# Patient Record
Sex: Female | Born: 1960 | Race: Black or African American | Hispanic: No | Marital: Single | Smoking: Never smoker
Health system: Southern US, Community
[De-identification: ages and names within clinical notes are randomized; demographics above are authoritative.]

## PROBLEM LIST (undated history)

## (undated) DIAGNOSIS — R17 Unspecified jaundice: Secondary | ICD-10-CM

## (undated) DIAGNOSIS — I341 Nonrheumatic mitral (valve) prolapse: Secondary | ICD-10-CM

## (undated) DIAGNOSIS — M797 Fibromyalgia: Secondary | ICD-10-CM

## (undated) DIAGNOSIS — Z973 Presence of spectacles and contact lenses: Secondary | ICD-10-CM

## (undated) DIAGNOSIS — K219 Gastro-esophageal reflux disease without esophagitis: Secondary | ICD-10-CM

## (undated) DIAGNOSIS — N950 Postmenopausal bleeding: Secondary | ICD-10-CM

## (undated) DIAGNOSIS — Z87442 Personal history of urinary calculi: Secondary | ICD-10-CM

## (undated) DIAGNOSIS — I2089 Other forms of angina pectoris: Secondary | ICD-10-CM

## (undated) DIAGNOSIS — M1990 Unspecified osteoarthritis, unspecified site: Secondary | ICD-10-CM

## (undated) DIAGNOSIS — I059 Rheumatic mitral valve disease, unspecified: Secondary | ICD-10-CM

## (undated) DIAGNOSIS — R011 Cardiac murmur, unspecified: Secondary | ICD-10-CM

## (undated) DIAGNOSIS — F419 Anxiety disorder, unspecified: Secondary | ICD-10-CM

## (undated) DIAGNOSIS — E785 Hyperlipidemia, unspecified: Secondary | ICD-10-CM

## (undated) DIAGNOSIS — Z972 Presence of dental prosthetic device (complete) (partial): Secondary | ICD-10-CM

## (undated) DIAGNOSIS — I10 Essential (primary) hypertension: Secondary | ICD-10-CM

## (undated) DIAGNOSIS — D649 Anemia, unspecified: Secondary | ICD-10-CM

## (undated) DIAGNOSIS — I208 Other forms of angina pectoris: Secondary | ICD-10-CM

## (undated) DIAGNOSIS — N858 Other specified noninflammatory disorders of uterus: Secondary | ICD-10-CM

## (undated) DIAGNOSIS — M21969 Unspecified acquired deformity of unspecified lower leg: Secondary | ICD-10-CM

## (undated) DIAGNOSIS — Z9889 Other specified postprocedural states: Secondary | ICD-10-CM

## (undated) HISTORY — PX: FOOT BONE EXCISION: SUR493

## (undated) HISTORY — PX: FOOT SURGERY: SHX648

## (undated) HISTORY — DX: Hyperlipidemia, unspecified: E78.5

## (undated) HISTORY — PX: DIAGNOSTIC LAPAROSCOPY: SUR761

## (undated) HISTORY — PX: COLONOSCOPY: SHX174

## (undated) HISTORY — DX: Cardiac murmur, unspecified: R01.1

---
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
## 1998-12-02 ENCOUNTER — Ambulatory Visit (HOSPITAL_COMMUNITY): Admission: RE | Admit: 1998-12-02 | Discharge: 1998-12-02 | Payer: Self-pay | Admitting: Neurosurgery

## 1998-12-21 ENCOUNTER — Encounter: Payer: Self-pay | Admitting: Neurosurgery

## 1998-12-21 ENCOUNTER — Ambulatory Visit (HOSPITAL_COMMUNITY): Admission: RE | Admit: 1998-12-21 | Discharge: 1998-12-21 | Payer: Self-pay | Admitting: Neurosurgery

## 1998-12-22 ENCOUNTER — Encounter: Payer: Self-pay | Admitting: Neurosurgery

## 1998-12-31 ENCOUNTER — Encounter: Admission: RE | Admit: 1998-12-31 | Discharge: 1999-03-31 | Payer: Self-pay | Admitting: Neurosurgery

## 1999-02-01 ENCOUNTER — Encounter: Admission: RE | Admit: 1999-02-01 | Discharge: 1999-02-01 | Payer: Self-pay | Admitting: Neurosurgery

## 1999-02-01 ENCOUNTER — Encounter: Payer: Self-pay | Admitting: Neurosurgery

## 1999-03-03 ENCOUNTER — Emergency Department (HOSPITAL_COMMUNITY): Admission: EM | Admit: 1999-03-03 | Discharge: 1999-03-03 | Payer: Self-pay | Admitting: Emergency Medicine

## 1999-04-15 ENCOUNTER — Encounter: Admission: RE | Admit: 1999-04-15 | Discharge: 1999-05-28 | Payer: Self-pay | Admitting: Neurosurgery

## 1999-05-01 ENCOUNTER — Emergency Department (HOSPITAL_COMMUNITY): Admission: EM | Admit: 1999-05-01 | Discharge: 1999-05-01 | Payer: Self-pay | Admitting: Emergency Medicine

## 1999-05-01 ENCOUNTER — Encounter: Payer: Self-pay | Admitting: Emergency Medicine

## 1999-05-23 ENCOUNTER — Encounter: Payer: Self-pay | Admitting: Nephrology

## 1999-05-23 ENCOUNTER — Encounter: Admission: RE | Admit: 1999-05-23 | Discharge: 1999-05-23 | Payer: Self-pay | Admitting: Nephrology

## 1999-06-06 ENCOUNTER — Encounter: Admission: RE | Admit: 1999-06-06 | Discharge: 1999-06-14 | Payer: Self-pay | Admitting: Specialist

## 2000-03-31 ENCOUNTER — Ambulatory Visit (HOSPITAL_BASED_OUTPATIENT_CLINIC_OR_DEPARTMENT_OTHER): Admission: RE | Admit: 2000-03-31 | Discharge: 2000-03-31 | Payer: Self-pay | Admitting: Orthopedic Surgery

## 2000-06-06 ENCOUNTER — Ambulatory Visit (HOSPITAL_COMMUNITY): Admission: RE | Admit: 2000-06-06 | Discharge: 2000-06-06 | Payer: Self-pay | Admitting: Neurosurgery

## 2000-06-06 ENCOUNTER — Encounter: Payer: Self-pay | Admitting: Neurosurgery

## 2000-07-06 ENCOUNTER — Encounter: Admission: RE | Admit: 2000-07-06 | Discharge: 2000-07-06 | Payer: Self-pay | Admitting: Neurosurgery

## 2000-07-06 ENCOUNTER — Encounter: Payer: Self-pay | Admitting: Neurosurgery

## 2000-07-20 ENCOUNTER — Encounter: Admission: RE | Admit: 2000-07-20 | Discharge: 2000-07-20 | Payer: Self-pay | Admitting: Neurosurgery

## 2000-07-20 ENCOUNTER — Encounter: Payer: Self-pay | Admitting: Neurosurgery

## 2000-09-17 ENCOUNTER — Ambulatory Visit (HOSPITAL_COMMUNITY): Admission: RE | Admit: 2000-09-17 | Discharge: 2000-09-17 | Payer: Self-pay | Admitting: Obstetrics

## 2000-09-17 ENCOUNTER — Encounter: Payer: Self-pay | Admitting: Obstetrics

## 2000-10-09 ENCOUNTER — Encounter: Payer: Self-pay | Admitting: Nephrology

## 2000-10-09 ENCOUNTER — Encounter: Admission: RE | Admit: 2000-10-09 | Discharge: 2000-10-09 | Payer: Self-pay | Admitting: Nephrology

## 2002-02-24 ENCOUNTER — Encounter: Admission: RE | Admit: 2002-02-24 | Discharge: 2002-02-24 | Payer: Self-pay | Admitting: *Deleted

## 2003-02-03 ENCOUNTER — Ambulatory Visit (HOSPITAL_COMMUNITY): Admission: RE | Admit: 2003-02-03 | Discharge: 2003-02-03 | Payer: Self-pay | Admitting: Cardiology

## 2003-02-21 ENCOUNTER — Ambulatory Visit: Admission: RE | Admit: 2003-02-21 | Discharge: 2003-02-21 | Payer: Self-pay | Admitting: Cardiology

## 2003-02-21 ENCOUNTER — Encounter (INDEPENDENT_AMBULATORY_CARE_PROVIDER_SITE_OTHER): Payer: Self-pay | Admitting: Cardiology

## 2005-07-29 ENCOUNTER — Encounter: Admission: RE | Admit: 2005-07-29 | Discharge: 2005-09-22 | Payer: Self-pay | Admitting: Orthopedic Surgery

## 2005-09-23 ENCOUNTER — Encounter: Admission: RE | Admit: 2005-09-23 | Discharge: 2005-09-23 | Payer: Self-pay | Admitting: Orthopedic Surgery

## 2005-10-14 ENCOUNTER — Encounter: Admission: RE | Admit: 2005-10-14 | Discharge: 2006-01-12 | Payer: Self-pay | Admitting: Orthopedic Surgery

## 2006-01-13 ENCOUNTER — Encounter: Admission: RE | Admit: 2006-01-13 | Discharge: 2006-03-03 | Payer: Self-pay | Admitting: Orthopedic Surgery

## 2007-11-24 ENCOUNTER — Encounter: Admission: RE | Admit: 2007-11-24 | Discharge: 2007-11-24 | Payer: Self-pay | Admitting: Gastroenterology

## 2008-01-14 HISTORY — PX: SHOULDER SURGERY: SHX246

## 2009-01-13 HISTORY — PX: JOINT REPLACEMENT: SHX530

## 2009-01-13 HISTORY — PX: CLOSED MANIPULATION KNEE WITH STERIOD INJECTION: SHX5610

## 2009-10-23 ENCOUNTER — Inpatient Hospital Stay (HOSPITAL_COMMUNITY): Admission: RE | Admit: 2009-10-23 | Discharge: 2009-10-27 | Payer: Self-pay | Admitting: Orthopedic Surgery

## 2009-11-20 ENCOUNTER — Encounter
Admission: RE | Admit: 2009-11-20 | Discharge: 2009-12-22 | Payer: Self-pay | Source: Home / Self Care | Attending: Orthopedic Surgery | Admitting: Orthopedic Surgery

## 2010-01-01 ENCOUNTER — Ambulatory Visit (HOSPITAL_COMMUNITY)
Admission: RE | Admit: 2010-01-01 | Discharge: 2010-01-01 | Payer: Self-pay | Source: Home / Self Care | Attending: Orthopedic Surgery | Admitting: Orthopedic Surgery

## 2010-01-02 ENCOUNTER — Encounter
Admission: RE | Admit: 2010-01-02 | Discharge: 2010-01-11 | Payer: Self-pay | Source: Home / Self Care | Attending: Orthopedic Surgery | Admitting: Orthopedic Surgery

## 2010-01-03 ENCOUNTER — Encounter
Admission: RE | Admit: 2010-01-03 | Discharge: 2010-02-12 | Disposition: A | Discharge status: Rehabilitation Facility | Payer: Self-pay | Source: Home / Self Care | Attending: Orthopedic Surgery | Admitting: Orthopedic Surgery

## 2010-01-13 HISTORY — PX: KNEE ARTHROPLASTY: SHX992

## 2010-01-22 ENCOUNTER — Inpatient Hospital Stay (HOSPITAL_COMMUNITY)
Admission: RE | Admit: 2010-01-22 | Discharge: 2010-01-24 | Payer: Self-pay | Source: Home / Self Care | Attending: Orthopedic Surgery | Admitting: Orthopedic Surgery

## 2010-01-24 ENCOUNTER — Encounter (INDEPENDENT_AMBULATORY_CARE_PROVIDER_SITE_OTHER): Payer: Self-pay | Admitting: Orthopedic Surgery

## 2010-01-24 ENCOUNTER — Encounter: Admit: 2010-01-24 | Payer: Self-pay | Admitting: Orthopedic Surgery

## 2010-01-28 ENCOUNTER — Encounter: Admit: 2010-01-28 | Payer: Self-pay | Admitting: Orthopedic Surgery

## 2010-01-28 LAB — BASIC METABOLIC PANEL
BUN: 14 mg/dL (ref 6–23)
CO2: 27 mEq/L (ref 19–32)
Calcium: 9.5 mg/dL (ref 8.4–10.5)
Chloride: 107 mEq/L (ref 96–112)
Creatinine, Ser: 0.65 mg/dL (ref 0.4–1.2)
GFR calc Af Amer: >60 mL/min (ref >60)
GFR calc non Af Amer: >60 mL/min (ref >60)
Glucose, Bld: 92 mg/dL (ref 70–99)
Potassium: 3.4 mEq/L — ABNORMAL LOW (ref 3.5–5.1)
Sodium: 139 mEq/L (ref 135–145)

## 2010-01-28 LAB — PROTIME-INR
INR: 1.04 (ref 0.00–1.49)
INR: 1.12 (ref 0.00–1.49)
INR: 1.87 — ABNORMAL HIGH (ref 0.00–1.49)
Prothrombin Time: 13.8 seconds (ref 11.6–15.2)
Prothrombin Time: 14.6 seconds (ref 11.6–15.2)
Prothrombin Time: 21.7 seconds — ABNORMAL HIGH (ref 11.6–15.2)

## 2010-01-28 LAB — TYPE AND SCREEN
ABO/RH(D): O POS
Antibody Screen: NEGATIVE

## 2010-02-01 ENCOUNTER — Encounter: Admit: 2010-02-01 | Payer: Self-pay | Admitting: Orthopedic Surgery

## 2010-02-13 ENCOUNTER — Ambulatory Visit: Payer: Medicaid Other | Attending: Orthopedic Surgery | Admitting: Physical Therapy

## 2010-02-13 DIAGNOSIS — M25569 Pain in unspecified knee: Secondary | ICD-10-CM | POA: Insufficient documentation

## 2010-02-13 DIAGNOSIS — R269 Unspecified abnormalities of gait and mobility: Secondary | ICD-10-CM | POA: Insufficient documentation

## 2010-02-13 DIAGNOSIS — IMO0001 Reserved for inherently not codable concepts without codable children: Secondary | ICD-10-CM | POA: Insufficient documentation

## 2010-02-13 DIAGNOSIS — M25669 Stiffness of unspecified knee, not elsewhere classified: Secondary | ICD-10-CM | POA: Insufficient documentation

## 2010-02-14 ENCOUNTER — Ambulatory Visit: Payer: Medicaid Other | Admitting: Physical Therapy

## 2010-02-15 ENCOUNTER — Ambulatory Visit: Payer: Medicaid Other | Admitting: Physical Therapy

## 2010-02-18 ENCOUNTER — Ambulatory Visit: Payer: Medicaid Other | Admitting: Physical Therapy

## 2010-02-19 ENCOUNTER — Ambulatory Visit: Payer: Medicaid Other | Admitting: Physical Therapy

## 2010-02-21 ENCOUNTER — Ambulatory Visit: Payer: Medicaid Other | Admitting: Physical Therapy

## 2010-02-21 NOTE — Discharge Summary (Signed)
  NAMEJOLA, [name] NO.:  [id number]  MEDICAL RECORD NO.:  [id number]          PATIENT TYPE:  INP  LOCATION:  5004                         FACILITY:  [facility]  PHYSICIAN:  Burnard Bunting, M.D.    DATE OF BIRTH:  03/30/1960  DATE OF ADMISSION:  01/22/2010 DATE OF DISCHARGE:  01/24/2010                              DISCHARGE SUMMARY   DISCHARGE DIAGNOSIS:  Right knee arthrofibrosis.  PROCEDURES:  Right knee synovectomy.  Open synovectomy performed on December 2012.  HOSPITAL COURSE:  Sandra Church is a 50 year old patient with right knee replacement, arthrofibrosis, underwent open synovectomy.  She tolerated the procedure well without immediate complications.  She was started on Coumadin for DVT prophylaxis and postsurgery, she made good progress with physical therapy and CPM machine.  Discharged home in good condition on January 24, 2010.  Doppler was negative for DVT at that time.  DISCHARGE MEDICATIONS: 1. Lyrica 100 mg p.o. daily. 2. Lisinopril 20 mg p.o. daily. 3. Ferrous sulfate 1 tablet daily. 4. Metoprolol 100 mg p.o. q. evening. 5. Omeprazole 40 mg p.o. daily. 6. Aspirin 325 by mouth daily. 7. Hydrocodone 1 tablet as needed for pain q.4-6 h. 8. Robaxin 500 mg p.o. q.8 h. as needed for muscle spasm.  She will not go home on Coumadin, but she is ambulating, weightbearing as tolerated, and her ultrasound was negative.  She will follow up with me in 7 days for suture removal.     Burnard Bunting, M.D.     GSD/MEDQ  D:  02/13/2010  T:  02/14/2010  Job:  [id number]  Electronically Signed by Reece Agar.  Alfonse Garringer M.D. on 02/21/2010 09:00:38 AM

## 2010-02-25 ENCOUNTER — Ambulatory Visit: Payer: Medicaid Other | Admitting: Physical Therapy

## 2010-02-27 ENCOUNTER — Ambulatory Visit: Payer: Medicaid Other | Admitting: Physical Therapy

## 2010-02-27 ENCOUNTER — Ambulatory Visit: Payer: Self-pay | Admitting: Physical Therapy

## 2010-03-01 ENCOUNTER — Ambulatory Visit: Payer: Medicaid Other | Admitting: Physical Therapy

## 2010-03-04 ENCOUNTER — Ambulatory Visit: Payer: Medicaid Other | Admitting: Physical Therapy

## 2010-03-06 ENCOUNTER — Ambulatory Visit: Payer: Medicaid Other | Admitting: Physical Therapy

## 2010-03-06 ENCOUNTER — Ambulatory Visit: Payer: Self-pay | Admitting: Physical Therapy

## 2010-03-08 ENCOUNTER — Ambulatory Visit: Payer: Medicaid Other | Admitting: Physical Therapy

## 2010-03-11 ENCOUNTER — Ambulatory Visit: Payer: Medicaid Other | Admitting: Physical Therapy

## 2010-03-13 ENCOUNTER — Ambulatory Visit: Payer: Medicaid Other | Admitting: Physical Therapy

## 2010-03-15 ENCOUNTER — Encounter: Payer: Self-pay | Admitting: Physical Therapy

## 2010-03-15 ENCOUNTER — Ambulatory Visit: Payer: Self-pay | Admitting: Physical Therapy

## 2010-03-18 ENCOUNTER — Encounter: Payer: Self-pay | Admitting: Physical Therapy

## 2010-03-20 ENCOUNTER — Encounter: Payer: Self-pay | Admitting: Physical Therapy

## 2010-03-25 LAB — CBC
HCT: 37.2 % (ref 36.0–46.0)
Hemoglobin: 12 g/dL (ref 12.0–15.0)
MCH: 28.2 pg (ref 26.0–34.0)
MCHC: 32.3 g/dL (ref 30.0–36.0)
MCV: 87.3 fL (ref 78.0–100.0)
Platelets: 492 10*3/uL — ABNORMAL HIGH (ref 150–400)
RBC: 4.26 MIL/uL (ref 3.87–5.11)
RDW: 15.4 % (ref 11.5–15.5)
WBC: 7.9 10*3/uL (ref 4.0–10.5)

## 2010-03-25 LAB — BASIC METABOLIC PANEL WITH GFR
BUN: 14 mg/dL (ref 6–23)
CO2: 27 meq/L (ref 19–32)
Calcium: 9.5 mg/dL (ref 8.4–10.5)
Chloride: 104 meq/L (ref 96–112)
Creatinine, Ser: 0.72 mg/dL (ref 0.4–1.2)
GFR calc non Af Amer: >60 mL/min
Glucose, Bld: 96 mg/dL (ref 70–99)
Potassium: 4.1 meq/L (ref 3.5–5.1)
Sodium: 138 meq/L (ref 135–145)

## 2010-03-25 LAB — HEPATIC FUNCTION PANEL
ALT: 11 U/L (ref 0–35)
AST: 20 U/L (ref 0–37)
Albumin: 3.6 g/dL (ref 3.5–5.2)
Alkaline Phosphatase: 78 U/L (ref 39–117)
Bilirubin, Direct: 0.1 mg/dL (ref 0.0–0.3)
Indirect Bilirubin: 0.4 mg/dL (ref 0.3–0.9)
Total Bilirubin: 0.5 mg/dL (ref 0.3–1.2)
Total Protein: 6.4 g/dL (ref 6.0–8.3)

## 2010-03-25 LAB — APTT: aPTT: 32 seconds (ref 24–37)

## 2010-03-27 LAB — PROTIME-INR
INR: 2.51 — ABNORMAL HIGH (ref 0.00–1.49)
Prothrombin Time: 27.2 seconds — ABNORMAL HIGH (ref 11.6–15.2)

## 2010-03-28 LAB — BASIC METABOLIC PANEL
BUN: 4 mg/dL — ABNORMAL LOW (ref 6–23)
CO2: 27 mEq/L (ref 19–32)
CO2: 30 mEq/L (ref 19–32)
Calcium: 8.4 mg/dL (ref 8.4–10.5)
Calcium: 9.1 mg/dL (ref 8.4–10.5)
Calcium: 9.7 mg/dL (ref 8.4–10.5)
Creatinine, Ser: 0.62 mg/dL (ref 0.4–1.2)
Creatinine, Ser: 0.65 mg/dL (ref 0.4–1.2)
Creatinine, Ser: 0.69 mg/dL (ref 0.4–1.2)
GFR calc Af Amer: >60 mL/min (ref >60)
GFR calc Af Amer: >60 mL/min (ref >60)
GFR calc non Af Amer: >60 mL/min (ref >60)
GFR calc non Af Amer: >60 mL/min (ref >60)
Glucose, Bld: 92 mg/dL (ref 70–99)

## 2010-03-28 LAB — URINE CULTURE
Colony Count: NO GROWTH
Culture  Setup Time: 201110061708

## 2010-03-28 LAB — PROTIME-INR
INR: 0.98 (ref 0.00–1.49)
INR: 1.27 (ref 0.00–1.49)
Prothrombin Time: 13.2 seconds (ref 11.6–15.2)
Prothrombin Time: 21.6 seconds — ABNORMAL HIGH (ref 11.6–15.2)

## 2010-03-28 LAB — CBC
HCT: 28.7 % — ABNORMAL LOW (ref 36.0–46.0)
HCT: 29.4 % — ABNORMAL LOW (ref 36.0–46.0)
Hemoglobin: 9.9 g/dL — ABNORMAL LOW (ref 12.0–15.0)
MCH: 31.4 pg (ref 26.0–34.0)
MCH: 31.6 pg (ref 26.0–34.0)
MCHC: 33.6 g/dL (ref 30.0–36.0)
MCV: 93 fL (ref 78.0–100.0)
MCV: 94.4 fL (ref 78.0–100.0)
MCV: 94.4 fL (ref 78.0–100.0)
Platelets: 310 10*3/uL (ref 150–400)
Platelets: 332 10*3/uL (ref 150–400)
Platelets: 402 10*3/uL — ABNORMAL HIGH (ref 150–400)
RBC: 3.04 MIL/uL — ABNORMAL LOW (ref 3.87–5.11)
RBC: 3.16 MIL/uL — ABNORMAL LOW (ref 3.87–5.11)
RBC: 3.38 MIL/uL — ABNORMAL LOW (ref 3.87–5.11)
RDW: 14.8 % (ref 11.5–15.5)
WBC: 14.7 10*3/uL — ABNORMAL HIGH (ref 4.0–10.5)
WBC: 14.8 10*3/uL — ABNORMAL HIGH (ref 4.0–10.5)
WBC: 15.4 10*3/uL — ABNORMAL HIGH (ref 4.0–10.5)

## 2010-03-28 LAB — URINALYSIS, ROUTINE W REFLEX MICROSCOPIC
Bilirubin Urine: NEGATIVE
Bilirubin Urine: NEGATIVE
Ketones, ur: NEGATIVE mg/dL
Ketones, ur: NEGATIVE mg/dL
Nitrite: NEGATIVE
Protein, ur: NEGATIVE mg/dL
Protein, ur: NEGATIVE mg/dL
Urobilinogen, UA: 1 mg/dL (ref 0.0–1.0)
pH: 7 (ref 5.0–8.0)

## 2010-03-28 LAB — SURGICAL PCR SCREEN: Staphylococcus aureus: POSITIVE — AB

## 2010-03-28 LAB — DIFFERENTIAL
Basophils Absolute: 0.1 10*3/uL (ref 0.0–0.1)
Basophils Relative: 1 % (ref 0–1)
Eosinophils Absolute: 0.4 10*3/uL (ref 0.0–0.7)
Eosinophils Relative: 3 % (ref 0–5)
Lymphocytes Relative: 16 % (ref 12–46)
Lymphs Abs: 2.5 10*3/uL (ref 0.7–4.0)
Monocytes Absolute: 1.4 10*3/uL — ABNORMAL HIGH (ref 0.1–1.0)
Neutro Abs: 11.1 10*3/uL — ABNORMAL HIGH (ref 1.7–7.7)
Neutrophils Relative %: 55 % (ref 43–77)

## 2010-03-28 LAB — TYPE AND SCREEN
ABO/RH(D): O POS
Antibody Screen: NEGATIVE

## 2010-03-28 LAB — ABO/RH: ABO/RH(D): O POS

## 2010-05-31 NOTE — Op Note (Signed)
. [facility]  Patient:    Sandra Church, Sandra Church                  MRN: [id number] Proc. Date: 03/31/00 Adm. Date:  [id number] Attending:  Twana First                           Operative Report  PREOPERATIVE DIAGNOSIS: Right knee medial and lateral meniscal tears.  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  OPERATION/PROCEDURE:  1. Right knee examination under anesthesia followed by arthroscopic partial     medial and lateral meniscectomy.  2. Right knee chondroplasty.  SURGEON: Elana Alm. Thurston Hole, M.D.  ASSISTANT: Kirstin A. Shepperson, P.A.  ANESTHESIA: Local and MAC.  OPERATIVE TIME: Thirty minutes.  COMPLICATIONS: None.  INDICATIONS FOR PROCEDURE: Sandra Church is a 50 year old woman who has had three months of increasing right knee pain, with signs and symptoms consistent with medial and lateral meniscal tears, documented by MRI, who has failed conservative care and is now to undergo arthroscopy.  DESCRIPTION OF PROCEDURE: Sandra Church was brought to the operating room on March 31, 2000 and placed on the operating room table in the supine position. A block had been placed in the holding room.  Her right knee was examined under anesthesia and range of motion was 0-125 degrees, with 1-2+ crepitation noted.  The knee was stable to ligamentous examination with normal patella tracking.  The right leg was prepped using sterile Betadine and draped using sterile technique.  Originally through an inferolateral portal the arthroscope with a pump attached was placed and through an inferomedial portal an arthroscopic probe was placed.  On initial inspection of the medial compartment the articular cartilage and medial femoral condyle showed a central grade 3 chondral defect of over 50% of the medial femoral condyle, which was thoroughly debrided down to a stable base and a stable rim.  The medial  tibial plateau showed moderate grade 2 and 30% grade 3 chondromalacia, which was debrided.  The medial meniscus was probed and there was small partial tearing noted, which was debrided 15%; otherwise, it was intact.  The intercondylar notch was inspected and the anterior and posterior cruciate ligaments were normal.  The lateral compartment was inspected and grade 3 chondromalacia over 50-60% was noted in the lateral femoral condyle and 80% of the lateral tibial plateau, which was thoroughly debrided.  She had complex tearing of the posterior and lateral horn and lateral meniscus, of which 50-60% was resected back to a stable rim.  The patellofemoral joint was inspected and grade 3 chondromalacia was noted of over 25% of the patella, which was debrided; otherwise, grade 1-2 changes noted on the rest of the patella and the femoral groove.  The patella tracked normally.  Moderate synovitis in the medial and lateral gutters was debrided, otherwise they were free of pathology.  After this was done it was felt all pathology had been satisfactorily addressed.  Instruments were removed and the portals closed with 3-0 Nylon suture and injected with 0.25% Marcaine with epinephrine and 4 mg morphine.  A sterile dressing was applied and the patient taken to the recovery room in stable condition.  FOLLOW-UP CARE: Sandra Church will be followed as an outpatient on Vicodin and Naprosyn and Keflex and I will see her back in the office in a week for suture removal and follow-up. DD:  03/31/00 TD:  04/01/00 Job: [id number]  [id number]

## 2010-11-04 ENCOUNTER — Other Ambulatory Visit (HOSPITAL_COMMUNITY): Payer: Self-pay | Admitting: Obstetrics and Gynecology

## 2010-11-04 DIAGNOSIS — Z1231 Encounter for screening mammogram for malignant neoplasm of breast: Secondary | ICD-10-CM

## 2010-11-25 ENCOUNTER — Ambulatory Visit (HOSPITAL_COMMUNITY)
Admission: RE | Admit: 2010-11-25 | Discharge: 2010-11-25 | Disposition: A | Discharge status: Home / Self Care | Payer: Medicaid Other | Source: Ambulatory Visit | Attending: Obstetrics and Gynecology | Admitting: Obstetrics and Gynecology

## 2010-11-25 DIAGNOSIS — Z1231 Encounter for screening mammogram for malignant neoplasm of breast: Secondary | ICD-10-CM

## 2010-12-18 ENCOUNTER — Other Ambulatory Visit: Payer: Self-pay | Admitting: Cardiology

## 2011-10-14 ENCOUNTER — Emergency Department (HOSPITAL_COMMUNITY): Payer: Medicaid Other

## 2011-10-14 ENCOUNTER — Emergency Department (HOSPITAL_COMMUNITY)
Admission: EM | Admit: 2011-10-14 | Discharge: 2011-10-14 | Disposition: A | Discharge status: Home / Self Care | Payer: Medicaid Other | Attending: Emergency Medicine | Admitting: Emergency Medicine

## 2011-10-14 ENCOUNTER — Encounter (HOSPITAL_COMMUNITY): Payer: Self-pay | Admitting: Family Medicine

## 2011-10-14 DIAGNOSIS — I10 Essential (primary) hypertension: Secondary | ICD-10-CM | POA: Insufficient documentation

## 2011-10-14 DIAGNOSIS — Y92009 Unspecified place in unspecified non-institutional (private) residence as the place of occurrence of the external cause: Secondary | ICD-10-CM | POA: Insufficient documentation

## 2011-10-14 DIAGNOSIS — W2203XA Walked into furniture, initial encounter: Secondary | ICD-10-CM | POA: Insufficient documentation

## 2011-10-14 DIAGNOSIS — S92919A Unspecified fracture of unspecified toe(s), initial encounter for closed fracture: Secondary | ICD-10-CM | POA: Insufficient documentation

## 2011-10-14 DIAGNOSIS — S8990XA Unspecified injury of unspecified lower leg, initial encounter: Secondary | ICD-10-CM | POA: Insufficient documentation

## 2011-10-14 HISTORY — DX: Fibromyalgia: M79.7

## 2011-10-14 HISTORY — DX: Rheumatic mitral valve disease, unspecified: I05.9

## 2011-10-14 HISTORY — DX: Unspecified osteoarthritis, unspecified site: M19.90

## 2011-10-14 HISTORY — DX: Essential (primary) hypertension: I10

## 2011-10-14 MED ORDER — HYDROCODONE-ACETAMINOPHEN 5-500 MG PO TABS: 1.0000 | ORAL_TABLET | Freq: Four times a day (QID) | ORAL | Status: DC | PRN

## 2011-10-14 MED ORDER — HYDROCODONE-ACETAMINOPHEN 5-325 MG PO TABS
2.0000 | ORAL_TABLET | Freq: Once | ORAL | Status: AC
  Administered 2011-10-14: 2 via ORAL
  Filled 2011-10-14: qty 2

## 2011-10-14 NOTE — ED Provider Notes (Signed)
History     CSN: [id number]  Arrival date & time 10/14/11  2049   First MD Initiated Contact with Patient 10/14/11 2237      Chief Complaint  Patient presents with  . Foot Injury    (Consider location/radiation/quality/duration/timing/severity/associated sxs/prior treatment) Patient is a 51 y.o. female presenting with foot injury. The history is provided by the patient.  Foot Injury  Pertinent negatives include no numbness.  pt c/o left 4th toe injury tonight at home. Was walking, says left foot slid out hitting leg of table. C/o constant, dull, non radiating pain since, worse w palpation. No associated numbness. Skin intact. Denies other injury. Is ambulatory.      Past Medical History  Diagnosis Date  . Fibromyalgia   . Arthritis   . Hypertension   . Mitral valve problem     Past Surgical History  Procedure Date  . Foot surgery     plantar fascitis  . Joint replacement   . Shoulder surgery     History reviewed. No pertinent family history.  History  Substance Use Topics  . Smoking status: Never Smoker   . Smokeless tobacco: Not on file  . Alcohol Use: No    OB History    Grav Para Term Preterm Abortions TAB SAB Ect Mult Living                  Review of Systems  Constitutional: Negative for fever.  Skin: Negative for wound.  Neurological: Negative for numbness.    Allergies  Shrimp and Codeine  Home Medications   Current Outpatient Rx  Name Route Sig Dispense Refill  . ASPIRIN-SALICYLAMIDE-CAFFEINE 295-284-13 MG PO PACK Oral Take 1 packet by mouth every 8 (eight) hours as needed. For pain    . LISINOPRIL 5 MG PO TABS Oral Take 5 mg by mouth daily.    Marland Kitchen METOPROLOL SUCCINATE ER 100 MG PO TB24 Oral Take 100 mg by mouth daily. Take with or immediately following a meal.    . PREGABALIN 150 MG PO CAPS Oral Take 150 mg by mouth 3 (three) times daily.      BP 124/73  Pulse 74  Temp 97.7 F (36.5 C) (Oral)  Resp 20  SpO2 100%  Physical Exam    Nursing note and vitals reviewed. Constitutional: She appears well-developed and well-nourished. No distress.  Eyes: Conjunctivae normal are normal. No scleral icterus.  Neck: Neck supple. No tracheal deviation present.  Cardiovascular: Normal rate.   Pulmonary/Chest: Effort normal. No respiratory distress.  Abdominal: Normal appearance.  Musculoskeletal:       sts abd tenderness left 4th toe. Skin intact. Normal cap refill distally. No other focal bony tenderness on foot and ankle exam. Dp/pt 2+.  Neurological: She is alert.  Skin: Skin is warm and dry. No rash noted.  Psychiatric: She has a normal mood and affect.    ED Course  Procedures (including critical care time)  Labs Reviewed - No data to display Dg Foot Complete Left  10/14/2011  *RADIOLOGY REPORT*  Clinical Data: Foot injury with fourth toe pain.  LEFT FOOT - COMPLETE 3+ VIEW  Comparison: None.  Findings: Oblique fracture is identified through the proximal phalanx of the fourth toe.  This is not appear to extend to an articular surface in the mid and.  Deformity of the little toe proximal phalangeal head suggests old trauma.  There is mild hallux valgus deformity.  IMPRESSION: Oblique fracture involving the proximal phalanx of the fourth toe.  Original Report Authenticated By: ERIC A. MANSELL, M.D.        MDM  No meds pta, pt has ride, does not have to drive.  vicodin po.  Ice.   Post op shoe.  Discussed xrays w pt.         Suzi Roots, MD 10/14/11 2251

## 2011-10-14 NOTE — ED Notes (Signed)
Pt states she was walking in her hallway and lost her balance and left foot went out from under her and ran into metal table. Small bruised area noted above left 4th toe. Reports pain to that area. CMS intact.

## 2012-07-01 ENCOUNTER — Encounter (HOSPITAL_COMMUNITY): Payer: Self-pay | Admitting: Emergency Medicine

## 2012-07-01 ENCOUNTER — Emergency Department (HOSPITAL_COMMUNITY): Payer: Medicaid Other

## 2012-07-01 ENCOUNTER — Emergency Department (HOSPITAL_COMMUNITY)
Admission: EM | Admit: 2012-07-01 | Discharge: 2012-07-01 | Disposition: A | Discharge status: Home / Self Care | Payer: Medicaid Other | Attending: Emergency Medicine | Admitting: Emergency Medicine

## 2012-07-01 DIAGNOSIS — S61209A Unspecified open wound of unspecified finger without damage to nail, initial encounter: Secondary | ICD-10-CM | POA: Insufficient documentation

## 2012-07-01 DIAGNOSIS — Y9289 Other specified places as the place of occurrence of the external cause: Secondary | ICD-10-CM | POA: Insufficient documentation

## 2012-07-01 DIAGNOSIS — S66909A Unspecified injury of unspecified muscle, fascia and tendon at wrist and hand level, unspecified hand, initial encounter: Secondary | ICD-10-CM | POA: Insufficient documentation

## 2012-07-01 DIAGNOSIS — S62339A Displaced fracture of neck of unspecified metacarpal bone, initial encounter for closed fracture: Secondary | ICD-10-CM | POA: Insufficient documentation

## 2012-07-01 DIAGNOSIS — I059 Rheumatic mitral valve disease, unspecified: Secondary | ICD-10-CM | POA: Insufficient documentation

## 2012-07-01 DIAGNOSIS — Y9389 Activity, other specified: Secondary | ICD-10-CM | POA: Insufficient documentation

## 2012-07-01 DIAGNOSIS — S61412A Laceration without foreign body of left hand, initial encounter: Secondary | ICD-10-CM

## 2012-07-01 DIAGNOSIS — IMO0001 Reserved for inherently not codable concepts without codable children: Secondary | ICD-10-CM | POA: Insufficient documentation

## 2012-07-01 DIAGNOSIS — Z79899 Other long term (current) drug therapy: Secondary | ICD-10-CM | POA: Insufficient documentation

## 2012-07-01 DIAGNOSIS — I10 Essential (primary) hypertension: Secondary | ICD-10-CM | POA: Insufficient documentation

## 2012-07-01 DIAGNOSIS — S61409A Unspecified open wound of unspecified hand, initial encounter: Secondary | ICD-10-CM | POA: Insufficient documentation

## 2012-07-01 DIAGNOSIS — Z8739 Personal history of other diseases of the musculoskeletal system and connective tissue: Secondary | ICD-10-CM | POA: Insufficient documentation

## 2012-07-01 DIAGNOSIS — S62309A Unspecified fracture of unspecified metacarpal bone, initial encounter for closed fracture: Secondary | ICD-10-CM

## 2012-07-01 DIAGNOSIS — W208XXA Other cause of strike by thrown, projected or falling object, initial encounter: Secondary | ICD-10-CM | POA: Insufficient documentation

## 2012-07-01 MED ORDER — OXYCODONE-ACETAMINOPHEN 5-325 MG PO TABS: 1.0000 | ORAL_TABLET | ORAL | Status: DC | PRN

## 2012-07-01 MED ORDER — CEPHALEXIN 500 MG PO CAPS: 500.0000 mg | ORAL_CAPSULE | Freq: Four times a day (QID) | ORAL | Status: DC

## 2012-07-01 MED ORDER — TETANUS-DIPHTH-ACELL PERTUSSIS 5-2.5-18.5 LF-MCG/0.5 IM SUSP
0.5000 mL | Freq: Once | INTRAMUSCULAR | Status: AC
  Administered 2012-07-01: 0.5 mL via INTRAMUSCULAR
  Filled 2012-07-01: qty 0.5

## 2012-07-01 MED ORDER — OXYCODONE-ACETAMINOPHEN 5-325 MG PO TABS
2.0000 | ORAL_TABLET | Freq: Once | ORAL | Status: AC
  Administered 2012-07-01: 2 via ORAL
  Filled 2012-07-01: qty 2

## 2012-07-01 NOTE — ED Notes (Signed)
Blunt trauma to l/hand -5th finger. Open laceration, muscle exposed. Incident one hour ago. Slow bleeding. Pressure and ice applied.TV dropped from wall bracket -approx. 8 foot drop onto back of hand

## 2012-07-01 NOTE — ED Notes (Signed)
Patient transported to X-ray 

## 2012-07-01 NOTE — ED Provider Notes (Signed)
History  This chart was scribed for Dierdre Forth, PA-C working with Lyanne Co, MD by Ardelia Mems, ED Scribe. This patient was seen in room WTR5/WTR5 and the patient's care was started at 3:25 PM.   CSN: [id number]  Arrival date & time 07/01/12  1432      Chief Complaint  Patient presents with  . Extremity Laceration    blunt trauma to l/hand-5th finger  . Finger Injury    deep laceration to 5th finger l/hand  . Bleeding/Bruising    slow bleed from laceration     HPI  HPI Comments: Sandra Church is a 52 y.o. female with a hx of arthritis and HTN who presents to the Emergency Department complaining of injuries onset after a TV fell on her left hand about 2 hours ago. Pt is right-handed. Pt states that she was lying on the floor in a camper, when a TV mounted on the wall fell about 8 feet onto the dorsal aspect of her left hand. Pt complains of constant, moderate pain with associated swelling in the 3rd, 4th and 5th digits of her left hand. Pt states that the impact of the TV on her hand damaged a ring she was wearing on her ring finger, and she had to cut off the ring. Pt has lacerations in the webspace of her 4th and 5th digits and over the PIP joint of her 4th finger, with initial, slow bleeding which is under control currently. Pt also states that the impact dislocated her 5th metacarpal, which she relocated afterwards. Pt states that she has iced the hand with some relief of swelling. Pt has not tried any home medications for pain, but received Oxycodone soon after arrival and reports relief of pain. Pt denies wrist pain, fever, chills, sweating, numbness or any other symptoms.   PCP- Dr. Lerry Liner   Past Medical History  Diagnosis Date  . Fibromyalgia   . Arthritis   . Hypertension   . Mitral valve problem     Past Surgical History  Procedure Laterality Date  . Foot surgery      plantar fascitis  . Joint replacement    . Shoulder surgery       Family History  Problem Relation Age of Onset  . Diabetes Mother   . Stroke Mother   . Cancer Father   . Hypertension Father     History  Substance Use Topics  . Smoking status: Never Smoker   . Smokeless tobacco: Not on file  . Alcohol Use: No    OB History   Grav Para Term Preterm Abortions TAB SAB Ect Mult Living                  Review of Systems  Constitutional: Negative for fever and chills.  Musculoskeletal:       Denies wrist pain.  Neurological: Negative for numbness.   A complete 10 system review of systems was obtained and all systems are negative except as noted in the HPI and PMH.    Allergies  Shrimp and Codeine  Home Medications   Current Outpatient Rx  Name  Route  Sig  Dispense  Refill  . Aspirin-Salicylamide-Caffeine (BC FAST PAIN RELIEF ARTHRITIS) [id number] MG PACK   Oral   Take 1 packet by mouth every 8 (eight) hours as needed. For pain         . lisinopril (PRINIVIL,ZESTRIL) 5 MG tablet   Oral   Take 10 mg by mouth 2 (  two) times daily.          . metoprolol succinate (TOPROL-XL) 100 MG 24 hr tablet   Oral   Take 100 mg by mouth every morning. Take with or immediately following a meal.         . pregabalin (LYRICA) 100 MG capsule   Oral   Take 100 mg by mouth 3 (three) times daily.         . cephALEXin (KEFLEX) 500 MG capsule   Oral   Take 1 capsule (500 mg total) by mouth 4 (four) times daily.   40 capsule   0   . oxyCODONE-acetaminophen (PERCOCET/ROXICET) 5-325 MG per tablet   Oral   Take 1-2 tablets by mouth every 4 (four) hours as needed for pain.   21 tablet   0     Triage Vitals: BP 146/86  Pulse 87  Temp(Src) 97.9 F (36.6 C) (Oral)  Resp 20  SpO2 100%  Physical Exam  Nursing note and vitals reviewed. Constitutional: She is oriented to person, place, and time. She appears well-developed and well-nourished. No distress.  HENT:  Head: Normocephalic and atraumatic.  Mouth/Throat: Oropharynx is clear  and moist.  Eyes: Conjunctivae are normal. Pupils are equal, round, and reactive to light. No scleral icterus.  Neck: Normal range of motion.  Cardiovascular: Normal rate, regular rhythm, normal heart sounds and intact distal pulses.   No murmur heard. Pulses:      Radial pulses are 2+ on the right side, and 2+ on the left side.  Capillary refill > 3 in all 5 digits of the L hand  Pulmonary/Chest: Effort normal and breath sounds normal. No respiratory distress. She has no wheezes.  Musculoskeletal: She exhibits no edema.       Left hand: She exhibits decreased range of motion, tenderness, bony tenderness, deformity, laceration and swelling. She exhibits normal two-point discrimination and normal capillary refill. Normal sensation noted.  LEFT HAND: Limited ROM in 5th, 4th, 3rd digits. Full ROM of left wrist. Radial and ulnar nerve are intact. Inability to extend against resistance with the left little finger   Neurological: She is alert and oriented to person, place, and time. She exhibits normal muscle tone. Coordination normal. GCS eye subscore is 4. GCS verbal subscore is 5. GCS motor subscore is 6.  Skin: Skin is warm and dry. She is not diaphoretic. No erythema.  LEFT HAND: 3 cm laceration over PIP joint of 4th finger. 2.5 cm laceration in webspace bt 4th and 5th digits. Hematoma and erythema along dorsal aspect of hand, with a large amount of swelling.  Psychiatric: She has a normal mood and affect.    ED Course  LACERATION REPAIR Date/Time: 07/01/2012 3:30 PM Performed by: Dierdre Forth Authorized by: Dierdre Forth Consent: Verbal consent obtained. Risks and benefits: risks, benefits and alternatives were discussed Consent given by: patient Patient understanding: patient states understanding of the procedure being performed Patient consent: the patient's understanding of the procedure matches consent given Procedure consent: procedure consent matches procedure  scheduled Relevant documents: relevant documents present and verified Site marked: the operative site was marked Imaging studies: imaging studies available Required items: required blood products, implants, devices, and special equipment available Patient identity confirmed: verbally with patient and arm band Time out: Immediately prior to procedure a "time out" was called to verify the correct patient, procedure, equipment, support staff and site/side marked as required. Body area: upper extremity Location details: left small finger Laceration length: 3 cm Foreign bodies:  no foreign bodies Tendon involvement: complex (complete extensor tendon rupture) Nerve involvement: none Vascular damage: no Anesthesia: digital block Local anesthetic: lidocaine 1% without epinephrine Anesthetic total: 5 ml Patient sedated: no Preparation: Patient was prepped and draped in the usual sterile fashion. Irrigation solution: saline and tap water Irrigation method: syringe and tap Amount of cleaning: extensive Debridement: minimal Wound skin closure material used: 5-0 vicryl rapide. Number of sutures: 7 Technique: running Approximation: close Approximation difficulty: complex Dressing: 4x4 sterile gauze Patient tolerance: Patient tolerated the procedure well with no immediate complications.  LACERATION REPAIR Date/Time: 07/01/2012 4:00 PM Performed by: Dierdre Forth Authorized by: Dierdre Forth Consent: Verbal consent obtained. Risks and benefits: risks, benefits and alternatives were discussed Consent given by: patient Patient understanding: patient states understanding of the procedure being performed Patient consent: the patient's understanding of the procedure matches consent given Procedure consent: procedure consent matches procedure scheduled Relevant documents: relevant documents present and verified Site marked: the operative site was marked Imaging studies: imaging  studies available Required items: required blood products, implants, devices, and special equipment available Patient identity confirmed: verbally with patient and arm band Time out: Immediately prior to procedure a "time out" was called to verify the correct patient, procedure, equipment, support staff and site/side marked as required. Body area: upper extremity Location details: left hand Laceration length: 2.5 cm Foreign bodies: no foreign bodies Tendon involvement: none Nerve involvement: none Vascular damage: no Anesthesia: local infiltration Local anesthetic: lidocaine 1% without epinephrine Anesthetic total: 3 ml Patient sedated: no Irrigation solution: saline and tap water Irrigation method: syringe and tap Amount of cleaning: extensive Debridement: minimal Degree of undermining: none Wound skin closure material used: 5-0 vicryl rapide. Number of sutures: 4 Technique: simple Approximation: close Approximation difficulty: complex Dressing: 4x4 sterile gauze Patient tolerance: Patient tolerated the procedure well with no immediate complications.   (including critical care time)  DIAGNOSTIC STUDIES: Oxygen Saturation is 100% on RA, normal by my interpretation.    COORDINATION OF CARE: 3:43 PM- Pt advised of plan for treatment and pt agrees.  Medications  oxyCODONE-acetaminophen (PERCOCET/ROXICET) 5-325 MG per tablet 2 tablet (2 tablets Oral Given 07/01/12 1527)  TDaP (BOOSTRIX) injection 0.5 mL (0.5 mLs Intramuscular Given 07/01/12 1719)     Labs Reviewed - No data to display Dg Hand Complete Left  07/01/2012   *RADIOLOGY REPORT*  Clinical Data: Recent traumatic injury with pain  LEFT HAND - COMPLETE 3+ VIEW  Comparison: None.  Findings: A mildly displaced distal fifth metacarpal fracture is seen.  Soft tissue injury is noted in the midportion of the fifth digit.  Generalized swelling is noted.  No other fracture is seen.  IMPRESSION: Fifth metacarpal fracture with  associated soft tissue swelling as well as soft tissue defect in the fifth finger consistent with known history.   Original Report Authenticated By: Alcide Clever, M.D.     1. Laceration of left hand involving tendon   2. Fracture of metacarpal of left hand, closed, initial encounter       MDM  Sandra Church presents with traumatic injury to the L hand.  X-ray with Fifth metacarpal fracture.  I personally reviewed the imaging tests through PACS system.  I reviewed available ER/hospitalization records through the EMR.  Exam with evidence of complete disruption of the extensor tendon in the left little finger, but no obvious involvement of the joint capsule.  Discussed with Dr Merlyn Lot who recommends repair of the laceration and follow-up in the office tomorrow.  Tdap booster  given. Pressure irrigation performed. Laceration occurred < 8 hours prior to repair which was well tolerated.  Pt has no co morbidities to effect normal wound healing. Discussed suture home care w pt and answered questions. Pt to f-u tomorrow with Merlyn Lot for extensor tendon repair.  Pt is hemodynamically stable w no complaints.  Fracture splinted in the department.  I have also discussed reasons to return immediately to the ER.  Patient expresses understanding and agrees with plan.   I personally performed the services described in this documentation, which was scribed in my presence. The recorded information has been reviewed and is accurate.   Dahlia Client Sayde Lish, PA-C 07/01/12 2046  Dierdre Forth, PA-C 07/01/12 2223

## 2012-07-01 NOTE — ED Provider Notes (Signed)
Medical screening examination/treatment/procedure(s) were conducted as a shared visit with non-physician practitioner(s) and myself.  I personally evaluated the patient during the encounter  NERVE BLOCK Performed by: Lyanne Co Consent: Verbal consent obtained. Required items: required blood products, implants, devices, and special equipment available Time out: Immediately prior to procedure a "time out" was called to verify the correct patient, procedure, equipment, support staff and site/side marked as required. Indication: laceration repair, finger exploration Nerve block body site: Digital nerves of left little finger  Preparation: Patient was prepped and draped in the usual sterile fashion. Needle gauge: 24 G Location technique: anatomical landmarks Local anesthetic: Lidocaine 1%  Anesthetic total: 4 ml Outcome: pain improved Patient tolerance: Patient tolerated the procedure well with no immediate complications.  Patient with evidence of extensor tendon laceration.  Patient will followup with hand surgery.  Wounds closed.  Home with antibiotics.  It does not have appear to involve the PIP joint    Lyanne Co, MD 07/01/12 [phone]

## 2012-07-01 NOTE — ED Notes (Signed)
PA at bedside 1600-1700 for evaluation, suturing, reassessment by EDP and PA

## 2012-07-02 ENCOUNTER — Encounter (HOSPITAL_BASED_OUTPATIENT_CLINIC_OR_DEPARTMENT_OTHER): Payer: Self-pay | Admitting: *Deleted

## 2012-07-02 ENCOUNTER — Other Ambulatory Visit: Payer: Self-pay | Admitting: Orthopedic Surgery

## 2012-07-02 NOTE — Progress Notes (Signed)
Pt to come in for [id number]-[id number]-sees dr harwani-called for notes-manages her htn

## 2012-07-02 NOTE — Progress Notes (Signed)
07/02/12 1415  OBSTRUCTIVE SLEEP APNEA  Have you ever been diagnosed with sleep apnea through a sleep study? No  Do you snore loudly (loud enough to be heard through closed doors)?  1  Do you often feel tired, fatigued, or sleepy during the daytime? 0  Has anyone observed you stop breathing during your sleep? 0  Do you have, or are you being treated for high blood pressure? 1  BMI more than 35 kg/m2? 1  Age over 54 years old? 1  Gender: 0  Obstructive Sleep Apnea Score 4

## 2012-07-05 ENCOUNTER — Encounter (HOSPITAL_BASED_OUTPATIENT_CLINIC_OR_DEPARTMENT_OTHER)
Admission: RE | Admit: 2012-07-05 | Discharge: 2012-07-05 | Disposition: A | Discharge status: Home / Self Care | Payer: Medicaid Other | Source: Ambulatory Visit | Attending: Orthopedic Surgery | Admitting: Orthopedic Surgery

## 2012-07-05 LAB — BASIC METABOLIC PANEL
CO2: 28 mEq/L (ref 19–32)
Calcium: 9.5 mg/dL (ref 8.4–10.5)
Chloride: 101 mEq/L (ref 96–112)
Creatinine, Ser: 0.71 mg/dL (ref 0.50–1.10)
Glucose, Bld: 64 mg/dL — ABNORMAL LOW (ref 70–99)
Sodium: 137 mEq/L (ref 135–145)

## 2012-07-06 ENCOUNTER — Encounter (HOSPITAL_BASED_OUTPATIENT_CLINIC_OR_DEPARTMENT_OTHER)
Admission: RE | Disposition: A | Discharge status: Home / Self Care | Payer: Self-pay | Source: Ambulatory Visit | Attending: Orthopedic Surgery

## 2012-07-06 ENCOUNTER — Encounter (HOSPITAL_BASED_OUTPATIENT_CLINIC_OR_DEPARTMENT_OTHER): Payer: Self-pay | Admitting: Orthopedic Surgery

## 2012-07-06 ENCOUNTER — Ambulatory Visit (HOSPITAL_BASED_OUTPATIENT_CLINIC_OR_DEPARTMENT_OTHER)
Admission: RE | Admit: 2012-07-06 | Discharge: 2012-07-06 | Disposition: A | Discharge status: Home / Self Care | Payer: Medicaid Other | Source: Ambulatory Visit | Attending: Orthopedic Surgery | Admitting: Orthopedic Surgery

## 2012-07-06 ENCOUNTER — Encounter (HOSPITAL_BASED_OUTPATIENT_CLINIC_OR_DEPARTMENT_OTHER): Payer: Self-pay | Admitting: Certified Registered Nurse Anesthetist

## 2012-07-06 ENCOUNTER — Ambulatory Visit (HOSPITAL_BASED_OUTPATIENT_CLINIC_OR_DEPARTMENT_OTHER): Payer: Medicaid Other | Admitting: Certified Registered Nurse Anesthetist

## 2012-07-06 DIAGNOSIS — W208XXA Other cause of strike by thrown, projected or falling object, initial encounter: Secondary | ICD-10-CM | POA: Insufficient documentation

## 2012-07-06 DIAGNOSIS — Z885 Allergy status to narcotic agent status: Secondary | ICD-10-CM | POA: Insufficient documentation

## 2012-07-06 DIAGNOSIS — I10 Essential (primary) hypertension: Secondary | ICD-10-CM | POA: Insufficient documentation

## 2012-07-06 DIAGNOSIS — IMO0001 Reserved for inherently not codable concepts without codable children: Secondary | ICD-10-CM | POA: Insufficient documentation

## 2012-07-06 DIAGNOSIS — S62339A Displaced fracture of neck of unspecified metacarpal bone, initial encounter for closed fracture: Secondary | ICD-10-CM | POA: Insufficient documentation

## 2012-07-06 DIAGNOSIS — M129 Arthropathy, unspecified: Secondary | ICD-10-CM | POA: Insufficient documentation

## 2012-07-06 DIAGNOSIS — S61209A Unspecified open wound of unspecified finger without damage to nail, initial encounter: Secondary | ICD-10-CM | POA: Insufficient documentation

## 2012-07-06 DIAGNOSIS — Z91013 Allergy to seafood: Secondary | ICD-10-CM | POA: Insufficient documentation

## 2012-07-06 HISTORY — PX: OPEN REDUCTION INTERNAL FIXATION (ORIF) METACARPAL: SHX6234

## 2012-07-06 HISTORY — PX: REPAIR EXTENSOR TENDON: SHX5382

## 2012-07-06 HISTORY — DX: Presence of spectacles and contact lenses: Z97.3

## 2012-07-06 LAB — POCT HEMOGLOBIN-HEMACUE: Hemoglobin: 13.4 g/dL (ref 12.0–15.0)

## 2012-07-06 SURGERY — OPEN REDUCTION INTERNAL FIXATION (ORIF) METACARPAL
Anesthesia: Regional | Site: Hand | Laterality: Left | Wound class: Contaminated

## 2012-07-06 MED ORDER — CHLORHEXIDINE GLUCONATE 4 % EX LIQD: 60.0000 mL | Freq: Once | CUTANEOUS | Status: DC

## 2012-07-06 MED ORDER — FENTANYL CITRATE 0.05 MG/ML IJ SOLN
50.0000 ug | INTRAMUSCULAR | Status: DC | PRN
  Administered 2012-07-06: 50 ug via INTRAVENOUS

## 2012-07-06 MED ORDER — MIDAZOLAM HCL 2 MG/2ML IJ SOLN
1.0000 mg | INTRAMUSCULAR | Status: DC | PRN
  Administered 2012-07-06: 1 mg via INTRAVENOUS

## 2012-07-06 MED ORDER — ONDANSETRON HCL 4 MG/2ML IJ SOLN
INTRAMUSCULAR | Status: DC | PRN
  Administered 2012-07-06: 4 mg via INTRAVENOUS

## 2012-07-06 MED ORDER — ACETAMINOPHEN 10 MG/ML IV SOLN: 1000.0000 mg | Freq: Once | INTRAVENOUS | Status: DC | PRN

## 2012-07-06 MED ORDER — CEFAZOLIN SODIUM-DEXTROSE 2-3 GM-% IV SOLR
2.0000 g | INTRAVENOUS | Status: AC
  Administered 2012-07-06: 2 g via INTRAVENOUS

## 2012-07-06 MED ORDER — BUPIVACAINE HCL (PF) 0.25 % IJ SOLN
INTRAMUSCULAR | Status: DC | PRN
  Administered 2012-07-06: 10 mL

## 2012-07-06 MED ORDER — ONDANSETRON HCL 4 MG/2ML IJ SOLN: 4.0000 mg | Freq: Once | INTRAMUSCULAR | Status: DC | PRN

## 2012-07-06 MED ORDER — FENTANYL CITRATE 0.05 MG/ML IJ SOLN
INTRAMUSCULAR | Status: DC | PRN
  Administered 2012-07-06 (×3): 25 ug via INTRAVENOUS

## 2012-07-06 MED ORDER — OXYCODONE-ACETAMINOPHEN 5-325 MG PO TABS: ORAL_TABLET | ORAL | Status: DC

## 2012-07-06 MED ORDER — 0.9 % SODIUM CHLORIDE (POUR BTL) OPTIME
TOPICAL | Status: DC | PRN
  Administered 2012-07-06: 1000 mL

## 2012-07-06 MED ORDER — LIDOCAINE HCL (CARDIAC) 20 MG/ML IV SOLN
INTRAVENOUS | Status: DC | PRN
  Administered 2012-07-06: 30 mg via INTRAVENOUS

## 2012-07-06 MED ORDER — MIDAZOLAM HCL 5 MG/5ML IJ SOLN
INTRAMUSCULAR | Status: DC | PRN
  Administered 2012-07-06: 1 mg via INTRAVENOUS

## 2012-07-06 MED ORDER — DEXAMETHASONE SODIUM PHOSPHATE 10 MG/ML IJ SOLN
INTRAMUSCULAR | Status: DC | PRN
  Administered 2012-07-06: 10 mg via INTRAVENOUS

## 2012-07-06 MED ORDER — HYDROMORPHONE HCL PF 1 MG/ML IJ SOLN: 0.2500 mg | INTRAMUSCULAR | Status: DC | PRN

## 2012-07-06 MED ORDER — PROPOFOL 10 MG/ML IV BOLUS
INTRAVENOUS | Status: DC | PRN
  Administered 2012-07-06: 200 mg via INTRAVENOUS

## 2012-07-06 MED ORDER — LACTATED RINGERS IV SOLN
INTRAVENOUS | Status: DC
  Administered 2012-07-06 (×2): via INTRAVENOUS

## 2012-07-06 SURGICAL SUPPLY — 98 items
BAG DECANTER FOR FLEXI CONT (MISCELLANEOUS) IMPLANT
BALL CTTN LRG ABS STRL LF (GAUZE/BANDAGES/DRESSINGS)
BANDAGE CONFORM 2  STR LF (GAUZE/BANDAGES/DRESSINGS) IMPLANT
BANDAGE ELASTIC 3 VELCRO ST LF (GAUZE/BANDAGES/DRESSINGS) ×2 IMPLANT
BANDAGE GAUZE ELAST BULKY 4 IN (GAUZE/BANDAGES/DRESSINGS) ×2 IMPLANT
BIT DRILL 1.1 MINI QC NONSTRL (BIT) ×2 IMPLANT
BLADE MINI RND TIP GREEN BEAV (BLADE) IMPLANT
BLADE SURG 15 STRL LF DISP TIS (BLADE) ×2 IMPLANT
BLADE SURG 15 STRL SS (BLADE) ×4
BNDG CMPR 9X4 STRL LF SNTH (GAUZE/BANDAGES/DRESSINGS) ×1
BNDG CMPR MD 5X2 ELC HKLP STRL (GAUZE/BANDAGES/DRESSINGS)
BNDG ELASTIC 2 VLCR STRL LF (GAUZE/BANDAGES/DRESSINGS) IMPLANT
BNDG ESMARK 4X9 LF (GAUZE/BANDAGES/DRESSINGS) ×2 IMPLANT
CHLORAPREP W/TINT 26ML (MISCELLANEOUS) ×4 IMPLANT
CLOTH BEACON ORANGE TIMEOUT ST (SAFETY) ×2 IMPLANT
CORDS BIPOLAR (ELECTRODE) ×2 IMPLANT
COTTONBALL LRG STERILE PKG (GAUZE/BANDAGES/DRESSINGS) IMPLANT
COVER MAYO STAND STRL (DRAPES) ×2 IMPLANT
COVER TABLE BACK 60X90 (DRAPES) ×2 IMPLANT
CUFF TOURNIQUET SINGLE 18IN (TOURNIQUET CUFF) ×2 IMPLANT
DECANTER SPIKE VIAL GLASS SM (MISCELLANEOUS) IMPLANT
DRAIN TLS ROUND 10FR (DRAIN) IMPLANT
DRAPE EXTREMITY T 121X128X90 (DRAPE) ×2 IMPLANT
DRAPE OEC MINIVIEW 54X84 (DRAPES) ×4 IMPLANT
DRAPE SURG 17X23 STRL (DRAPES) ×2 IMPLANT
DRSG PAD ABDOMINAL 8X10 ST (GAUZE/BANDAGES/DRESSINGS) IMPLANT
GAUZE SPONGE 4X4 16PLY XRAY LF (GAUZE/BANDAGES/DRESSINGS) IMPLANT
GAUZE XEROFORM 1X8 LF (GAUZE/BANDAGES/DRESSINGS) ×2 IMPLANT
GLOVE BIO SURGEON STRL SZ7.5 (GLOVE) ×2 IMPLANT
GLOVE BIOGEL PI IND STRL 7.0 (GLOVE) ×3 IMPLANT
GLOVE BIOGEL PI IND STRL 8 (GLOVE) ×1 IMPLANT
GLOVE BIOGEL PI IND STRL 8.5 (GLOVE) ×1 IMPLANT
GLOVE BIOGEL PI INDICATOR 7.0 (GLOVE) ×3
GLOVE BIOGEL PI INDICATOR 8 (GLOVE) ×1
GLOVE BIOGEL PI INDICATOR 8.5 (GLOVE) ×1
GLOVE ECLIPSE 6.5 STRL STRAW (GLOVE) ×2 IMPLANT
GLOVE SURG ORTHO 8.0 STRL STRW (GLOVE) ×4 IMPLANT
GOWN BRE IMP PREV XXLGXLNG (GOWN DISPOSABLE) ×4 IMPLANT
GOWN PREVENTION PLUS XLARGE (GOWN DISPOSABLE) ×2 IMPLANT
GOWN PREVENTION PLUS XXLARGE (GOWN DISPOSABLE) ×4 IMPLANT
K-WIRE DBL TRONS .035X6 (WIRE) ×4
KWIRE 4.0 X .035IN (WIRE) IMPLANT
KWIRE DBL TRONS .035X6 (WIRE) ×2 IMPLANT
LOOP VESSEL MAXI BLUE (MISCELLANEOUS) IMPLANT
NEEDLE HYPO 22GX1.5 SAFETY (NEEDLE) IMPLANT
NEEDLE HYPO 25X1 1.5 SAFETY (NEEDLE) ×2 IMPLANT
NEEDLE KEITH (NEEDLE) IMPLANT
NS IRRIG 1000ML POUR BTL (IV SOLUTION) ×2 IMPLANT
PACK BASIN DAY SURGERY FS (CUSTOM PROCEDURE TRAY) ×2 IMPLANT
PAD CAST 3X4 CTTN HI CHSV (CAST SUPPLIES) ×1 IMPLANT
PAD CAST 4YDX4 CTTN HI CHSV (CAST SUPPLIES) ×1 IMPLANT
PADDING CAST ABS 3INX4YD NS (CAST SUPPLIES)
PADDING CAST ABS 4INX4YD NS (CAST SUPPLIES) ×1
PADDING CAST ABS COTTON 3X4 (CAST SUPPLIES) IMPLANT
PADDING CAST ABS COTTON 4X4 ST (CAST SUPPLIES) ×1 IMPLANT
PADDING CAST COTTON 3X4 STRL (CAST SUPPLIES) ×2
PADDING CAST COTTON 4X4 STRL (CAST SUPPLIES) ×2
PLATE LOCKING 1.5 Y SHAPE (Plate) ×2 IMPLANT
SCREW LOCKING 1.5X10 (Screw) ×2 IMPLANT
SCREW LOCKING 1.5X13MM (Screw) ×2 IMPLANT
SCREW NL 1.5X11 WRIST (Screw) ×2 IMPLANT
SCREW NONIOC 1.5 10M (Screw) ×6 IMPLANT
SCREW NONIOC 1.5 14M (Screw) ×2 IMPLANT
SLEEVE SCD COMPRESS KNEE MED (MISCELLANEOUS) ×2 IMPLANT
SPLINT PLASTER CAST XFAST 3X15 (CAST SUPPLIES) IMPLANT
SPLINT PLASTER CAST XFAST 4X15 (CAST SUPPLIES) IMPLANT
SPLINT PLASTER XTRA FAST SET 4 (CAST SUPPLIES)
SPLINT PLASTER XTRA FASTSET 3X (CAST SUPPLIES)
SPONGE GAUZE 4X4 12PLY (GAUZE/BANDAGES/DRESSINGS) ×2 IMPLANT
STOCKINETTE 4X48 STRL (DRAPES) ×2 IMPLANT
SUT CHROMIC 5 0 P 3 (SUTURE) IMPLANT
SUT ETHIBOND 3-0 V-5 (SUTURE) IMPLANT
SUT ETHILON 3 0 PS 1 (SUTURE) IMPLANT
SUT ETHILON 4 0 PS 2 18 (SUTURE) ×2 IMPLANT
SUT FIBERWIRE 3-0 18 TAPR NDL (SUTURE)
SUT FIBERWIRE 4-0 18 DIAM BLUE (SUTURE)
SUT MERSILENE 2.0 SH NDLE (SUTURE) IMPLANT
SUT MERSILENE 3 0 FS 1 (SUTURE) IMPLANT
SUT MERSILENE 4 0 P 3 (SUTURE) ×4 IMPLANT
SUT MON AB 5-0 P3 18 (SUTURE) ×4 IMPLANT
SUT POLY BUTTON 15MM (SUTURE) IMPLANT
SUT PROLENE 2 0 SH DA (SUTURE) IMPLANT
SUT PROLENE 6 0 P 1 18 (SUTURE) IMPLANT
SUT SILK 2 0 FS (SUTURE) IMPLANT
SUT SILK 4 0 PS 2 (SUTURE) IMPLANT
SUT STEEL 4 0 V 26 (SUTURE) IMPLANT
SUT VIC AB 3-0 PS1 18 (SUTURE)
SUT VIC AB 3-0 PS1 18XBRD (SUTURE) IMPLANT
SUT VIC AB 4-0 P-3 18XBRD (SUTURE) ×1 IMPLANT
SUT VIC AB 4-0 P3 18 (SUTURE) ×2
SUT VICRYL 4-0 PS2 18IN ABS (SUTURE) IMPLANT
SUTURE FIBERWR 3-0 18 TAPR NDL (SUTURE) IMPLANT
SUTURE FIBERWR 4-0 18 DIA BLUE (SUTURE) IMPLANT
SYR BULB 3OZ (MISCELLANEOUS) ×2 IMPLANT
SYR CONTROL 10ML LL (SYRINGE) ×2 IMPLANT
TOWEL OR 17X24 6PK STRL BLUE (TOWEL DISPOSABLE) ×4 IMPLANT
TUBE FEEDING 5FR 15 INCH (TUBING) IMPLANT
UNDERPAD 30X30 INCONTINENT (UNDERPADS AND DIAPERS) ×2 IMPLANT

## 2012-07-06 NOTE — Op Note (Signed)
[id number] 

## 2012-07-06 NOTE — Brief Op Note (Signed)
07/06/2012  3:33 PM  PATIENT:  Sandra Church  52 y.o. female  PRE-OPERATIVE DIAGNOSIS:  LEFT SMALL METACARPAL FRACTURE AND EXTENSOR TENDON LACERATION  POST-OPERATIVE DIAGNOSIS:  LEFT SMALL METACARPAL FRACTURE AND EXTENSOR TENDON LACERATION  PROCEDURE:  Procedure(s): LEFT HAND EXPLORATION WOUNDS, OPEN REDUCTION INTERNAL FIXATION SMALL METACARPAL FRACTURE REPAIR EXTENSOR TENDON  SURGEON:  Surgeon(s): Tami Ribas, MD Nicki Reaper, MD  PHYSICIAN ASSISTANT:   ASSISTANTS: Cindee Salt, MD   ANESTHESIA:   regional and general  EBL:  Total I/O In: 1200 [I.V.:1200] Out: -   DRAINS: none   LOCAL MEDICATIONS USED:  MARCAINE     SPECIMEN:  No Specimen  DISPOSITION OF SPECIMEN:  N/A  COUNTS:  YES  TOURNIQUET:   Total Tourniquet Time Documented: Upper Arm (Left) - 93 minutes Total: Upper Arm (Left) - 93 minutes   DICTATION: .Other Dictation: Dictation Number [id number]  PLAN OF CARE: Discharge to home after PACU

## 2012-07-06 NOTE — Progress Notes (Signed)
Assisted Dr. Joslin with left, ultrasound guided, supraclavicular block. Side rails up, monitors on throughout procedure. See vital signs in flow sheet. Tolerated Procedure well. 

## 2012-07-06 NOTE — Anesthesia Postprocedure Evaluation (Signed)
  Anesthesia Post-op Note  Patient: Sandra Church  Procedure(s) Performed: Procedure(s) with comments: LEFT HAND EXPLORATION WOUNDS, OPEN REDUCTION INTERNAL FIXATION SMALL METACARPAL FRACTURE (Left) REPAIR EXTENSOR TENDON (Left) - Left   Patient Location: PACU  Anesthesia Type:General and GA combined with regional for post-op pain  Level of Consciousness: awake, alert  and oriented  Airway and Oxygen Therapy: Patient Spontanous Breathing and Patient connected to nasal cannula oxygen  Post-op Pain: none  Post-op Assessment: Post-op Vital signs reviewed, Patient's Cardiovascular Status Stable, Respiratory Function Stable, Patent Airway and Pain level controlled  Post-op Vital Signs: stable  Complications: No apparent anesthesia complications

## 2012-07-06 NOTE — Op Note (Signed)
Sandra, Church NO.:  [id number]  MEDICAL RECORD NO.:  [id number]  LOCATION:  WTR5                         FACILITY:  [facility]  PHYSICIAN:  Betha Loa, MD        DATE OF BIRTH:  November 10, 1960  DATE OF PROCEDURE:  07/06/2012 DATE OF DISCHARGE:  07/06/2012                              OPERATIVE REPORT   PREOPERATIVE DIAGNOSES:  Left small finger, zone 3 extensor tendon laceration and small finger metacarpal neck fracture.  POSTOPERATIVE DIAGNOSES:  Left small finger, zone 3 extensor tendon laceration and small finger metacarpal neck fracture.  PROCEDURE:   1. Left small finger repair of zone 3 extensor tendon laceration 2. Exploration of palm wound  3. Open reduction internal fixation of small metacarpal neck fracture  SURGEON:  Betha Loa, MD  ASSISTANT:  Cindee Salt, MD  ANESTHESIA:  General with regional.  IV FLUIDS:  Per anesthesia flow sheet.  ESTIMATED BLOOD LOSS:  Minimal.  COMPLICATIONS:  None.  SPECIMENS:  None.  TOURNIQUET TIME:  1 hour 32 minutes.  DISPOSITION:  Stable to PACU.  INDICATIONS:  Sandra Church is a 52 year old female who last week was In her RV when a TV fell landing on her left hand.  She has been seen at [facility] Emergency Department where radiographs revealed a small finger metacarpal neck fracture.  She had lacerations on her hand, which were irrigated and debrided and sutured closed by the emergency department.  She was referred to me.  On examination, she had intact sensation and capillary refill in the fingertips.  She had weak extension of the small finger.  Radiographs showed a small finger metacarpal neck fracture.  I discussed with Sandra Church, the nature of the injuries.  Recommend exploration of the wounds with repair of extensor tendon laceration and closed reduction percutaneous pinning versus open reduction and internal fixation of the metacarpal fracture. Risks, benefits, and alternatives of the surgery  were discussed including risk of blood loss, infection, damage to nerves, vessels, tendons, ligaments, bone; failure of surgery; need for additional surgery, complications with wound healing, continued pain, nonunion, malunion, stiffness.  She voiced understanding of these risks and elected to proceed.  OPERATIVE COURSE:  After being identified preoperatively by myself, the patient and I agreed upon the procedure and site of the procedure. Surgical site was marked.  The risks, benefits, and alternatives of surgery were reviewed and she wished to proceed.  Surgical consent had been signed.  She was given IV Ancef as preoperative antibiotic prophylaxis.  Regional block was performed by Anesthesia in preoperative holding.  She was transported to the operating room and placed in the operating table in supine position in the left upper extremity arm board.  General anesthesia was induced by anesthesiologist.  The left upper extremity was prepped and draped in normal sterile orthopedic fashion.  Surgical pause was performed between surgeons, anesthesia, operating staff, and all were in agreement as to the patient, procedure, and site of procedure.  Tourniquet at the proximal aspect of the extremity was inflated to 250 mmHg after exsanguination of the limb with an Esmarch bandage.  The wound on the dorsum of the small finger was explored.  There was laceration  of the central slip of the tendon just proximal to the PIP joint.  There was a stump of the central slip attached to the middle phalanx remaining.  The wound was extended both proximally and distally to gain better visualization.  The wound was copiously irrigated.  The wound on the palmar aspect in the ring and small finger webspace was explored.  The radial digital nerve and vessel to the small finger were visualized and were intact.  The wound was copiously irrigated with sterile saline.  Attention was turned to the small finger  metacarpal fracture.  Incision was made on the dorsal ulnar side of the hand carried into subcutaneous tissues by spreading technique.  Bipolar electrocautery was used to obtain hemostasis.  The periosteum was incised.  The portion of the sagittal band had to be released for visualization.  The fracture site was identified.  It was impacted.  It was freed up and reduced.  It was held provisionally with a K-wire.  C-arm was used in AP, lateral, and oblique projections to ensure appropriate reduction which was the case.  A wide plate from the ALPS set was selected.  It was broken to provide appropriate fit.  It was provisionally stabilized with a K-wire.  Standard AO drilling and measuring technique was used.  Nonlocking screws were placed in the shaft of the plate and 2 locking screws were placed in the distal portions of the Y of the plate into the metacarpal head.  This provided good stabilization.  Good purchase was obtained overall, but 1 screw in which only adequate purchase was obtained.  The proximal screw hole had broken through when the plate was shortened.  The screw hole still filled as it provided adequate stabilization.  The C-arm was used in AP, lateral, and oblique projections to ensure appropriate reduction and position of the hardware, which was the case.  The wound was copiously irrigated with sterile saline.  The periosteum was repaired back over top of the plate using 4-0 Vicryl suture.  The sagittal band release was repaired with 4-0 Mersilene suture.  The 4-0 Vicryl was used in an interrupted fashion in the subcutaneous tissues of the skin and the skin was closed with a 5-0 Monocryl in a horizontal mattress fashion. Attention was turned to the extensor tendon laceration.  This was repaired using the 4-0 Mersilene suture.  Figure-of-eight technique was used.  The laceration was C-shaped.  Good apposition of the tendon ends was obtained.  The skin laceration on the  dorsum of the finger and in the palm was then repaired with the 5-0 Mersilene in a horizontal mattress fashion.  The wounds were injected with 10 mL of 0.25% plain Marcaine to aid in postoperative analgesia.  They were then dressed with sterile Xeroform, 4 x 4s, and wrapped with a Kerlix bandage.  A volar and dorsal slab splint including the long, ring, and small fingers was placed with the MPs flexed and the IPs extended.  This was wrapped with Kerlix and Ace bandage.  Tourniquet was deflated at 1 hour 32 minutes. Fingertips were pink with brisk capillary refill after deflation of the tourniquet.  Operative drapes were broken down.  The patient was awoken from anesthesia safely.  She was transferred back to stretcher and taken to PACU in stable condition.  I will see her back in the office in 1 week for postoperative followup.  I will give her Percocet 5/325, 1-2 p.o. q.6 h. p.r.n. pain, dispensed.    Caryn Bee  Merlyn Lot, MD    KK/MEDQ  D:  07/06/2012  T:  07/06/2012  Job:  [id number]

## 2012-07-06 NOTE — H&P (Signed)
Sandra Church is an 52 y.o. female.   Chief Complaint: left metacarpal fracture and extensor tendon laceration HPI: 52 yo rhd female states a tv fell onto left hand 07/01/12.  Seen at [facility] where XR revealed small finger metacarpal fracture.  Lacerations I&D'd by ER and closed.  Reported tendon laceration to small finger.  Reports no previous injury to the left hand and no other injury at this time.  Past Medical History  Diagnosis Date  . Fibromyalgia   . Arthritis   . Hypertension   . Mitral valve problem   . Wears glasses     Past Surgical History  Procedure Laterality Date  . Foot surgery      plantar fascitis  . Shoulder surgery  2010    rt  . Joint replacement  2011    rt total  . Closed manipulation knee with steriod injection  2011    rt  . Knee arthroplasty  2012    rt  . Foot bone excision      both feet-spurs  . Diagnostic laparoscopy      Family History  Problem Relation Age of Onset  . Diabetes Mother   . Stroke Mother   . Cancer Father   . Hypertension Father    Social History:  reports that she has never smoked. She does not have any smokeless tobacco history on file. She reports that she does not drink alcohol or use illicit drugs.  Allergies:  Allergies  Allergen Reactions  . Shrimp (Shellfish Allergy) Anaphylaxis, Shortness Of Breath and Swelling  . Codeine Itching    Only reacts at high doses    No prescriptions prior to admission    Results for orders placed during the hospital encounter of 07/06/12 (from the past 48 hour(s))  BASIC METABOLIC PANEL     Status: Abnormal   Collection Time    07/05/12  1:00 PM      Result Value Range   Sodium 137  135 - 145 mEq/L   Potassium 3.8  3.5 - 5.1 mEq/L   Chloride 101  96 - 112 mEq/L   CO2 28  19 - 32 mEq/L   Glucose, Bld 64 (*) 70 - 99 mg/dL   BUN 13  6 - 23 mg/dL   Creatinine, Ser 1.61  0.50 - 1.10 mg/dL   Calcium 9.5  8.4 - 09.6 mg/dL   GFR calc non Af Amer >90  >90 mL/min   GFR calc  Af Amer >90  >90 mL/min   Comment:            The eGFR has been calculated     using the CKD EPI equation.     This calculation has not been     validated in all clinical     situations.     eGFR's persistently     <90 mL/min signify     possible Chronic Kidney Disease.    No results found.   A comprehensive review of systems was negative except for: Eyes: positive for contacts/glasses  Height 5\' 7"  (1.702 m), weight 111.131 kg (245 lb).  General appearance: alert, cooperative and appears stated age Head: Normocephalic, without obvious abnormality, atraumatic Neck: supple, symmetrical, trachea midline Resp: clear to auscultation bilaterally Cardio: regular rate and rhythm GI: non tender Extremities: intact sensation and capillary refill all digits.  +epl/fpl/weak io.  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  lacerations to dorsum small finger at pip and at ring/small webspace.  no  erythema. Pulses: 2+ and symmetric Skin: as above Neurologic: Grossly normal Incision/Wound: As above  Assessment/Plan Left hand small finger metacarpal fracture and small finger extensor tendon laceration.  Non operative and operative treatment options were discussed with the patient and patient wishes to proceed with operative treatment. Risks, benefits, and alternatives of surgery were discussed and the patient agrees with the plan of care.   Boniface Goffe R 07/06/2012, 9:12 AM

## 2012-07-06 NOTE — Transfer of Care (Signed)
Immediate Anesthesia Transfer of Care Note  Patient: Sandra Church  Procedure(s) Performed: Procedure(s) with comments: LEFT HAND EXPLORATION WOUNDS, OPEN REDUCTION INTERNAL FIXATION SMALL METACARPAL FRACTURE (Left) REPAIR EXTENSOR TENDON (Left) - Left   Patient Location: PACU  Anesthesia Type:GA combined with regional for post-op pain  Level of Consciousness: awake, alert , oriented and patient cooperative  Airway & Oxygen Therapy: Patient Spontanous Breathing and Patient connected to face mask oxygen  Post-op Assessment: Report given to PACU RN and Post -op Vital signs reviewed and stable  Post vital signs: Reviewed and stable  Complications: No apparent anesthesia complications

## 2012-07-06 NOTE — Anesthesia Preprocedure Evaluation (Signed)
Anesthesia Evaluation Anesthesia Physical Anesthesia Plan  ASA: II  Anesthesia Plan:    Post-op Pain Management:    Induction:   Airway Management Planned:   Additional Equipment:   Intra-op Plan:   Post-operative Plan:   Informed Consent:   Plan Discussed with:   Anesthesia Plan Comments:         Anesthesia Quick Evaluation  

## 2012-07-06 NOTE — Anesthesia Procedure Notes (Addendum)
Anesthesia Regional Block:  Supraclavicular block  Pre-Anesthetic Checklist: ,, timeout performed, Correct Patient, Correct Site, Correct Laterality, Correct Procedure, Correct Position, site marked, Risks and benefits discussed,  Surgical consent,  Pre-op evaluation,  At surgeon's request and post-op pain management  Laterality: Left  Prep: chloraprep       Needles:  Injection technique: Single-shot  Needle Type: Echogenic Stimulator Needle      Needle Gauge: 22 and 22 G    Additional Needles:  Procedures: ultrasound guided (picture in chart) Supraclavicular block Narrative:  Start time: 07/06/2012 12:50 PM End time: 07/06/2012 1:00 PM Injection made incrementally with aspirations every 5 mL.  Performed by: Personally   Additional Notes: 20 cc 0.5% marcaine 1:200 Epi injected without difficulty  Kipp Brood, MD  Supraclavicular block Procedure Name: LMA Insertion Date/Time: 07/06/2012 1:38 PM Performed by: Kule Gascoigne D Pre-anesthesia Checklist: Patient identified, Emergency Drugs available, Suction available and Patient being monitored Patient Re-evaluated:Patient Re-evaluated prior to inductionOxygen Delivery Method: Circle System Utilized Preoxygenation: Pre-oxygenation with 100% oxygen Intubation Type: IV induction Ventilation: Mask ventilation without difficulty LMA: LMA inserted LMA Size: 4.0 Number of attempts: 1 Airway Equipment and Method: bite block Placement Confirmation: positive ETCO2 Tube secured with: Tape Dental Injury: Teeth and Oropharynx as per pre-operative assessment

## 2012-07-06 NOTE — Op Note (Deleted)
Sandra Church, GARMAN NO.:  [id number]  MEDICAL RECORD NO.:  [id number]  LOCATION:  WTR5                         FACILITY:  [facility]  PHYSICIAN:  Betha Loa, MD        DATE OF BIRTH:  [birth year]  DATE OF PROCEDURE:  07/06/2012 DATE OF DISCHARGE:  07/06/2012                              OPERATIVE REPORT   PREOPERATIVE DIAGNOSES:  Left small finger, zone 3 extensor tendon laceration and small finger metacarpal neck fracture.  POSTOPERATIVE DIAGNOSES:  Left small finger, zone 3 extensor tendon laceration and small finger metacarpal neck fracture.  PROCEDURE:  Left small finger repair of zone 3 extensor tendon laceration, exploration of the wound and palm and open reduction internal fixation of metacarpal neck fracture.  SURGEON:  Betha Loa, MD  ASSISTANT:  Cindee Salt, MD  ANESTHESIA:  General with regional.  IV FLUIDS:  Per anesthesia flow sheet.  ESTIMATED BLOOD LOSS:  Minimal.  COMPLICATIONS:  None.  SPECIMENS:  None.  TOURNIQUET TIME:  1 hour 32 minutes.  DISPOSITION:  Stable to PACU.  INDICATIONS:  Ms. Sandra Church is a 52 year old female who last week was __________ when the TV fell landing on her left hand.  She has been seen at [facility] Emergency Department where radiographs revealed a small finger metacarpal neck fracture.  She had lacerations on her hand, which were irrigated and debrided and sutured closed by the emergency department.  She was referred to me.  On examination, she had intact sensation and capillary refill in the fingertips.  She had weak extension of the small finger.  Radiographs showed a small finger metacarpal neck fracture.  I discussed with Sandra Church, the nature of the injuries.  Recommend exploration of the wounds with repair of extensor tendon laceration and closed reduction percutaneous pinning versus open reduction and internal fixation of the metacarpal fracture. Risks, benefits, and alternatives of the surgery  were discussed including risk of blood loss, infection, damage to nerves, vessels, tendons, ligaments, bone; failure of surgery; need for additional surgery, complications with wound healing, continued pain, nonunion, malunion, stiffness.  She voiced understanding of these risks and elected to proceed.  OPERATIVE COURSE:  After being identified preoperatively by myself, the patient and I agreed upon the procedure and site of the procedure. Surgical site was marked.  The risks, benefits, and alternatives of surgery were reviewed and she wished to proceed.  Surgical consent had been signed.  She was given IV Ancef as preoperative antibiotic prophylaxis.  Regional block was performed by Anesthesia in preoperative holding.  She was transported to the operating room and placed in the operating table in supine position in the left upper extremity arm board.  General anesthesia was induced by anesthesiologist.  The left upper extremity was prepped and draped in normal sterile orthopedic fashion.  Surgical pause was performed between surgeons, anesthesia, operating staff, and all were in agreement as to the patient, procedure, and site of procedure.  Tourniquet at the proximal aspect of the extremity was inflated to 250 mmHg after exsanguination of the limb with an Esmarch bandage.  The wound on the dorsum of the small finger was explored.  There was laceration of the central slip of  the tendon just proximal to the PIP joint.  There was a stump of the central slip attached to the middle phalanx remaining.  The wound was extended both proximally and distally to gain better visualization.  The wound was copiously irrigated.  The wound on the palmar aspect in the ring and small finger webspace was explored.  The radial digital nerve and vessel to the small finger were visualized and were intact.  The wound was copiously irrigated with sterile saline.  Attention was turned to the small finger  metacarpal fracture.  Incision was made on the dorsal ulnar side of the hand carried into subcutaneous tissues by spreading technique.  Bipolar electrocautery was used to obtain hemostasis.  The periosteum was incised.  The portion of the sagittal band had to be released for visualization.  The fracture site was identified.  It was impacted.  It was freed up and reduced.  It was held provisionally with a K-wire.  C-arm was used in AP, lateral, and oblique projections to ensure appropriate reduction which was the case.  A wide plate from the ALPS set was selected.  It was broken to provide appropriate fit.  It was provisionally stabilized with a K-wire.  Standard AO drilling and measuring technique was used.  Nonlocking screws were placed in the shaft of the plate and 2 locking screws were placed in the distal portions of the Y of the plate into the metacarpal head.  This provided good stabilization.  Good purchase was obtained in all, but 1 screw in which adequate purchase was obtained.  The proximal screw hole had broken through when the plate was shortened.  The screw hole still filled as it provided adequate stabilization.  The C-arm was used in AP, lateral, and oblique projections to ensure appropriate reduction and position of the hardware, which was the case.  The wound was copiously irrigated with sterile saline.  The periosteum was repaired back over top of the plate using 4-0 Vicryl suture.  The sagittal band release was repaired with 4-0 Mersilene suture.  The 4-0 Vicryl was used in an interrupted fashion in the subcutaneous tissues of the skin and the skin was closed with a 5-0 Monocryl in a horizontal mattress fashion. Attention was turned to the extensor tendon laceration.  This was repaired using the 4-0 Mersilene suture.  Figure-of-eight technique was used.  The laceration was C-shaped.  Good apposition of the tendon ends was obtained.  The skin laceration on the dorsum of  the finger and in the palm was then repaired with the 5-0 Mersilene in a horizontal mattress fashion.  The wounds were injected with 10 mL of 0.25% plain Marcaine to aid in postoperative analgesia.  They were then dressed with sterile Xeroform, 4 x 4s, and wrapped with a Kerlix bandage.  A volar and dorsal slab splint including the long, ring, and small fingers was placed with the MPs flexed and the IPs extended.  This was wrapped with Kerlix and Ace bandage.  Tourniquet was deflated at 1 hour 32 minutes. Fingertips were pink with brisk capillary refill after deflation of the tourniquet.  Operative drapes were broken down.  The patient was awoken from anesthesia safely.  She was transferred back to stretcher and taken to PACU in stable condition.  I will see her back in the office in 1 week for postoperative followup.  I will give her Percocet 5/325, 1-2 p.o. q.6 h. p.r.n. pain, dispensed.    Betha Loa, MD  KK/MEDQ  D:  07/06/2012  T:  07/06/2012  Job:  [id number]

## 2012-07-08 ENCOUNTER — Encounter (HOSPITAL_BASED_OUTPATIENT_CLINIC_OR_DEPARTMENT_OTHER): Payer: Self-pay | Admitting: Orthopedic Surgery

## 2012-07-14 ENCOUNTER — Ambulatory Visit: Payer: Medicaid Other | Attending: Orthopedic Surgery | Admitting: Occupational Therapy

## 2012-07-14 DIAGNOSIS — IMO0001 Reserved for inherently not codable concepts without codable children: Secondary | ICD-10-CM | POA: Insufficient documentation

## 2012-07-14 DIAGNOSIS — M2560 Stiffness of unspecified joint, not elsewhere classified: Secondary | ICD-10-CM | POA: Insufficient documentation

## 2012-07-14 DIAGNOSIS — Z4789 Encounter for other orthopedic aftercare: Secondary | ICD-10-CM | POA: Insufficient documentation

## 2012-07-14 DIAGNOSIS — M25549 Pain in joints of unspecified hand: Secondary | ICD-10-CM | POA: Insufficient documentation

## 2012-07-14 DIAGNOSIS — M6281 Muscle weakness (generalized): Secondary | ICD-10-CM | POA: Insufficient documentation

## 2012-08-06 ENCOUNTER — Ambulatory Visit: Payer: Medicaid Other | Admitting: Occupational Therapy

## 2012-08-17 ENCOUNTER — Ambulatory Visit: Payer: Medicaid Other | Attending: Orthopedic Surgery | Admitting: Occupational Therapy

## 2012-08-17 DIAGNOSIS — M6281 Muscle weakness (generalized): Secondary | ICD-10-CM | POA: Insufficient documentation

## 2012-08-17 DIAGNOSIS — IMO0001 Reserved for inherently not codable concepts without codable children: Secondary | ICD-10-CM | POA: Insufficient documentation

## 2012-08-17 DIAGNOSIS — M2560 Stiffness of unspecified joint, not elsewhere classified: Secondary | ICD-10-CM | POA: Insufficient documentation

## 2012-08-17 DIAGNOSIS — Z4789 Encounter for other orthopedic aftercare: Secondary | ICD-10-CM | POA: Insufficient documentation

## 2012-08-17 DIAGNOSIS — M25549 Pain in joints of unspecified hand: Secondary | ICD-10-CM | POA: Insufficient documentation

## 2013-04-22 ENCOUNTER — Other Ambulatory Visit (HOSPITAL_COMMUNITY): Payer: Self-pay | Admitting: Cardiology

## 2013-04-22 DIAGNOSIS — R079 Chest pain, unspecified: Secondary | ICD-10-CM

## 2013-05-04 ENCOUNTER — Encounter (HOSPITAL_COMMUNITY)
Admission: RE | Admit: 2013-05-04 | Discharge: 2013-05-04 | Disposition: A | Discharge status: Home / Self Care | Payer: Medicaid Other | Source: Ambulatory Visit | Attending: Cardiology | Admitting: Cardiology

## 2013-05-04 ENCOUNTER — Other Ambulatory Visit: Payer: Self-pay

## 2013-05-04 DIAGNOSIS — R079 Chest pain, unspecified: Secondary | ICD-10-CM | POA: Insufficient documentation

## 2013-05-04 MED ORDER — TECHNETIUM TC 99M SESTAMIBI GENERIC - CARDIOLITE
30.0000 | Freq: Once | INTRAVENOUS | Status: AC | PRN
  Administered 2013-05-04: 30 via INTRAVENOUS

## 2013-05-04 MED ORDER — REGADENOSON 0.4 MG/5ML IV SOLN
INTRAVENOUS | Status: AC
  Administered 2013-05-04: 0.4 mg via INTRAVENOUS
  Filled 2013-05-04: qty 5

## 2013-05-04 MED ORDER — TECHNETIUM TC 99M SESTAMIBI GENERIC - CARDIOLITE
10.0000 | Freq: Once | INTRAVENOUS | Status: AC | PRN
  Administered 2013-05-04: 10 via INTRAVENOUS

## 2013-05-04 MED ORDER — REGADENOSON 0.4 MG/5ML IV SOLN: 0.4000 mg | Freq: Once | INTRAVENOUS | Status: AC

## 2014-02-01 ENCOUNTER — Ambulatory Visit (INDEPENDENT_AMBULATORY_CARE_PROVIDER_SITE_OTHER): Payer: Medicaid Other | Admitting: Podiatry

## 2014-02-01 ENCOUNTER — Encounter: Payer: Self-pay | Admitting: Podiatry

## 2014-02-01 VITALS — BP 136/94 | HR 75

## 2014-02-01 DIAGNOSIS — M659 Synovitis and tenosynovitis, unspecified: Secondary | ICD-10-CM | POA: Insufficient documentation

## 2014-02-01 DIAGNOSIS — M216X9 Other acquired deformities of unspecified foot: Secondary | ICD-10-CM

## 2014-02-01 DIAGNOSIS — M21969 Unspecified acquired deformity of unspecified lower leg: Secondary | ICD-10-CM

## 2014-02-01 HISTORY — DX: Other acquired deformities of unspecified foot: M21.6X9

## 2014-02-01 HISTORY — DX: Unspecified acquired deformity of unspecified lower leg: M21.969

## 2014-02-01 NOTE — Progress Notes (Signed)
Subjective: 54 year old female presents complaining of pain and swelling in right ankle, pain in foot really bad, difficulty walking x 11 years.  History of injury to back at work 1996.  Had surgery on foot 2 years afterward and had tendon repair near the ankle Right foot bunion surgery done in 2004. For the last 6 years it has been really painful in right medial ankle, heel and arch area.  Now the left foot started to hurt.  Review of Systems - General ROS: negative Ophthalmic ROS: negative ENT ROS: negative Allergy and Immunology ROS: Allergic to Shell fish and shrimp. Endocrine ROS: negative Breast ROS: negative for breast lumps Respiratory ROS: no cough, shortness of breath, or wheezing Cardiovascular ROS: no chest pain or dyspnea on exertion Gastrointestinal ROS: no abdominal pain, change in bowel habits, or black or bloody stools Genito-Urinary ROS: no dysuria, trouble voiding, or hematuria Musculoskeletal ROS: Having pain in finger joints and suffers from Fibromyalgia. Neurological ROS: negative Dermatological ROS: negative.  Objective: Dermatologic: Normal findings. Vascular: All pedal pulses are palpable. Mild enlarged and swollen ankle right.  Neurologic: All epicritic and tactile sensations are grossly intact.  Orthopedic: Tight Achilles tendon right, unable to dorsiflex beyond 90 degree. Post total knee surgery right.  Hypermobile first ray bilateral. STJ hyperpronation right with ankle edema. Short first ray right. Post Bunion surgery right. Pain on medial ankle upon eversion of STJ.  Radiographic examination reveal post surgical first ray right, possible Austin bunionectomy. Decreased medial longitudinal arch bilateral.  Short first ray right. Normal articulation of right ankle joints.  No acute changes noted bilateral.   Assessment: Dysfunctional and Tenosynovitis Posterior tibial tendon right.  Deformed Metatarsal bilateral. STJ hyperpronation right. Ankle  Equinus right.   Plan: Reviewed findings and available treatment options. Patient request surgical option since she suffered too long with the problem. Surgery consent form reviewed for STJ arthroereisis and Cotton osteotomy with bone graft.

## 2014-02-01 NOTE — Patient Instructions (Signed)
Seen for pain in right foot and ankle. Reviewed findings. Surgery consent form reviewed for STJ arthroereisis and Cotton osteotomy right foot.

## 2014-02-23 DIAGNOSIS — M216X9 Other acquired deformities of unspecified foot: Secondary | ICD-10-CM

## 2014-02-23 HISTORY — PX: OTHER SURGICAL HISTORY: SHX169

## 2014-02-28 ENCOUNTER — Encounter: Payer: Medicaid Other | Admitting: Podiatry

## 2014-03-01 ENCOUNTER — Encounter: Payer: Self-pay | Admitting: Podiatry

## 2014-03-01 ENCOUNTER — Ambulatory Visit (INDEPENDENT_AMBULATORY_CARE_PROVIDER_SITE_OTHER): Payer: Medicaid Other | Admitting: Podiatry

## 2014-03-01 VITALS — BP 144/84 | HR 74

## 2014-03-01 DIAGNOSIS — M21961 Unspecified acquired deformity of right lower leg: Secondary | ICD-10-CM

## 2014-03-01 NOTE — Progress Notes (Signed)
6 weeks post op wound following Cotton osteotomy with bone graft right foot.  Minimum pain and doing well walking with walker.  Return in 3 weeks for cast change.

## 2014-03-01 NOTE — Patient Instructions (Signed)
Doing well with cast with minimum pain. Continue with light activity. Return in 3 weeks.

## 2014-03-22 ENCOUNTER — Encounter: Payer: Self-pay | Admitting: Podiatry

## 2014-03-22 ENCOUNTER — Ambulatory Visit (INDEPENDENT_AMBULATORY_CARE_PROVIDER_SITE_OTHER): Payer: Medicaid Other | Admitting: Podiatry

## 2014-03-22 DIAGNOSIS — M21969 Unspecified acquired deformity of unspecified lower leg: Secondary | ICD-10-CM

## 2014-03-22 DIAGNOSIS — M216X1 Other acquired deformities of right foot: Secondary | ICD-10-CM | POA: Diagnosis not present

## 2014-03-22 DIAGNOSIS — M216X9 Other acquired deformities of unspecified foot: Secondary | ICD-10-CM

## 2014-03-22 DIAGNOSIS — M659 Synovitis and tenosynovitis, unspecified: Secondary | ICD-10-CM

## 2014-03-22 NOTE — Progress Notes (Signed)
Status post 4 weeks right foot Cotton osteotomy with bone graft. Patient denies any discomfort. Doing well. Cast removed. Incision site healed well. No edema or erythema noted on the foot.  Post op X-rays taken.  Grafted bone in place. Good correction maintained on the first ray. New fiber glass cast applied to right foot. Return in 3 weeks.

## 2014-03-22 NOTE — Patient Instructions (Signed)
Post op right foot. Doing well. Cast replaced. Limit ambulation. Return in 3 weeks.

## 2014-04-12 ENCOUNTER — Encounter: Payer: Self-pay | Admitting: Podiatry

## 2014-04-12 ENCOUNTER — Ambulatory Visit (INDEPENDENT_AMBULATORY_CARE_PROVIDER_SITE_OTHER): Payer: Medicaid Other | Admitting: Podiatry

## 2014-04-12 DIAGNOSIS — M21961 Unspecified acquired deformity of right lower leg: Secondary | ICD-10-CM | POA: Diagnosis not present

## 2014-04-12 DIAGNOSIS — Z9889 Other specified postprocedural states: Secondary | ICD-10-CM

## 2014-04-12 DIAGNOSIS — M216X1 Other acquired deformities of right foot: Secondary | ICD-10-CM | POA: Diagnosis not present

## 2014-04-12 HISTORY — DX: Other specified postprocedural states: Z98.890

## 2014-04-12 NOTE — Progress Notes (Signed)
7 weeks post op since right foot Cotton osteotomy with bone graft. Doing well with cast. Cast removed. Wound has healed and well coapted. X-ray taken. Good position of grafted bone with good bone to bone contact. No displacement noted. Right lower limb placed in CAM walker. Return in 3 weeks.

## 2014-04-12 NOTE — Patient Instructions (Signed)
7 week post op. Doing well.  Cast removed and placed right foot in CAM walker.  Weight bearing as tolerated.  Return in 3 weeks.

## 2014-05-03 ENCOUNTER — Ambulatory Visit (INDEPENDENT_AMBULATORY_CARE_PROVIDER_SITE_OTHER): Payer: Medicaid Other | Admitting: Podiatry

## 2014-05-03 ENCOUNTER — Encounter: Payer: Self-pay | Admitting: Podiatry

## 2014-05-03 DIAGNOSIS — Z9889 Other specified postprocedural states: Secondary | ICD-10-CM

## 2014-05-03 NOTE — Patient Instructions (Signed)
10 weeks post op. Done well.  Stay in Tennis shoes with inserts. Return as needed.

## 2014-05-03 NOTE — Progress Notes (Signed)
10 weeks post op following Cotton osteotomy with bone graft. Doing well with CAM walker. No complaints. Good plantar flexed first ray with normal range of motion first MPJ right. May return to regular shoes, tennis shoes with OTC orthotics and resume regular activity. Return as needed.

## 2014-07-04 ENCOUNTER — Ambulatory Visit (INDEPENDENT_AMBULATORY_CARE_PROVIDER_SITE_OTHER): Payer: Self-pay | Admitting: Podiatry

## 2014-07-04 ENCOUNTER — Encounter: Payer: Self-pay | Admitting: Podiatry

## 2014-07-04 VITALS — Ht 67.0 in | Wt 243.0 lb

## 2014-07-04 DIAGNOSIS — M216X9 Other acquired deformities of unspecified foot: Secondary | ICD-10-CM

## 2014-07-04 DIAGNOSIS — M21969 Unspecified acquired deformity of unspecified lower leg: Secondary | ICD-10-CM

## 2014-07-04 NOTE — Patient Instructions (Signed)
Both feet casted for orthotics. 

## 2014-07-04 NOTE — Progress Notes (Signed)
Subjective:  Feet are still hurting. Been trying OTC orthotics.  Status post Cotton osteotomy with bone graft.  Pain is at TNJ medial aspect on right instep area.   Assessment:  Tenosynovitis TNJ plantar medial right.  Left foot hurts at the lesser MPJ area.   Plan: Casted for Orthotics.

## 2014-08-25 ENCOUNTER — Ambulatory Visit (INDEPENDENT_AMBULATORY_CARE_PROVIDER_SITE_OTHER): Payer: Medicaid Other | Admitting: Podiatry

## 2014-08-25 ENCOUNTER — Encounter: Payer: Self-pay | Admitting: Podiatry

## 2014-08-25 DIAGNOSIS — M216X9 Other acquired deformities of unspecified foot: Secondary | ICD-10-CM

## 2014-08-25 DIAGNOSIS — M659 Synovitis and tenosynovitis, unspecified: Secondary | ICD-10-CM

## 2014-08-25 NOTE — Patient Instructions (Signed)
Seen for pain in right ankle. Ankle brace dispensed. Pain decreased. Use ankle brace as needed. Return as needed.

## 2014-08-25 NOTE — Progress Notes (Signed)
Subjective:  Having pain in right ankle lateral aspect. Status post Cotton osteotomy with bone graft.   Objective: Excess STJ pronation. Post Cotton osteotomy site has healed well with corrected bone position first ray.  Assessment:  Hyperpronating STJ with lateral ankle pain.  Plan: Ankle brace dispensed. Feeling better with the brace. If pain continues, it may require STJ Arthroereisis.

## 2014-10-19 ENCOUNTER — Other Ambulatory Visit: Payer: Self-pay | Admitting: Obstetrics and Gynecology

## 2014-10-19 DIAGNOSIS — R928 Other abnormal and inconclusive findings on diagnostic imaging of breast: Secondary | ICD-10-CM

## 2014-10-25 ENCOUNTER — Ambulatory Visit
Admission: RE | Admit: 2014-10-25 | Discharge: 2014-10-25 | Disposition: A | Discharge status: Home / Self Care | Payer: Medicaid Other | Source: Ambulatory Visit | Attending: Obstetrics and Gynecology | Admitting: Obstetrics and Gynecology

## 2014-10-25 DIAGNOSIS — R928 Other abnormal and inconclusive findings on diagnostic imaging of breast: Secondary | ICD-10-CM

## 2015-03-06 ENCOUNTER — Other Ambulatory Visit: Payer: Self-pay | Admitting: Cardiology

## 2015-03-06 DIAGNOSIS — R079 Chest pain, unspecified: Secondary | ICD-10-CM

## 2015-03-14 ENCOUNTER — Encounter (HOSPITAL_COMMUNITY)
Admission: RE | Admit: 2015-03-14 | Discharge: 2015-03-14 | Disposition: A | Discharge status: Home / Self Care | Payer: Medicaid Other | Source: Ambulatory Visit | Attending: Cardiology | Admitting: Cardiology

## 2015-03-14 DIAGNOSIS — R079 Chest pain, unspecified: Secondary | ICD-10-CM

## 2015-03-14 MED ORDER — REGADENOSON 0.4 MG/5ML IV SOLN
INTRAVENOUS | Status: AC
  Filled 2015-03-14: qty 5

## 2015-03-14 MED ORDER — TECHNETIUM TC 99M SESTAMIBI GENERIC - CARDIOLITE
10.0000 | Freq: Once | INTRAVENOUS | Status: AC | PRN
  Administered 2015-03-14: 10 via INTRAVENOUS

## 2015-03-14 MED ORDER — REGADENOSON 0.4 MG/5ML IV SOLN
0.4000 mg | Freq: Once | INTRAVENOUS | Status: AC
  Administered 2015-03-14: 0.4 mg via INTRAVENOUS

## 2015-03-14 MED ORDER — TECHNETIUM TC 99M SESTAMIBI GENERIC - CARDIOLITE
30.0000 | Freq: Once | INTRAVENOUS | Status: AC | PRN
  Administered 2015-03-14: 30 via INTRAVENOUS

## 2015-03-28 ENCOUNTER — Other Ambulatory Visit: Payer: Self-pay | Admitting: Obstetrics and Gynecology

## 2015-03-28 DIAGNOSIS — N6310 Unspecified lump in the right breast, unspecified quadrant: Secondary | ICD-10-CM

## 2015-06-19 ENCOUNTER — Ambulatory Visit
Admission: RE | Admit: 2015-06-19 | Discharge: 2015-06-19 | Disposition: A | Discharge status: Home / Self Care | Payer: Medicaid Other | Source: Ambulatory Visit | Attending: Obstetrics and Gynecology | Admitting: Obstetrics and Gynecology

## 2015-06-19 DIAGNOSIS — N6310 Unspecified lump in the right breast, unspecified quadrant: Secondary | ICD-10-CM

## 2015-12-19 ENCOUNTER — Other Ambulatory Visit: Payer: Self-pay | Admitting: Sports Medicine

## 2015-12-19 DIAGNOSIS — M25512 Pain in left shoulder: Secondary | ICD-10-CM

## 2015-12-27 ENCOUNTER — Ambulatory Visit
Admission: RE | Admit: 2015-12-27 | Discharge: 2015-12-27 | Disposition: A | Discharge status: Home / Self Care | Payer: Medicaid Other | Source: Ambulatory Visit | Attending: Sports Medicine | Admitting: Sports Medicine

## 2015-12-27 DIAGNOSIS — M25512 Pain in left shoulder: Secondary | ICD-10-CM

## 2016-01-10 NOTE — H&P (Signed)
Sandra Church is seen in consultation today at the request of Dr. Alfonso Ramus. There is relatively marked acute injury to the left shoulder but with accompanying chronic symptoms which have been going on for some period of time. Tolerable until this new acute injury. She was treated initially conservatively with a subacromial injection performed earlier this month.  This helped for a while but not a lot. The initial trauma was in early November. Her injection was performed at that time. With lack of improvement and a positive drop arm, she was subsequently sent and obtained an MRI scan.  This was done at [facility].  I looked at that report as well as the scan itself. She has a complete tear of the supraspinatus tendon retracted a good 2-3 cm. Almost completely torn infraspinatus also retracted 2-3 cm. Extensive tendinopathy throughout. Subscapularis not torn but a lot of tendinopathy. Biceps intact.  Of note there is mild to moderate supraspinatus and infraspinatus muscle atrophy already. She comes in to discuss definitive treatment.   History and general exam is reviewed.   Of note, she had a decompression and arthroscopically assisted rotator cuff repair in 2007. She has done quite well with that and has full motion, full function and good pain relief.    EXAMINATION: Lungs clear to auscultation bilaterally.  Heart sounds normal.  Full motion and good strength on the right. Fairly good passive motion on the left. She has a drop arm with no power with abduction and no power going overhead. There is no instability. I think the biceps is intact. There is some atrophy in the supraspinatus and infraspinatus. This is not extreme.  X-RAYS: I got a three view X-ray today which shows superior migration of the humeral head, Type II-III acromion.   PLAN:  I have explained the dilemma to Sandra Church and her husband. She is still young at 65. My concern here is whether or not this is repairable. There is no question  that there was a significant traumatic event within the last 4-6 weeks, but underlying that I think there may have been at least a partial tear prior to that. I think the sooner she does this the better. I spent more than 40 minutes with her face to face covering everything with her. The plan is for an exam under anesthesia, arthroscopy, and decompression and then hopefully arthroscopically assisted rotator cuff repair. I explained the atrophy. I explained the retraction. I also talked about potential treatment with a reverse shoulder if we cannot get a repair. She understands and agrees. Paperwork completed. All questions were encouraged and answered. I will see her at the time of operative intervention.

## 2016-01-18 ENCOUNTER — Encounter (HOSPITAL_BASED_OUTPATIENT_CLINIC_OR_DEPARTMENT_OTHER): Payer: Self-pay | Admitting: *Deleted

## 2016-01-22 ENCOUNTER — Encounter (HOSPITAL_BASED_OUTPATIENT_CLINIC_OR_DEPARTMENT_OTHER)
Admission: RE | Admit: 2016-01-22 | Discharge: 2016-01-22 | Disposition: A | Discharge status: Home / Self Care | Payer: Medicaid Other | Source: Ambulatory Visit | Attending: Orthopedic Surgery | Admitting: Orthopedic Surgery

## 2016-01-22 DIAGNOSIS — Z79899 Other long term (current) drug therapy: Secondary | ICD-10-CM | POA: Diagnosis not present

## 2016-01-22 DIAGNOSIS — M19012 Primary osteoarthritis, left shoulder: Secondary | ICD-10-CM | POA: Diagnosis not present

## 2016-01-22 DIAGNOSIS — S46012A Strain of muscle(s) and tendon(s) of the rotator cuff of left shoulder, initial encounter: Secondary | ICD-10-CM | POA: Diagnosis present

## 2016-01-22 DIAGNOSIS — E669 Obesity, unspecified: Secondary | ICD-10-CM | POA: Diagnosis not present

## 2016-01-22 DIAGNOSIS — I10 Essential (primary) hypertension: Secondary | ICD-10-CM | POA: Diagnosis not present

## 2016-01-22 DIAGNOSIS — Z833 Family history of diabetes mellitus: Secondary | ICD-10-CM | POA: Diagnosis not present

## 2016-01-22 DIAGNOSIS — Z7982 Long term (current) use of aspirin: Secondary | ICD-10-CM | POA: Diagnosis not present

## 2016-01-22 DIAGNOSIS — Z6836 Body mass index (BMI) 36.0-36.9, adult: Secondary | ICD-10-CM | POA: Diagnosis not present

## 2016-01-22 DIAGNOSIS — K219 Gastro-esophageal reflux disease without esophagitis: Secondary | ICD-10-CM | POA: Diagnosis not present

## 2016-01-22 DIAGNOSIS — F419 Anxiety disorder, unspecified: Secondary | ICD-10-CM | POA: Diagnosis not present

## 2016-01-22 DIAGNOSIS — Z885 Allergy status to narcotic agent status: Secondary | ICD-10-CM | POA: Diagnosis not present

## 2016-01-22 DIAGNOSIS — Z9889 Other specified postprocedural states: Secondary | ICD-10-CM | POA: Diagnosis not present

## 2016-01-22 DIAGNOSIS — M797 Fibromyalgia: Secondary | ICD-10-CM | POA: Diagnosis not present

## 2016-01-22 DIAGNOSIS — Z8249 Family history of ischemic heart disease and other diseases of the circulatory system: Secondary | ICD-10-CM | POA: Diagnosis not present

## 2016-01-22 DIAGNOSIS — Z91013 Allergy to seafood: Secondary | ICD-10-CM | POA: Diagnosis not present

## 2016-01-22 DIAGNOSIS — Z79891 Long term (current) use of opiate analgesic: Secondary | ICD-10-CM | POA: Diagnosis not present

## 2016-01-22 DIAGNOSIS — X58XXXA Exposure to other specified factors, initial encounter: Secondary | ICD-10-CM | POA: Diagnosis not present

## 2016-01-22 DIAGNOSIS — M85812 Other specified disorders of bone density and structure, left shoulder: Secondary | ICD-10-CM | POA: Diagnosis not present

## 2016-01-22 DIAGNOSIS — Z96651 Presence of right artificial knee joint: Secondary | ICD-10-CM | POA: Diagnosis not present

## 2016-01-22 DIAGNOSIS — M1990 Unspecified osteoarthritis, unspecified site: Secondary | ICD-10-CM | POA: Diagnosis not present

## 2016-01-22 DIAGNOSIS — Z823 Family history of stroke: Secondary | ICD-10-CM | POA: Diagnosis not present

## 2016-01-22 LAB — BASIC METABOLIC PANEL
ANION GAP: 9 (ref 5–15)
BUN: 13 mg/dL (ref 6–20)
CALCIUM: 9.8 mg/dL (ref 8.9–10.3)
CO2: 25 mmol/L (ref 22–32)
CREATININE: 0.76 mg/dL (ref 0.44–1.00)
Chloride: 105 mmol/L (ref 101–111)
Glucose, Bld: 117 mg/dL — ABNORMAL HIGH (ref 65–99)
Potassium: 4.2 mmol/L (ref 3.5–5.1)
SODIUM: 139 mmol/L (ref 135–145)

## 2016-01-24 ENCOUNTER — Encounter (HOSPITAL_BASED_OUTPATIENT_CLINIC_OR_DEPARTMENT_OTHER)
Admission: RE | Disposition: A | Discharge status: Home / Self Care | Payer: Self-pay | Source: Ambulatory Visit | Attending: Orthopedic Surgery

## 2016-01-24 ENCOUNTER — Encounter (HOSPITAL_BASED_OUTPATIENT_CLINIC_OR_DEPARTMENT_OTHER): Payer: Self-pay | Admitting: *Deleted

## 2016-01-24 ENCOUNTER — Ambulatory Visit (HOSPITAL_BASED_OUTPATIENT_CLINIC_OR_DEPARTMENT_OTHER): Payer: Medicaid Other | Admitting: Anesthesiology

## 2016-01-24 ENCOUNTER — Ambulatory Visit (HOSPITAL_BASED_OUTPATIENT_CLINIC_OR_DEPARTMENT_OTHER)
Admission: RE | Admit: 2016-01-24 | Discharge: 2016-01-24 | Disposition: A | Discharge status: Home / Self Care | Payer: Medicaid Other | Source: Ambulatory Visit | Attending: Orthopedic Surgery | Admitting: Orthopedic Surgery

## 2016-01-24 DIAGNOSIS — Z823 Family history of stroke: Secondary | ICD-10-CM | POA: Insufficient documentation

## 2016-01-24 DIAGNOSIS — Z96651 Presence of right artificial knee joint: Secondary | ICD-10-CM | POA: Insufficient documentation

## 2016-01-24 DIAGNOSIS — Z6836 Body mass index (BMI) 36.0-36.9, adult: Secondary | ICD-10-CM | POA: Insufficient documentation

## 2016-01-24 DIAGNOSIS — S46012A Strain of muscle(s) and tendon(s) of the rotator cuff of left shoulder, initial encounter: Secondary | ICD-10-CM | POA: Diagnosis not present

## 2016-01-24 DIAGNOSIS — X58XXXA Exposure to other specified factors, initial encounter: Secondary | ICD-10-CM | POA: Insufficient documentation

## 2016-01-24 DIAGNOSIS — M85812 Other specified disorders of bone density and structure, left shoulder: Secondary | ICD-10-CM | POA: Insufficient documentation

## 2016-01-24 DIAGNOSIS — M19012 Primary osteoarthritis, left shoulder: Secondary | ICD-10-CM | POA: Diagnosis not present

## 2016-01-24 DIAGNOSIS — Z8249 Family history of ischemic heart disease and other diseases of the circulatory system: Secondary | ICD-10-CM | POA: Insufficient documentation

## 2016-01-24 DIAGNOSIS — K219 Gastro-esophageal reflux disease without esophagitis: Secondary | ICD-10-CM | POA: Insufficient documentation

## 2016-01-24 DIAGNOSIS — E669 Obesity, unspecified: Secondary | ICD-10-CM | POA: Insufficient documentation

## 2016-01-24 DIAGNOSIS — M1990 Unspecified osteoarthritis, unspecified site: Secondary | ICD-10-CM | POA: Insufficient documentation

## 2016-01-24 DIAGNOSIS — I10 Essential (primary) hypertension: Secondary | ICD-10-CM | POA: Diagnosis not present

## 2016-01-24 DIAGNOSIS — M797 Fibromyalgia: Secondary | ICD-10-CM | POA: Insufficient documentation

## 2016-01-24 DIAGNOSIS — Z9889 Other specified postprocedural states: Secondary | ICD-10-CM | POA: Insufficient documentation

## 2016-01-24 DIAGNOSIS — Z7982 Long term (current) use of aspirin: Secondary | ICD-10-CM | POA: Insufficient documentation

## 2016-01-24 DIAGNOSIS — Z79891 Long term (current) use of opiate analgesic: Secondary | ICD-10-CM | POA: Insufficient documentation

## 2016-01-24 DIAGNOSIS — Z91013 Allergy to seafood: Secondary | ICD-10-CM | POA: Insufficient documentation

## 2016-01-24 DIAGNOSIS — Z79899 Other long term (current) drug therapy: Secondary | ICD-10-CM | POA: Insufficient documentation

## 2016-01-24 DIAGNOSIS — F419 Anxiety disorder, unspecified: Secondary | ICD-10-CM | POA: Insufficient documentation

## 2016-01-24 DIAGNOSIS — Z833 Family history of diabetes mellitus: Secondary | ICD-10-CM | POA: Insufficient documentation

## 2016-01-24 DIAGNOSIS — Z885 Allergy status to narcotic agent status: Secondary | ICD-10-CM | POA: Insufficient documentation

## 2016-01-24 HISTORY — DX: Gastro-esophageal reflux disease without esophagitis: K21.9

## 2016-01-24 HISTORY — DX: Anemia, unspecified: D64.9

## 2016-01-24 HISTORY — PX: SHOULDER ARTHROSCOPY WITH ROTATOR CUFF REPAIR AND SUBACROMIAL DECOMPRESSION: SHX5686

## 2016-01-24 HISTORY — PX: SHOULDER ARTHROSCOPY WITH DISTAL CLAVICLE RESECTION: SHX5675

## 2016-01-24 HISTORY — DX: Anxiety disorder, unspecified: F41.9

## 2016-01-24 SURGERY — SHOULDER ARTHROSCOPY WITH ROTATOR CUFF REPAIR AND SUBACROMIAL DECOMPRESSION
Anesthesia: General | Site: Shoulder | Laterality: Left

## 2016-01-24 MED ORDER — FENTANYL CITRATE (PF) 100 MCG/2ML IJ SOLN
50.0000 ug | INTRAMUSCULAR | Status: DC | PRN
  Administered 2016-01-24 (×2): 50 ug via INTRAVENOUS

## 2016-01-24 MED ORDER — ONDANSETRON HCL 4 MG/2ML IJ SOLN
INTRAMUSCULAR | Status: DC | PRN
  Administered 2016-01-24: 4 mg via INTRAVENOUS

## 2016-01-24 MED ORDER — BUPIVACAINE-EPINEPHRINE (PF) 0.5% -1:200000 IJ SOLN
INTRAMUSCULAR | Status: DC | PRN
  Administered 2016-01-24: 20 mL via PERINEURAL

## 2016-01-24 MED ORDER — EPHEDRINE SULFATE 50 MG/ML IJ SOLN
INTRAMUSCULAR | Status: DC | PRN
  Administered 2016-01-24: 10 mg via INTRAVENOUS

## 2016-01-24 MED ORDER — ONDANSETRON HCL 4 MG PO TABS: 4.0000 mg | ORAL_TABLET | Freq: Three times a day (TID) | ORAL | 0 refills | Status: DC | PRN

## 2016-01-24 MED ORDER — MIDAZOLAM HCL 2 MG/2ML IJ SOLN
1.0000 mg | INTRAMUSCULAR | Status: DC | PRN
  Administered 2016-01-24: 2 mg via INTRAVENOUS

## 2016-01-24 MED ORDER — SUCCINYLCHOLINE CHLORIDE 20 MG/ML IJ SOLN
INTRAMUSCULAR | Status: DC | PRN
  Administered 2016-01-24: 100 mg via INTRAVENOUS

## 2016-01-24 MED ORDER — LIDOCAINE 2% (20 MG/ML) 5 ML SYRINGE
INTRAMUSCULAR | Status: AC
  Filled 2016-01-24: qty 5

## 2016-01-24 MED ORDER — FENTANYL CITRATE (PF) 100 MCG/2ML IJ SOLN: 25.0000 ug | INTRAMUSCULAR | Status: DC | PRN

## 2016-01-24 MED ORDER — LACTATED RINGERS IV SOLN
INTRAVENOUS | Status: DC
  Administered 2016-01-24: 09:00:00 via INTRAVENOUS

## 2016-01-24 MED ORDER — DEXAMETHASONE SODIUM PHOSPHATE 4 MG/ML IJ SOLN
INTRAMUSCULAR | Status: DC | PRN
  Administered 2016-01-24: 10 mg via INTRAVENOUS

## 2016-01-24 MED ORDER — SODIUM CHLORIDE 0.9 % IR SOLN
Status: DC | PRN
  Administered 2016-01-24: 2300 mL

## 2016-01-24 MED ORDER — CEFAZOLIN SODIUM-DEXTROSE 2-4 GM/100ML-% IV SOLN
INTRAVENOUS | Status: AC
  Filled 2016-01-24: qty 100

## 2016-01-24 MED ORDER — OXYCODONE-ACETAMINOPHEN 5-325 MG PO TABS: 1.0000 | ORAL_TABLET | ORAL | 0 refills | Status: DC | PRN

## 2016-01-24 MED ORDER — MIDAZOLAM HCL 2 MG/2ML IJ SOLN
INTRAMUSCULAR | Status: AC
  Filled 2016-01-24: qty 2

## 2016-01-24 MED ORDER — FENTANYL CITRATE (PF) 100 MCG/2ML IJ SOLN
INTRAMUSCULAR | Status: AC
  Filled 2016-01-24: qty 2

## 2016-01-24 MED ORDER — LACTATED RINGERS IV SOLN
INTRAVENOUS | Status: DC
  Administered 2016-01-24: 08:00:00 via INTRAVENOUS

## 2016-01-24 MED ORDER — SCOPOLAMINE 1 MG/3DAYS TD PT72: 1.0000 | MEDICATED_PATCH | Freq: Once | TRANSDERMAL | Status: DC | PRN

## 2016-01-24 MED ORDER — EPHEDRINE 5 MG/ML INJ
INTRAVENOUS | Status: AC
  Filled 2016-01-24: qty 10

## 2016-01-24 MED ORDER — PHENYLEPHRINE HCL 10 MG/ML IJ SOLN
INTRAMUSCULAR | Status: AC
  Filled 2016-01-24: qty 1

## 2016-01-24 MED ORDER — CHLORHEXIDINE GLUCONATE 4 % EX LIQD: 60.0000 mL | Freq: Once | CUTANEOUS | Status: DC

## 2016-01-24 MED ORDER — DEXAMETHASONE SODIUM PHOSPHATE 10 MG/ML IJ SOLN
INTRAMUSCULAR | Status: AC
  Filled 2016-01-24: qty 1

## 2016-01-24 MED ORDER — PROMETHAZINE HCL 25 MG/ML IJ SOLN: 6.2500 mg | INTRAMUSCULAR | Status: DC | PRN

## 2016-01-24 MED ORDER — ONDANSETRON HCL 4 MG/2ML IJ SOLN
INTRAMUSCULAR | Status: AC
  Filled 2016-01-24: qty 2

## 2016-01-24 MED ORDER — PHENYLEPHRINE HCL 10 MG/ML IJ SOLN
INTRAVENOUS | Status: DC | PRN
  Administered 2016-01-24: 50 ug/min via INTRAVENOUS

## 2016-01-24 MED ORDER — CEFAZOLIN SODIUM-DEXTROSE 2-4 GM/100ML-% IV SOLN
2.0000 g | INTRAVENOUS | Status: AC
  Administered 2016-01-24: 2 g via INTRAVENOUS

## 2016-01-24 MED ORDER — SUCCINYLCHOLINE CHLORIDE 200 MG/10ML IV SOSY
PREFILLED_SYRINGE | INTRAVENOUS | Status: AC
  Filled 2016-01-24: qty 10

## 2016-01-24 MED ORDER — LIDOCAINE HCL (CARDIAC) 20 MG/ML IV SOLN
INTRAVENOUS | Status: DC | PRN
  Administered 2016-01-24: 50 mg via INTRAVENOUS

## 2016-01-24 MED ORDER — PROPOFOL 10 MG/ML IV BOLUS
INTRAVENOUS | Status: DC | PRN
  Administered 2016-01-24: 150 mg via INTRAVENOUS

## 2016-01-24 SURGICAL SUPPLY — 76 items
AID PSTN UNV HD RSTRNT DISP (MISCELLANEOUS) ×2
ANCH SUT SWLK 19.1X4.75 (Anchor) ×6 IMPLANT
ANCH SUT SWLK 19.1X5.5 CLS EL (Anchor) ×4 IMPLANT
ANCHOR PEEK SWIVEL LOCK 5.5 (Anchor) ×8 IMPLANT
ANCHOR SUT BIO SW 4.75X19.1 (Anchor) ×12 IMPLANT
APL SKNCLS STERI-STRIP NONHPOA (GAUZE/BANDAGES/DRESSINGS)
BENZOIN TINCTURE PRP APPL 2/3 (GAUZE/BANDAGES/DRESSINGS) IMPLANT
BLADE CUTTER GATOR 3.5 (BLADE) ×4 IMPLANT
BLADE CUTTER MENIS 5.5 (BLADE) IMPLANT
BLADE GREAT WHITE 4.2 (BLADE) ×3 IMPLANT
BLADE GREAT WHITE 4.2MM (BLADE) ×1
BLADE SURG 15 STRL LF DISP TIS (BLADE) ×2 IMPLANT
BLADE SURG 15 STRL SS (BLADE) ×4
BUR OVAL 6.0 (BURR) ×4 IMPLANT
CANNULA DRY DOC 8X75 (CANNULA) ×4 IMPLANT
CANNULA TWIST IN 8.25X7CM (CANNULA) IMPLANT
CLOSURE WOUND 1/2 X4 (GAUZE/BANDAGES/DRESSINGS)
DECANTER SPIKE VIAL GLASS SM (MISCELLANEOUS) IMPLANT
DRAPE STERI 35X30 U-POUCH (DRAPES) ×4 IMPLANT
DRAPE U-SHAPE 47X51 STRL (DRAPES) ×4 IMPLANT
DRAPE U-SHAPE 76X120 STRL (DRAPES) ×8 IMPLANT
DRSG PAD ABDOMINAL 8X10 ST (GAUZE/BANDAGES/DRESSINGS) ×4 IMPLANT
DURAPREP 26ML APPLICATOR (WOUND CARE) ×4 IMPLANT
ELECT MENISCUS 165MM 90D (ELECTRODE) ×4 IMPLANT
ELECT REM PT RETURN 9FT ADLT (ELECTROSURGICAL) ×4
ELECTRODE REM PT RTRN 9FT ADLT (ELECTROSURGICAL) ×2 IMPLANT
GAUZE SPONGE 4X4 12PLY STRL (GAUZE/BANDAGES/DRESSINGS) ×8 IMPLANT
GAUZE XEROFORM 1X8 LF (GAUZE/BANDAGES/DRESSINGS) ×4 IMPLANT
GLOVE BIOGEL PI IND STRL 7.0 (GLOVE) ×6 IMPLANT
GLOVE BIOGEL PI INDICATOR 7.0 (GLOVE) ×6
GLOVE ECLIPSE 6.5 STRL STRAW (GLOVE) ×4 IMPLANT
GLOVE ECLIPSE 7.0 STRL STRAW (GLOVE) ×4 IMPLANT
GLOVE SURG ORTHO 8.0 STRL STRW (GLOVE) ×4 IMPLANT
GOWN STRL REUS W/ TWL LRG LVL3 (GOWN DISPOSABLE) ×2 IMPLANT
GOWN STRL REUS W/ TWL XL LVL3 (GOWN DISPOSABLE) ×4 IMPLANT
GOWN STRL REUS W/TWL LRG LVL3 (GOWN DISPOSABLE) ×4
GOWN STRL REUS W/TWL XL LVL3 (GOWN DISPOSABLE) ×8
IV NS IRRIG 3000ML ARTHROMATIC (IV SOLUTION) ×32 IMPLANT
MANIFOLD NEPTUNE II (INSTRUMENTS) ×4 IMPLANT
NDL SUT 6 .5 CRC .975X.05 MAYO (NEEDLE) IMPLANT
NEEDLE MAYO TAPER (NEEDLE)
NEEDLE SCORPION MULTI FIRE (NEEDLE) ×8 IMPLANT
NS IRRIG 1000ML POUR BTL (IV SOLUTION) IMPLANT
PACK ARTHROSCOPY DSU (CUSTOM PROCEDURE TRAY) ×4 IMPLANT
PASSER SUT SWANSON 36MM LOOP (INSTRUMENTS) IMPLANT
PENCIL BUTTON HOLSTER BLD 10FT (ELECTRODE) ×4 IMPLANT
RESTRAINT HEAD UNIVERSAL NS (MISCELLANEOUS) ×4 IMPLANT
SET ARTHROSCOPY TUBING (MISCELLANEOUS) ×4
SET ARTHROSCOPY TUBING LN (MISCELLANEOUS) ×2 IMPLANT
SLEEVE SCD COMPRESS KNEE MED (MISCELLANEOUS) ×4 IMPLANT
SLING ARM FOAM STRAP LRG (SOFTGOODS) IMPLANT
SLING ARM IMMOBILIZER LRG (SOFTGOODS) ×4 IMPLANT
SLING ARM IMMOBILIZER MED (SOFTGOODS) IMPLANT
SLING ARM MED ADULT FOAM STRAP (SOFTGOODS) IMPLANT
SLING ARM XL FOAM STRAP (SOFTGOODS) IMPLANT
SPONGE LAP 4X18 X RAY DECT (DISPOSABLE) IMPLANT
STRIP CLOSURE SKIN 1/2X4 (GAUZE/BANDAGES/DRESSINGS) IMPLANT
SUCTION FRAZIER HANDLE 10FR (MISCELLANEOUS)
SUCTION TUBE FRAZIER 10FR DISP (MISCELLANEOUS) IMPLANT
SUT ETHIBOND 2 OS 4 DA (SUTURE) IMPLANT
SUT ETHILON 2 0 FS 18 (SUTURE) IMPLANT
SUT ETHILON 3 0 PS 1 (SUTURE) ×4 IMPLANT
SUT FIBERWIRE #2 38 T-5 BLUE (SUTURE)
SUT RETRIEVER MED (INSTRUMENTS) IMPLANT
SUT TIGER TAPE 7 IN WHITE (SUTURE) ×8 IMPLANT
SUT VIC AB 0 CT1 27 (SUTURE)
SUT VIC AB 0 CT1 27XBRD ANBCTR (SUTURE) IMPLANT
SUT VIC AB 2-0 SH 27 (SUTURE)
SUT VIC AB 2-0 SH 27XBRD (SUTURE) IMPLANT
SUT VIC AB 3-0 FS2 27 (SUTURE) IMPLANT
SUTURE FIBERWR #2 38 T-5 BLUE (SUTURE) IMPLANT
TAPE FIBER 2MM 7IN #2 BLUE (SUTURE) ×8 IMPLANT
TOWEL OR 17X24 6PK STRL BLUE (TOWEL DISPOSABLE) ×4 IMPLANT
TOWEL OR NON WOVEN STRL DISP B (DISPOSABLE) ×4 IMPLANT
WATER STERILE IRR 1000ML POUR (IV SOLUTION) ×4 IMPLANT
YANKAUER SUCT BULB TIP NO VENT (SUCTIONS) IMPLANT

## 2016-01-24 NOTE — Anesthesia Postprocedure Evaluation (Signed)
Anesthesia Post Note  Patient: Sandra Church  Procedure(s) Performed: Procedure(s) (LRB): LEFT SHOULDER ARTHROSCOPY ACROMIOPLASTY, CLAVICLECTOMY, ARTHROSCOPIC ROTATOR CUFF REPAIR (Left) SHOULDER ARTHROSCOPY WITH DISTAL CLAVICLE RESECTION (Left)  Patient location during evaluation: PACU Anesthesia Type: General and Regional Level of consciousness: awake and alert Pain management: pain level controlled Vital Signs Assessment: post-procedure vital signs reviewed and stable Respiratory status: spontaneous breathing, nonlabored ventilation, respiratory function stable and patient connected to nasal cannula oxygen Cardiovascular status: blood pressure returned to baseline and stable Postop Assessment: no signs of nausea or vomiting Anesthetic complications: no       Last Vitals:  Vitals:   01/24/16 1200 01/24/16 1230  BP: 127/80 (!) 149/87  Pulse: 77 87  Resp: 15 18  Temp:  37.2 C    Last Pain:  Vitals:   01/24/16 1200  TempSrc:   PainSc: 0-No pain                 Catalina Gravel

## 2016-01-24 NOTE — Anesthesia Procedure Notes (Signed)
Anesthesia Regional Block:  Interscalene brachial plexus block  Pre-Anesthetic Checklist: ,, timeout performed, Correct Patient, Correct Site, Correct Laterality, Correct Procedure, Correct Position, site marked, Risks and benefits discussed,  Surgical consent,  Pre-op evaluation,  At surgeon's request and post-op pain management  Laterality: Left  Prep: chloraprep       Needles:  Injection technique: Single-shot  Needle Type: Echogenic Stimulator Needle     Needle Length: 5cm 5 cm Needle Gauge: 22 and 22 G    Additional Needles:  Procedures: ultrasound guided (picture in chart) Interscalene brachial plexus block Narrative:  Start time: 01/24/2016 8:18 AM End time: 01/24/2016 8:23 AM Injection made incrementally with aspirations every 5 mL.  Performed by: Personally  Anesthesiologist: Catalina Gravel  Additional Notes: Functioning IV was confirmed and monitors were applied.  A 14mm 22ga Arrow echogenic stimulator needle was used. Sterile prep and drape, hand hygiene, and sterile gloves were used.  Negative aspiration and negative test dose prior to incremental administration of local anesthetic. The patient tolerated the procedure well.  Ultrasound guidance: relevent anatomy identified, needle position confirmed, local anesthetic spread visualized around nerve(s), vascular puncture avoided.  Image printed for medical record.

## 2016-01-24 NOTE — Progress Notes (Signed)
Assisted Dr. Gifford Shave with left, ultrasound guided, interscalene block. Side rails up, monitors on throughout procedure. See vital signs in flow sheet. Tolerated Procedure well.

## 2016-01-24 NOTE — Transfer of Care (Signed)
Immediate Anesthesia Transfer of Care Note  Patient: Sandra Church  Procedure(s) Performed: Procedure(s): LEFT SHOULDER ARTHROSCOPY ACROMIOPLASTY, CLAVICLECTOMY, ARTHROSCOPIC ROTATOR CUFF REPAIR (Left) SHOULDER ARTHROSCOPY WITH DISTAL CLAVICLE RESECTION (Left)  Patient Location: PACU  Anesthesia Type:General  Level of Consciousness: awake and sedated  Airway & Oxygen Therapy: Patient Spontanous Breathing and Patient connected to face mask oxygen  Post-op Assessment: Report given to RN and Post -op Vital signs reviewed and stable  Post vital signs: Reviewed and stable  Last Vitals:  Vitals:   01/24/16 0825 01/24/16 0830  BP: 123/83 122/87  Pulse: 72 79  Resp: 13 (!) 26  Temp:      Last Pain:  Vitals:   01/24/16 0746  TempSrc: Oral         Complications: No apparent anesthesia complications

## 2016-01-24 NOTE — Anesthesia Preprocedure Evaluation (Signed)
Anesthesia Evaluation  Patient identified by MRN, date of birth, ID band Patient awake    Reviewed: Allergy & Precautions, NPO status , Patient's Chart, lab work & pertinent test results, reviewed documented beta blocker date and time   Airway Mallampati: II  TM Distance: >3 FB Neck ROM: Full    Dental  (+) Teeth Intact, Dental Advisory Given   Pulmonary neg pulmonary ROS,    Pulmonary exam normal breath sounds clear to auscultation       Cardiovascular hypertension, Pt. on medications and Pt. on home beta blockers Normal cardiovascular exam Rhythm:Regular Rate:Normal     Neuro/Psych PSYCHIATRIC DISORDERS Anxiety negative neurological ROS     GI/Hepatic Neg liver ROS, GERD  Medicated,  Endo/Other  Obesity   Renal/GU negative Renal ROS     Musculoskeletal  (+) Arthritis , Fibromyalgia -, narcotic dependent  Abdominal   Peds  Hematology negative hematology ROS (+)   Anesthesia Other Findings Day of surgery medications reviewed with the patient.  Reproductive/Obstetrics                             Anesthesia Physical Anesthesia Plan  ASA: II  Anesthesia Plan: General   Post-op Pain Management: GA combined w/ Regional for post-op pain   Induction: Intravenous  Airway Management Planned: Oral ETT  Additional Equipment:   Intra-op Plan:   Post-operative Plan: Extubation in OR  Informed Consent: I have reviewed the patients History and Physical, chart, labs and discussed the procedure including the risks, benefits and alternatives for the proposed anesthesia with the patient or authorized representative who has indicated his/her understanding and acceptance.   Dental advisory given  Plan Discussed with: CRNA  Anesthesia Plan Comments: (Risks/benefits of general anesthesia discussed with patient including risk of damage to teeth, lips, gum, and tongue, nausea/vomiting, allergic  reactions to medications, and the possibility of heart attack, stroke and death.  All patient questions answered.  Patient wishes to proceed.  Discussed risks and benefits of interscalene block including failure, bleeding, infection, nerve damage, weakness, shortness of breath, pneumothorax. Questions answered. Patient consents to block. )        Anesthesia Quick Evaluation

## 2016-01-24 NOTE — Discharge Instructions (Signed)
Shouder arthroscopy, rotator cuff repair, subacromial decompression °Care After Instructions °Refer to this sheet in the next few weeks. These discharge instructions provide you with general information on caring for yourself after you leave the hospital. Your caregiver may also give you specific instructions. Your treatment has been planned according to the most current medical practices available, but unavoidable complications sometimes occur. If you have any problems or questions after discharge, please call your caregiver. °HOME INSTRUCTIONS °You may resume a normal diet and activities as directed.  °Take showers instead of baths until informed otherwise.  °Change bandages (dressings) in 3 days.  Swab wounds daily with betadine.  Wash shoulder with soap and water.  Pat dry.  Cover wounds with bandaids. °Only take over-the-counter or prescription medicines for pain, discomfort, or fever as directed by your caregiver.  °Wear your sling for the next 6 weeks unless otherwise instructed. °Eat a well-balanced diet.  °Avoid lifting or driving until you are instructed otherwise.  °Make an appointment to see your caregiver for stitches (suture) or staple removal one week after surgery.  ° °SEEK MEDICAL CARE IF: °You have swelling of your calf or leg.  °You develop shortness of breath or chest pain.  °You have redness, swelling, or increasing pain in the wound.  °There is pus or any unusual drainage coming from the surgical site.  °You notice a bad smell coming from the surgical site or dressing.  °The surgical site breaks open after sutures or staples have been removed.  °There is persistent bleeding from the suture or staple line.  °You are getting worse or are not improving.  °You have any other questions or concerns.  °SEEK IMMEDIATE MEDICAL CARE IF:  °You have a fever greater than 101 °You develop a rash.  °You have difficulty breathing.  °You develop any reaction or side effects to medicines given.  °Your knee  motion is decreasing rather than improving.  °MAKE SURE YOU:  °Understand these instructions.  °Will watch your condition.  °Will get help right away if you are not doing well or get worse.  ° ° ° °Regional Anesthesia Blocks ° °1. Numbness or the inability to move the "blocked" extremity may last from 3-48 hours after placement. The length of time depends on the medication injected and your individual response to the medication. If the numbness is not going away after 48 hours, call your surgeon. ° °2. The extremity that is blocked will need to be protected until the numbness is gone and the  Strength has returned. Because you cannot feel it, you will need to take extra care to avoid injury. Because it may be weak, you may have difficulty moving it or using it. You may not know what position it is in without looking at it while the block is in effect. ° °3. For blocks in the legs and feet, returning to weight bearing and walking needs to be done carefully. You will need to wait until the numbness is entirely gone and the strength has returned. You should be able to move your leg and foot normally before you try and bear weight or walk. You will need someone to be with you when you first try to ensure you do not fall and possibly risk injury. ° °4. Bruising and tenderness at the needle site are common side effects and will resolve in a few days. ° °5. Persistent numbness or new problems with movement should be communicated to the surgeon or the [facility] (  [phone])/ [facility] ([phone]). ° ° ° ° ° ° °Post Anesthesia Home Care Instructions ° °Activity: °Get plenty of rest for the remainder of the day. A responsible adult should stay with you for 24 hours following the procedure.  °For the next 24 hours, DO NOT: °-Drive a car °-Operate machinery °-Drink alcoholic beverages °-Take any medication unless instructed by your physician °-Make any legal decisions or sign important  papers. ° °Meals: °Start with liquid foods such as gelatin or soup. Progress to regular foods as tolerated. Avoid greasy, spicy, heavy foods. If nausea and/or vomiting occur, drink only clear liquids until the nausea and/or vomiting subsides. Call your physician if vomiting continues. ° °Special Instructions/Symptoms: °Your throat may feel dry or sore from the anesthesia or the breathing tube placed in your throat during surgery. If this causes discomfort, gargle with warm salt water. The discomfort should disappear within 24 hours. ° °If you had a scopolamine patch placed behind your ear for the management of post- operative nausea and/or vomiting: ° °1. The medication in the patch is effective for 72 hours, after which it should be removed.  Wrap patch in a tissue and discard in the trash. Wash hands thoroughly with soap and water. °2. You may remove the patch earlier than 72 hours if you experience unpleasant side effects which may include dry mouth, dizziness or visual disturbances. °3. Avoid touching the patch. Wash your hands with soap and water after contact with the patch. °  ° °

## 2016-01-24 NOTE — Anesthesia Procedure Notes (Signed)
Procedure Name: Intubation Performed by: Maytte Jacot W Pre-anesthesia Checklist: Patient identified, Emergency Drugs available, Suction available and Patient being monitored Patient Re-evaluated:Patient Re-evaluated prior to inductionOxygen Delivery Method: Circle system utilized Preoxygenation: Pre-oxygenation with 100% oxygen Intubation Type: IV induction Ventilation: Mask ventilation without difficulty Laryngoscope Size: Miller and 2 Grade View: Grade I Tube type: Oral Tube size: 7.0 mm Number of attempts: 1 Airway Equipment and Method: Stylet and Oral airway Placement Confirmation: ETT inserted through vocal cords under direct vision,  positive ETCO2 and breath sounds checked- equal and bilateral Secured at: 23 cm Tube secured with: Tape Dental Injury: Teeth and Oropharynx as per pre-operative assessment        

## 2016-01-24 NOTE — Interval H&P Note (Signed)
History and Physical Interval Note:  01/24/2016 7:30 AM  Sandra Church  has presented today for surgery, with the diagnosis of LEFT SHOULDER OSTEOARTRITIS,BURSITIS,COMPLETE ROTATOR CUFF TEAR OR RUPTURE   The various methods of treatment have been discussed with the patient and family. After consideration of risks, benefits and other options for treatment, the patient has consented to  Procedure(s): LEFT SHOULDER ARTHROSCOPY ACROMIOPLASTY, CLAVICLECTOMY,ROTATOR CUFF REPAIR,BICEPS TENODESIS (Left) as a surgical intervention .  The patient's history has been reviewed, patient examined, no change in status, stable for surgery.  I have reviewed the patient's chart and labs.  Questions were answered to the patient's satisfaction.     [name]

## 2016-01-25 ENCOUNTER — Encounter (HOSPITAL_BASED_OUTPATIENT_CLINIC_OR_DEPARTMENT_OTHER): Payer: Self-pay | Admitting: Orthopedic Surgery

## 2016-01-25 NOTE — Op Note (Signed)
NAME:  Sandra Church, Sandra Church NO.:  [id number]  MEDICAL RECORD NO.:  [id number]  LOCATION:                                 FACILITY:  PHYSICIAN:  [name], M.D. DATE OF BIRTH:  June 21, 1960  DATE OF PROCEDURE:  01/24/2016 DATE OF DISCHARGE:  01/24/2016                              OPERATIVE REPORT   PREOPERATIVE DIAGNOSES:  Left shoulder massive retracted rotator cuff tear supra and infraspinatus tendon. Subacute.  Impingement.  Degenerative joint disease of AC joint.  POSTOPERATIVE DIAGNOSES:  Left shoulder massive retracted rotator cuff tear supra and infraspinatus tendon. Subacute.  Impingement.  Degenerative joint disease of AC joint with a massive retracted tear, but still reparable.  Osteopenia greater tuberosity.  DESCRIPTION OF PROCEDURE:  Left shoulder exam under anesthesia, arthroscopy.  Debridement, mobilization of rotator cuff tear. Bursectomy, acromioplasty, coracoacromial ligament release.  Excision of distal clavicle.  Arthroscopic-assisted rotator cuff repair.  Utilizing FiberWire suture x3, SwiveLock anchors x3, 2 of the anchors over the tuberosity and the lateral shaft.  SURGEON-Kendyll Huettner:  [name], M.D.  ASSISTANT:  Elmyra Ricks, PA present throughout the entire case and necessary for timely completion of procedure.  ANESTHESIA:  General.  BLOOD LOSS:  Minimal.  SPECIMENS:  None.  CULTURES:  None.  COMPLICATION:  None.  DRESSINGS:  Shoulder immobilizer.  DESCRIPTION OF PROCEDURE:  The patient was brought to the operating room and after adequate anesthesia had been obtained, placed in the beach- chair position, shoulder positioner, prepped and draped in usual sterile fashion.  Full motion stable shoulder.  Three portals anterior, posterior, and lateral.  Arthroscope introduced, shoulder distended and inspected.  Glenohumeral joint minimal degenerative change.  Labrum intact.  Biceps tendon intact and within the groove.   Entire supraspinatus, top of the infraspinatus torn off, pulled the way almost 3 cm.  Despite that, this was still relatively mobile, once it was freed up and debrided above and below I can get it out laterally.  Tuberosity roughened, but the bone was very soft in that area.  From above, acromioplasty from type 2 to type 1 acromion releasing CA ligament. Grade 4 changes spurring AC joint.  Periarticular spurs, lateral centimeter of clavicle resected.  The lateral cannula and the cuff were captured with three horizontal mattress sutures with the scorpion device.  The tip of the Scorpion __________ was retrieved that there was a new needle inserted.  The cuff was reasonable tissue quality.  The two __________ sutures anchored the supraspinatus down into the tuberosity with SwiveLock anchors.  The back sutured, anchored the infraspinatus down, around the side of the humerus to end of the shaft with another SwiveLock anchor.  When that was complete, I looked back at the front and the front anchor had already pulled out.  That anchor was driven down further the suture removed.  A new suture placed, then I brought this up beyond that insertion site down into the lateral shaft and re- anchored with a larger 5.5 SwiveLock anchor.  At completion, I had a nice firm watertight closure of the entire cuff.  Adequacy of decompression confirmed.  Instruments and fluid were removed.  Portals were closed with nylon.  Sterile compressive  dressing applied.  Shoulder immobilizer applied.  Anesthesia reversed.  Brought to the recovery room.  Tolerated the surgery well.  No complications.     [name], M.D.   ______________________________ [name], M.D.    DFM/MEDQ  D:  01/24/2016  T:  01/25/2016  Job:  [id number]

## 2016-02-25 ENCOUNTER — Ambulatory Visit: Payer: Medicaid Other | Admitting: Physical Therapy

## 2016-02-29 ENCOUNTER — Emergency Department (HOSPITAL_COMMUNITY)
Admission: EM | Admit: 2016-02-29 | Discharge: 2016-02-29 | Disposition: A | Discharge status: Home / Self Care | Payer: Medicaid Other | Attending: Emergency Medicine | Admitting: Emergency Medicine

## 2016-02-29 DIAGNOSIS — Z79899 Other long term (current) drug therapy: Secondary | ICD-10-CM | POA: Diagnosis not present

## 2016-02-29 DIAGNOSIS — T783XXA Angioneurotic edema, initial encounter: Secondary | ICD-10-CM | POA: Insufficient documentation

## 2016-02-29 DIAGNOSIS — T7840XA Allergy, unspecified, initial encounter: Secondary | ICD-10-CM | POA: Diagnosis present

## 2016-02-29 DIAGNOSIS — I10 Essential (primary) hypertension: Secondary | ICD-10-CM | POA: Insufficient documentation

## 2016-02-29 DIAGNOSIS — L509 Urticaria, unspecified: Secondary | ICD-10-CM

## 2016-02-29 MED ORDER — PREDNISONE 10 MG (21) PO TBPK: 10.0000 mg | ORAL_TABLET | Freq: Every day | ORAL | 0 refills | Status: DC

## 2016-02-29 MED ORDER — DIPHENHYDRAMINE HCL 25 MG PO CAPS: 25.0000 mg | ORAL_CAPSULE | Freq: Four times a day (QID) | ORAL | 0 refills | Status: DC | PRN

## 2016-02-29 MED ORDER — SODIUM CHLORIDE 0.9 % IV BOLUS (SEPSIS)
1000.0000 mL | Freq: Once | INTRAVENOUS | Status: AC
  Administered 2016-02-29: 1000 mL via INTRAVENOUS

## 2016-02-29 MED ORDER — DIPHENHYDRAMINE HCL 50 MG/ML IJ SOLN
25.0000 mg | Freq: Once | INTRAMUSCULAR | Status: AC
  Administered 2016-02-29: 25 mg via INTRAVENOUS
  Filled 2016-02-29: qty 1

## 2016-02-29 MED ORDER — PREDNISONE 20 MG PO TABS
60.0000 mg | ORAL_TABLET | Freq: Once | ORAL | Status: AC
  Administered 2016-02-29: 60 mg via ORAL
  Filled 2016-02-29: qty 3

## 2016-02-29 MED ORDER — DIPHENHYDRAMINE HCL 25 MG PO CAPS
ORAL_CAPSULE | ORAL | Status: AC
  Filled 2016-02-29: qty 2

## 2016-02-29 MED ORDER — DIPHENHYDRAMINE HCL 25 MG PO CAPS
50.0000 mg | ORAL_CAPSULE | Freq: Once | ORAL | Status: AC
  Administered 2016-02-29: 50 mg via ORAL

## 2016-02-29 NOTE — ED Provider Notes (Signed)
Patient care assumed from [facility], PA-C at shift change. Please see his note for further.  Briefly, the patient presented with allergic reaction. Plan at shift change was for reevaluation and discharge if patient has no problems with breathing or swallowing.  At my evaluation the patient reports she is feeling much better. She denies any tongue or throat swelling. She denies any trouble swallowing or breathing. She reports she feels her upper lip swelling has improved. Husband reports that her upper lip swelling has greatly improved. She has no drooling. She is breathing normally. Oxygen saturation is 98% on room air. She tells me she feels ready for discharge. We'll discharge with plan set forth by PA Hedges. I discussed return precautions with the patient. I advised the patient to follow-up with their primary care provider this week. I advised the patient to return to the emergency department with new or worsening symptoms or new concerns. The patient verbalized understanding and agreement with plan.    Vitals:   02/29/16 2030 02/29/16 2045 02/29/16 2100 02/29/16 2115  BP: 120/78 126/85 124/82 134/88  Pulse: 70 80 82 84  Resp: 20 14 16 17   Temp:      TempSrc:      SpO2: 98% 99% 99% 99%     Angioedema, initial encounter  Hives     Waynetta Pean, PA-C 02/29/16 2144    Lacretia Leigh, MD 03/03/16 0003

## 2016-02-29 NOTE — ED Notes (Signed)
Pt departed in NAD.  

## 2016-02-29 NOTE — ED Triage Notes (Signed)
Pt complaining of allergic reaction to unknown substance. Pt states started yesterday. Pt states took benadryl yesterday, no change. Pt with no obvious rash or swelling to torso or extremities. Pt states some itching to lower buttocks and thighs. No stridor or SOB noted at triage.

## 2016-02-29 NOTE — ED Provider Notes (Signed)
[facility] DEPT Provider Note   CSN: [id number] Arrival date & time: 02/29/16  1504     History   Chief Complaint Chief Complaint  Patient presents with  . Allergic Reaction    HPI Sandra Church is a 56 y.o. female.  HPI   56 year old female presents today with complaints of allergic reaction.  Patient notes she had influenza last week and has been improving.  She reports she was feeling well 4 days ago, drinking Pedialyte.  She notes 1 hour after drinking the Pedialyte she developed urticaria to the lower extremities.  She notes that over the last several days she has had extreme itching, urticaria on her back and torso with swelling to her top lip.  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  She notes no new medications other than vitamin D started 3 weeks ago.  Patient notes no new change in her blood pressure medication which includes losartan, amlodipine.  She denies any significant respiratory distress, she does not some dry itchy throat and tightening of her airways. Those symptoms have resolved.    Past Medical History:  Diagnosis Date  . Anemia   . Anxiety   . Arthritis   . Fibromyalgia   . GERD (gastroesophageal reflux disease)   . Hypertension   . Mitral valve problem   . Wears glasses     Patient Active Problem List   Diagnosis Date Noted  . Status post right foot surgery 04/12/2014  . 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 Pronation deformity of ankle, acquired 02/01/2014  . Tenosynovitis of right ankle 02/01/2014    Past Surgical History:  Procedure Laterality Date  . CLOSED MANIPULATION KNEE WITH STERIOD INJECTION  2011   rt  . Cotton Osteotomy with Bone Graft Right 02/23/2014   Rt foot @ [facility]  . DIAGNOSTIC LAPAROSCOPY    . FOOT BONE EXCISION     both feet-spurs  . FOOT SURGERY     plantar fascitis  . JOINT REPLACEMENT  2011   rt total knee replacement  . KNEE ARTHROPLASTY  2012   rt  . OPEN REDUCTION  INTERNAL FIXATION (ORIF) METACARPAL Left 07/06/2012   Procedure: LEFT HAND EXPLORATION WOUNDS, OPEN REDUCTION INTERNAL FIXATION SMALL METACARPAL FRACTURE;  Surgeon: Tennis Must, MD;  Location: [facility];  Service: Orthopedics;  Laterality: Left;  . REPAIR EXTENSOR TENDON Left 07/06/2012   Procedure: REPAIR EXTENSOR TENDON;  Surgeon: Tennis Must, MD;  Location: [facility];  Service: Orthopedics;  Laterality: Left;  Left   . SHOULDER ARTHROSCOPY WITH DISTAL CLAVICLE RESECTION Left 01/24/2016   Procedure: SHOULDER ARTHROSCOPY WITH DISTAL CLAVICLE RESECTION;  Surgeon: [name], MD;  Location: [facility];  Service: Orthopedics;  Laterality: Left;  . SHOULDER ARTHROSCOPY WITH ROTATOR CUFF REPAIR AND SUBACROMIAL DECOMPRESSION Left 01/24/2016   Procedure: LEFT SHOULDER ARTHROSCOPY ACROMIOPLASTY, CLAVICLECTOMY, ARTHROSCOPIC ROTATOR CUFF REPAIR;  Surgeon: [name], MD;  Location: [facility];  Service: Orthopedics;  Laterality: Left;  . SHOULDER SURGERY  2010   rt    OB History    No data available       Home Medications    Prior to Admission medications   Medication Sig Start Date End Date Taking? Authorizing Provider  ALPRAZolam Duanne Moron) 0.5 MG tablet  12/31/13   Historical Provider, MD  amLODipine (NORVASC) 5 MG tablet Take 5 mg by mouth daily. 12/24/13   Historical Provider, MD  Aspirin-Salicylamide-Caffeine (  BC FAST PAIN RELIEF ARTHRITIS) [id number] MG PACK Take 1 packet by mouth every 8 (eight) hours as needed. For pain    Historical Provider, MD  cyclobenzaprine (FLEXERIL) 5 MG tablet Take 5 mg by mouth 3 (three) times daily as needed for muscle spasms.    Historical Provider, MD  diphenhydrAMINE (BENADRYL) 25 mg capsule Take 1 capsule (25 mg total) by mouth every 6 (six) hours as needed. 02/29/16   Okey Regal, PA-C  losartan (COZAAR) 100 MG tablet Take 100 mg by mouth daily. 01/16/14   Historical Provider, MD    metoprolol succinate (TOPROL-XL) 100 MG 24 hr tablet Take 100 mg by mouth every morning. Take with or immediately following a meal.    Historical Provider, MD  omeprazole (PRILOSEC) 40 MG capsule  12/24/13   Historical Provider, MD  ondansetron (ZOFRAN) 4 MG tablet Take 1 tablet (4 mg total) by mouth every 8 (eight) hours as needed for nausea or vomiting. 01/24/16   Aundra Dubin, PA-C  oxyCODONE-acetaminophen (PERCOCET) 5-325 MG tablet Take 1-2 tablets by mouth every 4 (four) hours as needed for severe pain. 01/24/16   Aundra Dubin, PA-C  predniSONE (STERAPRED UNI-PAK 21 TAB) 10 MG (21) TBPK tablet Take 1 tablet (10 mg total) by mouth daily. Take 6 tabs by mouth daily  for 1 days, then 5 tabs for 1 days, then 4 tabs for 1 days, then 3 tabs for 1 days, 2 tabs for 2 days, then 1 tab by mouth daily for 1 day 02/29/16   Okey Regal, PA-C    Family History Family History  Problem Relation Age of Onset  . Diabetes Mother   . Stroke Mother   . Cancer Father   . Hypertension Father     Social History Social History  Substance Use Topics  . Smoking status: Never Smoker  . Smokeless tobacco: Never Used  . Alcohol use 0.0 oz/week     Comment: Drinks rarely wine     Allergies   Shrimp [shellfish allergy] and Codeine   Review of Systems Review of Systems  All other systems reviewed and are negative.    Physical Exam Updated Vital Signs BP 113/71   Pulse 76   Temp 98.2 F (36.8 C) (Oral)   Resp 20   SpO2 98%   Physical Exam  Constitutional: She is oriented to person, place, and time. She appears well-developed and well-nourished.  HENT:  Head: Normocephalic and atraumatic.  Mild swelling to the upper lip.  No intraoral swelling, no pooling of secretions, voice is normal.  Pharynx normal   Eyes: Conjunctivae are normal. Pupils are equal, round, and reactive to light. Right eye exhibits no discharge. Left eye exhibits no discharge. No scleral icterus.  Neck: Normal range of  motion. No JVD present. No tracheal deviation present.  Pulmonary/Chest: Effort normal and breath sounds normal. No stridor. No respiratory distress. She has no wheezes. She has no rales. She exhibits no tenderness.  Neurological: She is alert and oriented to person, place, and time. Coordination normal.  Skin:  Urticaria noted to the bilateral proximal lower extremities, lower back and chest  Psychiatric: She has a normal mood and affect. Her behavior is normal. Judgment and thought content normal.  Nursing note and vitals reviewed.    ED Treatments / Results  Labs (all labs ordered are listed, but only abnormal results are displayed) Labs Reviewed - No data to display  EKG  EKG Interpretation None  Radiology No results found.  Procedures Procedures (including critical care time)  Medications Ordered in ED Medications  diphenhydrAMINE (BENADRYL) capsule 50 mg (50 mg Oral Given 02/29/16 1546)  sodium chloride 0.9 % bolus 1,000 mL (1,000 mLs Intravenous New Bag/Given 02/29/16 1917)  predniSONE (DELTASONE) tablet 60 mg (60 mg Oral Given 02/29/16 1918)  diphenhydrAMINE (BENADRYL) injection 25 mg (25 mg Intravenous Given 02/29/16 1917)     Initial Impression / Assessment and Plan / ED Course  I have reviewed the triage vital signs and the nursing notes.  Pertinent labs & imaging results that were available during my care of the patient were reviewed by me and considered in my medical decision making (see chart for details).      Final Clinical Impressions(s) / ED Diagnoses   Final diagnoses:  Angioedema, initial encounter  Hives    Labs:   Imaging:  Consults:  Therapeutics: Prednisone, diphenhydramine, normal saline  Discharge Meds:   Assessment/Plan: Patient presents with allergic reaction.  This was likely due to the Pedialyte, although I discussed various other allergies including her blood pressure medication.  Patient has no signs of anaphylaxis here.   She will be given Benadryl, steroids, fluids and monitored here in the ED for symptomatic improvement.  Patient be reassessed by oncoming provider with likely disposition home.  I discussed the need for follow-up with primary care, follow-up with allergist, and strict return precautions.  Patient and her family verbalized her understanding and agreement to today's plan.    New Prescriptions New Prescriptions   DIPHENHYDRAMINE (BENADRYL) 25 MG CAPSULE    Take 1 capsule (25 mg total) by mouth every 6 (six) hours as needed.   PREDNISONE (STERAPRED UNI-PAK 21 TAB) 10 MG (21) TBPK TABLET    Take 1 tablet (10 mg total) by mouth daily. Take 6 tabs by mouth daily  for 1 days, then 5 tabs for 1 days, then 4 tabs for 1 days, then 3 tabs for 1 days, 2 tabs for 2 days, then 1 tab by mouth daily for 1 day     Okey Regal, PA-C 02/29/16 2021    Lacretia Leigh, MD 03/03/16 [phone]

## 2016-02-29 NOTE — ED Notes (Signed)
Mild redness/warmth noted to inside aspect of both wrists and palms of hands. Swelling noted to right side of pt's face and lower jaw. EDP aware.

## 2016-02-29 NOTE — Discharge Instructions (Signed)
Please follow-up with her primary care provider in 3 days for reassessment.  Please follow-up with allergist for allergy testing.  Return to the emergency room immediately if you experience any new or worsening signs or symptoms.  Please use medication as directed.

## 2016-03-05 ENCOUNTER — Ambulatory Visit: Payer: Medicaid Other | Attending: Specialist | Admitting: Physical Therapy

## 2016-03-05 DIAGNOSIS — M6281 Muscle weakness (generalized): Secondary | ICD-10-CM | POA: Insufficient documentation

## 2016-03-05 DIAGNOSIS — M25612 Stiffness of left shoulder, not elsewhere classified: Secondary | ICD-10-CM

## 2016-03-05 DIAGNOSIS — M25512 Pain in left shoulder: Secondary | ICD-10-CM | POA: Insufficient documentation

## 2016-03-05 NOTE — Therapy (Signed)
[facility] [place], [place], [address] Phone: [phone]   Fax:  [phone]  Physical Therapy Evaluation  Patient Details  Name: Sandra Church MRN: [id number] Date of Birth: January 28, 1960 Referring Provider: Dr Kathryne Hitch   Encounter Date: 03/05/2016      PT End of Session - 03/05/16 1122    Visit Number 1   Number of Visits 8   Date for PT Re-Evaluation 04/30/16   Authorization Type [place] medicaid    PT Start Time 1010   PT Stop Time 1108   PT Time Calculation (min) 58 min   Activity Tolerance Patient tolerated treatment well   Behavior During Therapy [facility] for tasks assessed/performed      Past Medical History:  Diagnosis Date  . Anemia   . Anxiety   . Arthritis   . Fibromyalgia   . GERD (gastroesophageal reflux disease)   . Hypertension   . Mitral valve problem   . Wears glasses     Past Surgical History:  Procedure Laterality Date  . CLOSED MANIPULATION KNEE WITH STERIOD INJECTION  2011   rt  . Cotton Osteotomy with Bone Graft Right 02/23/2014   Rt foot @ [facility]  . DIAGNOSTIC LAPAROSCOPY    . FOOT BONE EXCISION     both feet-spurs  . FOOT SURGERY     plantar fascitis  . JOINT REPLACEMENT  2011   rt total knee replacement  . KNEE ARTHROPLASTY  2012   rt  . OPEN REDUCTION INTERNAL FIXATION (ORIF) METACARPAL Left 07/06/2012   Procedure: LEFT HAND EXPLORATION WOUNDS, OPEN REDUCTION INTERNAL FIXATION SMALL METACARPAL FRACTURE;  Surgeon: Tennis Must, MD;  Location: [facility];  Service: Orthopedics;  Laterality: Left;  . REPAIR EXTENSOR TENDON Left 07/06/2012   Procedure: REPAIR EXTENSOR TENDON;  Surgeon: Tennis Must, MD;  Location: [facility];  Service: Orthopedics;  Laterality: Left;  Left   . SHOULDER ARTHROSCOPY WITH DISTAL CLAVICLE RESECTION Left 01/24/2016   Procedure: SHOULDER ARTHROSCOPY WITH DISTAL CLAVICLE RESECTION;  Surgeon: [name], MD;   Location: [facility];  Service: Orthopedics;  Laterality: Left;  . SHOULDER ARTHROSCOPY WITH ROTATOR CUFF REPAIR AND SUBACROMIAL DECOMPRESSION Left 01/24/2016   Procedure: LEFT SHOULDER ARTHROSCOPY ACROMIOPLASTY, CLAVICLECTOMY, ARTHROSCOPIC ROTATOR CUFF REPAIR;  Surgeon: [name], MD;  Location: [facility];  Service: Orthopedics;  Laterality: Left;  . SHOULDER SURGERY  2010   rt    There were no vitals filed for this visit.       Subjective Assessment - 03/05/16 1016    Subjective Patient had a massive left rotator cuff repair on 01/24/2016. She reports since that point her pain has been under control. She has been complaint with her sling use.  She is waking upo with the shoulder at times.    Pertinent History fibromyalgia, right rotator cuff 2010   Limitations House hold activities;Lifting   How long can you sit comfortably? No limit    How long can you stand comfortably? limited by baseline deficits    How long can you walk comfortably? limited by mobility deficits at baseline    Currently in Pain? Yes   Pain Score 6    Pain Location Shoulder   Pain Orientation Left   Pain Descriptors / Indicators Aching   Pain Onset More than a month ago   Pain Frequency Constant   Aggravating Factors  use of the arm (limited use) sleeping  Pain Relieving Factors pain medications; warm water, hot compresses    Effect of Pain on Daily Activities unable to use her left arm             [facility] PT Assessment - 03/05/16 0001      Assessment   Medical Diagnosis Left shoulder massive rotator cuff repair    Referring Provider Dr Kathryne Hitch    Onset Date/Surgical Date 01/24/16   Hand Dominance Right   Next MD Visit 03/07/2016   Prior Therapy none      Precautions   Precautions Shoulder   Type of Shoulder Precautions Per protocol    Required Braces or Orthoses Sling     Restrictions   Weight Bearing Restrictions No     Balance Screen   Has the  patient fallen in the past 6 months Yes   How many times? 1   Has the patient had a decrease in activity level because of a fear of falling?  No   Is the patient reluctant to leave their home because of a fear of falling?  No     Home Environment   Living Environment Private residence   Available Help at Discharge Family     Prior Function   Level of Independence Independent   Vocation On disability   Leisure fishing, hiking, shooting; gardening      Cognition   Overall Cognitive Status Within Functional Limits for tasks assessed   Attention Focused   Focused Attention Appears intact   Memory Appears intact   Awareness Appears intact   Problem Solving Appears intact     Observation/Other Assessments   Focus on Therapeutic Outcomes (FOTO)  FOTO out of order      Sensation   Additional Comments tingling in her fingers which she reports is coming from the fibromyalgia      Posture/Postural Control   Posture/Postural Control Postural limitations   Postural Limitations Rounded Shoulders;Forward head     ROM / Strength   AROM / PROM / Strength AROM;PROM;Strength     AROM   Overall AROM Comments Not taken at this time 2nd to rotator cuff surgery      PROM   PROM Assessment Site Shoulder   Right/Left Shoulder Right   Right Shoulder Flexion 122 Degrees   Right Shoulder ABduction 73 Degrees   Right Shoulder Internal Rotation 70 Degrees   Right Shoulder External Rotation 3 Degrees     Strength   Overall Strength Comments Not tested 2nd to surgery      Ambulation/Gait   Gait Comments flexd trunk                    [facility] Adult PT Treatment/Exercise - 03/05/16 0001      Shoulder Exercises: Supine   Other Supine Exercises supine wand ER 2x5 5 sec hold      Shoulder Exercises: Standing   Other Standing Exercises pendulum forward/ back side to side; circles x10; table slide                 PT Education - 03/05/16 1118    Education provided Yes    Education Details HEP, symptom management    Person(s) Educated Patient   Methods Explanation;Demonstration;Handout   Comprehension Verbalized understanding;Returned demonstration;Need further instruction          PT Short Term Goals - 03/05/16 1550      PT SHORT TERM GOAL #1   Title Patient will increase passive right shoulder  flexion by 25 degrees    Baseline 122 degrees baseline    Time 4   Period Weeks   Status New     PT SHORT TERM GOAL #2   Title Patient will increase right passive external rotation by 20 degrees    Baseline 3 degrees    Time 4   Period Weeks   Status New     PT SHORT TERM GOAL #3   Title Patient will be independent with basic HEP    Baseline No HEP    Time 4   Period Weeks   Status New     PT SHORT TERM GOAL #4   Title Patient will report 2/10 pain at worst    Baseline 7/10 pain at worst    Time 4   Period Weeks   Status New           PT Long Term Goals - 03/05/16 1555      PT LONG TERM GOAL #1   Title Patient will reach overhead to a cabinet to grab a 1lb item    Baseline can not use her left arm    Time 8   Period Weeks   Status New     PT LONG TERM GOAL #2   Title Patient will reach to t-8 without increased pain in order to perfrom hygene tasks    Baseline can not reach behind her back    Time 8   Period Weeks   Status New     PT LONG TERM GOAL #3   Title Patient will reach behind her head to do her haitr without pain.    Baseline Patient unable to reach behind her head    Time 8   Period Weeks   Status New               Plan - 03/05/16 1125    Clinical Impression Statement (P)  Patient is a 56 year old female S/P Massive left rotator cuff tear. She presents with expected limitations with all shoulder PROM. She has decreased functional use of her left shoulder.    Rehab Potential (P)  Fair   Clinical Impairments Affecting Rehab Potential (P)  limited amount of visits through medicaid    PT Frequency (P)  2x /  week   PT Duration (P)  8 weeks   PT Treatment/Interventions (P)  ADLs/Self Care Home Management;Cryotherapy;Electrical Stimulation;Iontophoresis 4mg /ml Dexamethasone;Moist Heat;Functional mobility training;Therapeutic activities;Therapeutic exercise;Passive range of motion;Taping;Vasopneumatic Device;Energy conservation;Manual techniques      Patient will benefit from skilled therapeutic intervention in order to improve the following deficits and impairments:     Visit Diagnosis: Stiffness of left shoulder, not elsewhere classified - Plan: PT plan of care cert/re-cert  Acute pain of left shoulder - Plan: PT plan of care cert/re-cert  Muscle weakness (generalized) - Plan: PT plan of care cert/re-cert     Problem List Patient Active Problem List   Diagnosis Date Noted  . Status post right foot surgery 04/12/2014  . 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 Pronation deformity of ankle, acquired 02/01/2014  . Tenosynovitis of right ankle 02/01/2014    Carney Living  PT DPT  03/05/2016, 4:13 PM  [facility] [address] [place], [place], [address] Phone: [phone]   Fax:  [phone]  Name: TAQUIA PIZZOLA MRN: [id number] Date of Birth: 01/13/1961

## 2016-03-13 ENCOUNTER — Encounter: Payer: Self-pay | Admitting: Physical Therapy

## 2016-03-13 ENCOUNTER — Ambulatory Visit: Payer: Medicaid Other | Attending: Specialist | Admitting: Physical Therapy

## 2016-03-13 DIAGNOSIS — M6281 Muscle weakness (generalized): Secondary | ICD-10-CM

## 2016-03-13 DIAGNOSIS — M25612 Stiffness of left shoulder, not elsewhere classified: Secondary | ICD-10-CM | POA: Diagnosis not present

## 2016-03-13 DIAGNOSIS — M25512 Pain in left shoulder: Secondary | ICD-10-CM | POA: Diagnosis present

## 2016-03-13 NOTE — Therapy (Signed)
[place], [place], [address] Phone: [phone]   Fax:  [phone]  Physical Therapy Treatment  Patient Details  Name: Sandra Church MRN: [id number] Date of Birth: October 28, 1960 Referring Provider: Dr Kathryne Hitch   Encounter Date: 03/13/2016      PT End of Session - 03/13/16 1411    Visit Number 2   Number of Visits 4   Date for PT Re-Evaluation 04/30/16   Authorization Type [place] medicaid    PT Start Time [id number]   PT Stop Time 1058   PT Time Calculation (min) 43 min   Activity Tolerance Patient tolerated treatment well   Behavior During Therapy [facility] for tasks assessed/performed      Past Medical History:  Diagnosis Date  . Anemia   . Anxiety   . Arthritis   . Fibromyalgia   . GERD (gastroesophageal reflux disease)   . Hypertension   . Mitral valve problem   . Wears glasses     Past Surgical History:  Procedure Laterality Date  . CLOSED MANIPULATION KNEE WITH STERIOD INJECTION  2011   rt  . Cotton Osteotomy with Bone Graft Right 02/23/2014   Rt foot @ [facility]  . DIAGNOSTIC LAPAROSCOPY    . FOOT BONE EXCISION     both feet-spurs  . FOOT SURGERY     plantar fascitis  . JOINT REPLACEMENT  2011   rt total knee replacement  . KNEE ARTHROPLASTY  2012   rt  . OPEN REDUCTION INTERNAL FIXATION (ORIF) METACARPAL Left 07/06/2012   Procedure: LEFT HAND EXPLORATION WOUNDS, OPEN REDUCTION INTERNAL FIXATION SMALL METACARPAL FRACTURE;  Surgeon: Tennis Must, MD;  Location: [place];  Service: Orthopedics;  Laterality: Left;  . REPAIR EXTENSOR TENDON Left 07/06/2012   Procedure: REPAIR EXTENSOR TENDON;  Surgeon: Tennis Must, MD;  Location: [place];  Service: Orthopedics;  Laterality: Left;  Left   . SHOULDER ARTHROSCOPY WITH DISTAL CLAVICLE RESECTION Left 01/24/2016   Procedure: SHOULDER ARTHROSCOPY WITH DISTAL CLAVICLE RESECTION;  Surgeon: [name], MD;   Location: [place];  Service: Orthopedics;  Laterality: Left;  . SHOULDER ARTHROSCOPY WITH ROTATOR CUFF REPAIR AND SUBACROMIAL DECOMPRESSION Left 01/24/2016   Procedure: LEFT SHOULDER ARTHROSCOPY ACROMIOPLASTY, CLAVICLECTOMY, ARTHROSCOPIC ROTATOR CUFF REPAIR;  Surgeon: [name], MD;  Location: [place];  Service: Orthopedics;  Laterality: Left;  . SHOULDER SURGERY  2010   rt    There were no vitals filed for this visit.      Subjective Assessment - 03/13/16 1045    Subjective Patient reports she has had some soreness but overall she feels like she is able to do her exercises without much difficulty. She does have some questions about her ER wand stretching.    Pertinent History fibromyalgia, right rotator cuff 2010   How long can you sit comfortably? No limit    How long can you stand comfortably? limited by baseline deficits    How long can you walk comfortably? limited by mobility deficits at baseline    Currently in Pain? Yes   Pain Score 8    Pain Location Shoulder   Pain Orientation Left   Pain Descriptors / Indicators Aching   Pain Type Surgical pain   Pain Radiating Towards down middle deltoid area    Pain Onset More than a month ago   Pain Frequency Constant   Aggravating Factors  use of the arm  Pain Relieving Factors pain and rest                          [facility] Adult PT Treatment/Exercise - 03/13/16 0001      Shoulder Exercises: Supine   Other Supine Exercises supine wand ER 2x5 5 sec hold; Supine wand flexion 2x10 with cuing not to push to the end range.       Shoulder Exercises: Standing   Other Standing Exercises Seated scapular retraction 2x10      Manual Therapy   Manual therapy comments Gentle PROM into all planes. Gentle Grade 1 and II AP and PA mobis to reduce inflammation.                 PT Education - 03/13/16 1047    Education provided Yes   Education Details reenforced symptom  mangement    Person(s) Educated Patient   Methods Explanation;Demonstration   Comprehension Verbalized understanding;Returned demonstration;Need further instruction          PT Short Term Goals - 03/13/16 1454      PT SHORT TERM GOAL #1   Title Patient will increase passive right shoulder flexion by 25 degrees    Baseline 122 degrees baseline    Time 4   Period Weeks   Status On-going     PT SHORT TERM GOAL #2   Title Patient will increase right passive external rotation by 20 degrees    Baseline 3 degrees    Time 4   Period Weeks   Status On-going     PT SHORT TERM GOAL #3   Title Patient will be independent with basic HEP    Baseline No HEP    Time 4   Period Weeks   Status On-going     PT SHORT TERM GOAL #4   Title Patient will report 2/10 pain at worst    Baseline 7/10 pain at worst    Time 4   Period Weeks   Status On-going           PT Long Term Goals - 03/05/16 1555      PT LONG TERM GOAL #1   Title Patient will reach overhead to a cabinet to grab a 1lb item    Baseline can not use her left arm    Time 8   Period Weeks   Status New     PT LONG TERM GOAL #2   Title Patient will reach to t-8 without increased pain in order to perfrom hygene tasks    Baseline can not reach behind her back    Time 8   Period Weeks   Status New     PT LONG TERM GOAL #3   Title Patient will reach behind her head to do her haitr without pain.    Baseline Patient unable to reach behind her head    Time 8   Period Weeks   Status New               Plan - 03/13/16 1507    Clinical Impression Statement Patient tolerated treatment well. She had improved ER to 16 degrees. Her flexion improved to 116 degrees passive. Therapy reviewed wand ER. The patient felt she wasnt doing it right but she was doing it perfectly. She was advised to lightly stretch. She is making good progress. She is out of the sling.    Rehab Potential Good   Clinical Impairments Affecting  Rehab Potential limited amount of visits through medicaid    PT Frequency 2x / week   PT Duration 8 weeks   PT Treatment/Interventions ADLs/Self Care Home Management;Cryotherapy;Electrical Stimulation;Iontophoresis 4mg /ml Dexamethasone;Moist Heat;Functional mobility training;Therapeutic activities;Therapeutic exercise;Passive range of motion;Taping;Vasopneumatic Device;Energy conservation;Manual techniques   PT Next Visit Plan continue to progress manual therapy per protocol; assess tolerance to ther-ex.    PT Home Exercise Plan wand ER wand IR, scapular retractions       Patient will benefit from skilled therapeutic intervention in order to improve the following deficits and impairments:     Visit Diagnosis: Stiffness of left shoulder, not elsewhere classified  Acute pain of left shoulder  Muscle weakness (generalized)     Problem List Patient Active Problem List   Diagnosis Date Noted  . Status post right foot surgery 04/12/2014  . 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 Pronation deformity of ankle, acquired 02/01/2014  . Tenosynovitis of right ankle 02/01/2014    Carney Living 03/13/2016, 3:16 PM  [facility] [address] [place], [place], [address] Phone: [phone]   Fax:  [phone]  Name: Sandra Church MRN: [id number] Date of Birth: 02-02-1960

## 2016-03-20 ENCOUNTER — Ambulatory Visit: Payer: Medicaid Other | Admitting: Physical Therapy

## 2016-03-20 DIAGNOSIS — M25612 Stiffness of left shoulder, not elsewhere classified: Secondary | ICD-10-CM | POA: Diagnosis not present

## 2016-03-20 DIAGNOSIS — M25512 Pain in left shoulder: Secondary | ICD-10-CM

## 2016-03-20 DIAGNOSIS — M6281 Muscle weakness (generalized): Secondary | ICD-10-CM

## 2016-03-20 NOTE — Therapy (Signed)
[place], [place], [address] Phone: [phone]   Fax:  [phone]  Physical Therapy Treatment  Patient Details  Name: Sandra Church MRN: [id number] Date of Birth: [birth year] Referring Provider: Dr Kathryne Hitch   Encounter Date: 03/20/2016      PT End of Session - 03/20/16 1203    Visit Number 3   Number of Visits 4   Date for PT Re-Evaluation 04/30/16   Authorization Type Adult medicaid    PT Start Time 1021   PT Stop Time 1110   PT Time Calculation (min) 49 min      Past Medical History:  Diagnosis Date  . Anemia   . Anxiety   . Arthritis   . Fibromyalgia   . GERD (gastroesophageal reflux disease)   . Hypertension   . Mitral valve problem   . Wears glasses     Past Surgical History:  Procedure Laterality Date  . CLOSED MANIPULATION KNEE WITH STERIOD INJECTION  2011   rt  . Cotton Osteotomy with Bone Graft Right 02/23/2014   Rt foot @ [facility]  . DIAGNOSTIC LAPAROSCOPY    . FOOT BONE EXCISION     both feet-spurs  . FOOT SURGERY     plantar fascitis  . JOINT REPLACEMENT  2011   rt total knee replacement  . KNEE ARTHROPLASTY  2012   rt  . OPEN REDUCTION INTERNAL FIXATION (ORIF) METACARPAL Left 07/06/2012   Procedure: LEFT HAND EXPLORATION WOUNDS, OPEN REDUCTION INTERNAL FIXATION SMALL METACARPAL FRACTURE;  Surgeon: Tennis Must, MD;  Location: [place];  Service: Orthopedics;  Laterality: Left;  . REPAIR EXTENSOR TENDON Left 07/06/2012   Procedure: REPAIR EXTENSOR TENDON;  Surgeon: Tennis Must, MD;  Location: [place];  Service: Orthopedics;  Laterality: Left;  Left   . SHOULDER ARTHROSCOPY WITH DISTAL CLAVICLE RESECTION Left 01/24/2016   Procedure: SHOULDER ARTHROSCOPY WITH DISTAL CLAVICLE RESECTION;  Surgeon: [name], MD;  Location: [place];  Service: Orthopedics;  Laterality: Left;  . SHOULDER ARTHROSCOPY WITH ROTATOR  CUFF REPAIR AND SUBACROMIAL DECOMPRESSION Left 01/24/2016   Procedure: LEFT SHOULDER ARTHROSCOPY ACROMIOPLASTY, CLAVICLECTOMY, ARTHROSCOPIC ROTATOR CUFF REPAIR;  Surgeon: [name], MD;  Location: [place];  Service: Orthopedics;  Laterality: Left;  . SHOULDER SURGERY  2010   rt    There were no vitals filed for this visit.      Subjective Assessment - 03/20/16 1028    Subjective I cannot reach the back of my head.    Currently in Pain? Yes   Pain Score 6    Pain Location Shoulder   Pain Orientation Left   Pain Descriptors / Indicators Sore   Aggravating Factors  use of arm    Pain Relieving Factors meds             [facility] PT Assessment - 03/20/16 0001      PROM   Right/Left Shoulder Left   Left Shoulder Flexion 122 Degrees   Left Shoulder ABduction 75 Degrees  shoulder hikes in supine   Left Shoulder External Rotation 20 Degrees  35 @ 45 deg abdct                     [facility] Adult PT Treatment/Exercise - 03/20/16 0001      Shoulder Exercises: Standing   External Rotation Left;10 reps;Theraband   Theraband Level (Shoulder External Rotation) Level 1 (Yellow)  Internal Rotation 10 reps   Theraband Level (Shoulder Internal Rotation) Level 1 (Yellow)   Retraction 20 reps;Theraband   Theraband Level (Shoulder Retraction) Level 1 (Yellow)     Modalities   Modalities Cryotherapy     Cryotherapy   Number Minutes Cryotherapy 10 Minutes   Cryotherapy Location Shoulder   Type of Cryotherapy Ice pack     Manual Therapy   Manual therapy comments Gentle PROM into all planes. Gentle Grade 1 and II AP and PA mobis to reduce inflammation.                   PT Short Term Goals - 03/13/16 1454      PT SHORT TERM GOAL #1   Title Patient will increase passive right shoulder flexion by 25 degrees    Baseline 122 degrees baseline    Time 4   Period Weeks   Status On-going     PT SHORT TERM GOAL #2   Title Patient will increase  right passive external rotation by 20 degrees    Baseline 3 degrees    Time 4   Period Weeks   Status On-going     PT SHORT TERM GOAL #3   Title Patient will be independent with basic HEP    Baseline No HEP    Time 4   Period Weeks   Status On-going     PT SHORT TERM GOAL #4   Title Patient will report 2/10 pain at worst    Baseline 7/10 pain at worst    Time 4   Period Weeks   Status On-going           PT Long Term Goals - 03/05/16 1555      PT LONG TERM GOAL #1   Title Patient will reach overhead to a cabinet to grab a 1lb item    Baseline can not use her left arm    Time 8   Period Weeks   Status New     PT LONG TERM GOAL #2   Title Patient will reach to t-8 without increased pain in order to perfrom hygene tasks    Baseline can not reach behind her back    Time 8   Period Weeks   Status New     PT LONG TERM GOAL #3   Title Patient will reach behind her head to do her haitr without pain.    Baseline Patient unable to reach behind her head    Time 8   Period Weeks   Status New               Plan - 03/20/16 1149    Clinical Impression Statement Pt deomstrates improving PROM. Began yellow band scap retraction and yellow band RTC strengthening. Very weak in ER do also given ER isometric. Asked pt to not allow pain to increase much with her exercises. She has not contacted [place] [facility] for PT prior to her covered visits.    PT Next Visit Plan continue to progress manual therapy per protocol; assess tolerance to ther-ex. finalize HEP and encourage [facility]    PT Home Exercise Plan wand ER wand IR, scapular retractions, yellow band retract, yelloe band IR/ER   Consulted and Agree with Plan of Care Patient      Patient will benefit from skilled therapeutic intervention in order to improve the following deficits and impairments:  Decreased range of motion, Decreased strength, Impaired UE functional use, Postural dysfunction  Visit  Diagnosis: Stiffness  of left shoulder, not elsewhere classified  Acute pain of left shoulder  Muscle weakness (generalized)     Problem List Patient Active Problem List   Diagnosis Date Noted  . Status post right foot surgery 04/12/2014  . 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 Pronation deformity of ankle, acquired 02/01/2014  . Tenosynovitis of right ankle 02/01/2014    Dorene Ar, [place] 03/20/2016, 12:11 PM  [place], [place], [address] Phone: [phone]   Fax:  [phone]  Name: Sandra Church MRN: [id number] Date of Birth: 1960-02-11

## 2016-03-20 NOTE — Patient Instructions (Addendum)
Strengthening: Resisted Internal Rotation  PLACE TOWEL UNDER ELBOW Hold tubing in left hand, elbow at side and forearm out. Rotate forearm in across body. Repeat __10__ times per set. Do __1-2__ sets per session. Do __2__ sessions per day.  http://orth.exer.us/830   Copyright  VHI. All rights reserved.  Strengthening: Resisted External Rotation  PLACE TOWEL UNDER ELBOW Hold tubing in right hand, elbow at side and forearm across body. Rotate forearm out. Repeat __10__ times per set. Do _1-2___ sets per session. Do __2__ sessions per day.   Resistive Band Rowing   With resistive band anchored in door, grasp both ends. Keeping elbows bent, pull back, squeezing shoulder blades together. Hold __5__ seconds. Repeat 10____ times. Do _2___ sessions per day.

## 2016-03-25 DIAGNOSIS — H538 Other visual disturbances: Secondary | ICD-10-CM | POA: Diagnosis not present

## 2016-03-25 DIAGNOSIS — Z5181 Encounter for therapeutic drug level monitoring: Secondary | ICD-10-CM | POA: Diagnosis not present

## 2016-03-25 DIAGNOSIS — E669 Obesity, unspecified: Secondary | ICD-10-CM | POA: Diagnosis not present

## 2016-03-25 DIAGNOSIS — Z131 Encounter for screening for diabetes mellitus: Secondary | ICD-10-CM | POA: Diagnosis not present

## 2016-03-25 DIAGNOSIS — Z136 Encounter for screening for cardiovascular disorders: Secondary | ICD-10-CM | POA: Diagnosis not present

## 2016-03-25 DIAGNOSIS — I10 Essential (primary) hypertension: Secondary | ICD-10-CM | POA: Diagnosis not present

## 2016-03-25 DIAGNOSIS — G894 Chronic pain syndrome: Secondary | ICD-10-CM | POA: Diagnosis not present

## 2016-03-25 DIAGNOSIS — Z113 Encounter for screening for infections with a predominantly sexual mode of transmission: Secondary | ICD-10-CM | POA: Diagnosis not present

## 2016-03-25 DIAGNOSIS — Z01118 Encounter for examination of ears and hearing with other abnormal findings: Secondary | ICD-10-CM | POA: Diagnosis not present

## 2016-03-27 ENCOUNTER — Ambulatory Visit: Payer: Medicaid Other | Admitting: Physical Therapy

## 2016-03-27 DIAGNOSIS — M25512 Pain in left shoulder: Secondary | ICD-10-CM

## 2016-03-27 DIAGNOSIS — M25612 Stiffness of left shoulder, not elsewhere classified: Secondary | ICD-10-CM | POA: Diagnosis not present

## 2016-03-27 DIAGNOSIS — M6281 Muscle weakness (generalized): Secondary | ICD-10-CM

## 2016-03-27 NOTE — Therapy (Addendum)
[place], [place], [address] Phone: [phone]   Fax:  [phone]  Physical Therapy Treatment  Patient Details  Name: Sandra Church MRN: [id number] Date of Birth: [birth year] Referring Provider: Dr Kathryne Hitch   Encounter Date: 03/27/2016      PT End of Session - 03/27/16 1023    Visit Number 4   Number of Visits 4   Date for PT Re-Evaluation 04/30/16   Authorization Type Adult medicaid    PT Start Time 1022   PT Stop Time 1117   PT Time Calculation (min) 55 min   Activity Tolerance Patient tolerated treatment well   Behavior During Therapy [facility] for tasks assessed/performed      Past Medical History:  Diagnosis Date  . Anemia   . Anxiety   . Arthritis   . Fibromyalgia   . GERD (gastroesophageal reflux disease)   . Hypertension   . Mitral valve problem   . Wears glasses     Past Surgical History:  Procedure Laterality Date  . CLOSED MANIPULATION KNEE WITH STERIOD INJECTION  2011   rt  . Cotton Osteotomy with Bone Graft Right 02/23/2014   Rt foot @ [facility]  . DIAGNOSTIC LAPAROSCOPY    . FOOT BONE EXCISION     both feet-spurs  . FOOT SURGERY     plantar fascitis  . JOINT REPLACEMENT  2011   rt total knee replacement  . KNEE ARTHROPLASTY  2012   rt  . OPEN REDUCTION INTERNAL FIXATION (ORIF) METACARPAL Left 07/06/2012   Procedure: LEFT HAND EXPLORATION WOUNDS, OPEN REDUCTION INTERNAL FIXATION SMALL METACARPAL FRACTURE;  Surgeon: Tennis Must, MD;  Location: [place];  Service: Orthopedics;  Laterality: Left;  . REPAIR EXTENSOR TENDON Left 07/06/2012   Procedure: REPAIR EXTENSOR TENDON;  Surgeon: Tennis Must, MD;  Location: [place];  Service: Orthopedics;  Laterality: Left;  Left   . SHOULDER ARTHROSCOPY WITH DISTAL CLAVICLE RESECTION Left 01/24/2016   Procedure: SHOULDER ARTHROSCOPY WITH DISTAL CLAVICLE RESECTION;  Surgeon: [name], MD;   Location: [place];  Service: Orthopedics;  Laterality: Left;  . SHOULDER ARTHROSCOPY WITH ROTATOR CUFF REPAIR AND SUBACROMIAL DECOMPRESSION Left 01/24/2016   Procedure: LEFT SHOULDER ARTHROSCOPY ACROMIOPLASTY, CLAVICLECTOMY, ARTHROSCOPIC ROTATOR CUFF REPAIR;  Surgeon: [name], MD;  Location: [place];  Service: Orthopedics;  Laterality: Left;  . SHOULDER SURGERY  2010   rt    There were no vitals filed for this visit.      Subjective Assessment - 03/27/16 1300    Subjective I am doing okay with the exercises. Still cannot reach.    Currently in Pain? Yes   Pain Score 4    Pain Location Shoulder   Pain Orientation Left   Pain Descriptors / Indicators Sore   Aggravating Factors  reaching, using arm   Pain Relieving Factors meds, heat, rest             [facility] PT Assessment - 03/27/16 0001      ROM / Strength   AROM / PROM / Strength AROM     AROM   AROM Assessment Site Shoulder   Right/Left Shoulder Left   Left Shoulder Flexion 60 Degrees  pain   Left Shoulder Internal Rotation --  reach to Left buttock   Left Shoulder External Rotation 25 Degrees     PROM   Left Shoulder Flexion 125 Degrees   Left Shoulder  ABduction 85 Degrees  shoulder hikes in supine   Left Shoulder External Rotation 30 Degrees  in 20  degrees  of abduction                     [facility] Adult PT Treatment/Exercise - 03/27/16 0001      Shoulder Exercises: Supine   Protraction 10 reps   External Rotation 10 reps  isometrics and yellow band off stomach, then 1# better   Other Supine Exercises supine wand ER 2x5 5 sec hold; Supine wand flexion 2x10 with cuing not to push to the end range.     Other Supine Exercises supine flexion clocks in comfortable ROM     Shoulder Exercises: Sidelying   External Rotation 20 reps;Weights   External Rotation Weight (lbs) 1     Shoulder Exercises: Standing   Internal Rotation 20 reps   Theraband Level  (Shoulder Internal Rotation) Level 1 (Yellow)   Flexion Limitations cane to 80 degrees   cues to keep pain minimal    Retraction 20 reps;Theraband   Theraband Level (Shoulder Retraction) Level 1 (Yellow)     Cryotherapy   Number Minutes Cryotherapy 10 Minutes   Cryotherapy Location Shoulder   Type of Cryotherapy Ice pack     Manual Therapy   Manual therapy comments Gentle PROM into all planes. Gentle Grade 1 and II AP and PA mobis to reduce inflammation.                 PT Education - 03/27/16 1139    Education provided Yes   Education Details HEP; reinforced symptom management, Keep pain minimal with HEP and functional tasks   Person(s) Educated Patient   Methods Explanation;Handout   Comprehension Verbalized understanding          PT Short Term Goals - 03/27/16 1132      PT SHORT TERM GOAL #1   Title Patient will increase passive right shoulder flexion by 25 degrees    Baseline 122 baseline. now 125   Time 4   Period Weeks   Status Not Met     PT SHORT TERM GOAL #2   Title Patient will increase right passive external rotation by 20 degrees    Baseline 3 degrees baseline, now 30 degrees    Time 4   Period Weeks   Status Achieved     PT SHORT TERM GOAL #3   Title Patient will be independent with basic HEP    Baseline Independent    Time 4   Period Weeks   Status Achieved     PT SHORT TERM GOAL #4   Title Patient will report 2/10 pain at worst    Baseline 6/10 pain at worst    Time 4   Period Weeks   Status Not Met           PT Long Term Goals - 03/27/16 1135      PT LONG TERM GOAL #1   Title Patient will reach overhead to a cabinet to grab a 1lb item    Baseline 60 degrees AROM flexion   Time 8   Period Weeks   Status Not Met     PT LONG TERM GOAL #2   Title Patient will reach to t-8 without increased pain in order to perfrom hygene tasks    Baseline reached to left buttock   Time 8   Period Weeks   Status Not Met     PT LONG  TERM  GOAL #3   Title Patient will reach behind her head to do her haitr without pain.    Baseline Patient unable to reach behind her head    Time 8   Period Weeks   Status Not Met               Plan - 03/27/16 1136    Clinical Impression Statement PROM improved. She has difficult with AROM/strength. Her shoulder AROM is 60 and causes increased pain.  Reviewed HEP and updated to include sidelying ER with 1# standing wand flexion AAROM. gentle IR reaching, flexion isometric. STG# 2,#3 Met. This was her last covered visit. She has contacted [place] [facility] and sees MD next week. Se will ask him to refer her to them for continued PT. She will continue her current HEP until then.    PT Next Visit Plan discharge to HEP; [place] [facility]    PT Home Exercise Plan wand ER wand IR, scapular retractions, yellow band retract, yelloe band IR/ER, ER sidelying AROM/1#, shoulder flexion isometric, AAROM wand flexion standing, reach behind back gently   Consulted and Agree with Plan of Care Patient      Patient will benefit from skilled therapeutic intervention in order to improve the following deficits and impairments:  Decreased range of motion, Decreased strength, Impaired UE functional use, Postural dysfunction  Visit Diagnosis: Stiffness of left shoulder, not elsewhere classified  Acute pain of left shoulder  Muscle weakness (generalized)    PHYSICAL THERAPY DISCHARGE SUMMARY  Visits from Start of Care: 4  Current functional level related to goals / functional outcomes: Patient had made good progress but per medicaid restrictions she was not able to do much active strengthening. She was given the number for the [facility].    Remaining deficits: Strength and motion deficits    Education / Equipment: HEP  Plan: Patient agrees to discharge.  Patient goals were not met. Patient is being discharged due to financial reasons.  ?????      Problem List Patient Active Problem List    Diagnosis Date Noted  . Status post right foot surgery 04/12/2014  . 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 Pronation deformity of ankle, acquired 02/01/2014  . Tenosynovitis of right ankle 02/01/2014    Carolyne Littles PT DPT  04/02/2016 5:03PM Dorene Ar , PTA 03/27/2016, 1:04 PM  [facility] [address] [place], [place], [address] Phone: [phone]   Fax:  [phone]  Name: DENECE SHEARER MRN: [id number] Date of Birth: Apr 20, 1960

## 2016-09-17 DIAGNOSIS — Z1231 Encounter for screening mammogram for malignant neoplasm of breast: Secondary | ICD-10-CM | POA: Diagnosis not present

## 2016-10-07 ENCOUNTER — Ambulatory Visit (INDEPENDENT_AMBULATORY_CARE_PROVIDER_SITE_OTHER): Payer: Medicaid Other | Admitting: Internal Medicine

## 2016-10-07 ENCOUNTER — Encounter (INDEPENDENT_AMBULATORY_CARE_PROVIDER_SITE_OTHER): Payer: Self-pay

## 2016-10-07 VITALS — BP 139/92 | HR 65 | Temp 98.1°F | Ht 66.0 in | Wt 224.1 lb

## 2016-10-07 DIAGNOSIS — M1712 Unilateral primary osteoarthritis, left knee: Secondary | ICD-10-CM | POA: Diagnosis not present

## 2016-10-07 DIAGNOSIS — Z9181 History of falling: Secondary | ICD-10-CM | POA: Diagnosis not present

## 2016-10-07 DIAGNOSIS — R5381 Other malaise: Secondary | ICD-10-CM

## 2016-10-07 DIAGNOSIS — Z823 Family history of stroke: Secondary | ICD-10-CM

## 2016-10-07 DIAGNOSIS — M797 Fibromyalgia: Secondary | ICD-10-CM

## 2016-10-07 DIAGNOSIS — K219 Gastro-esophageal reflux disease without esophagitis: Secondary | ICD-10-CM | POA: Diagnosis not present

## 2016-10-07 DIAGNOSIS — Z8739 Personal history of other diseases of the musculoskeletal system and connective tissue: Secondary | ICD-10-CM | POA: Diagnosis not present

## 2016-10-07 DIAGNOSIS — R0981 Nasal congestion: Secondary | ICD-10-CM

## 2016-10-07 DIAGNOSIS — Z809 Family history of malignant neoplasm, unspecified: Secondary | ICD-10-CM

## 2016-10-07 DIAGNOSIS — R5383 Other fatigue: Secondary | ICD-10-CM | POA: Diagnosis not present

## 2016-10-07 DIAGNOSIS — R002 Palpitations: Secondary | ICD-10-CM

## 2016-10-07 DIAGNOSIS — Z0000 Encounter for general adult medical examination without abnormal findings: Secondary | ICD-10-CM

## 2016-10-07 DIAGNOSIS — M159 Polyosteoarthritis, unspecified: Secondary | ICD-10-CM

## 2016-10-07 DIAGNOSIS — Z8379 Family history of other diseases of the digestive system: Secondary | ICD-10-CM | POA: Diagnosis not present

## 2016-10-07 DIAGNOSIS — I10 Essential (primary) hypertension: Secondary | ICD-10-CM | POA: Diagnosis not present

## 2016-10-07 DIAGNOSIS — Z13220 Encounter for screening for lipoid disorders: Secondary | ICD-10-CM | POA: Diagnosis present

## 2016-10-07 DIAGNOSIS — Z0189 Encounter for other specified special examinations: Secondary | ICD-10-CM

## 2016-10-07 DIAGNOSIS — R413 Other amnesia: Secondary | ICD-10-CM | POA: Diagnosis not present

## 2016-10-07 DIAGNOSIS — Z131 Encounter for screening for diabetes mellitus: Secondary | ICD-10-CM | POA: Diagnosis not present

## 2016-10-07 DIAGNOSIS — Z833 Family history of diabetes mellitus: Secondary | ICD-10-CM | POA: Diagnosis not present

## 2016-10-07 DIAGNOSIS — R2681 Unsteadiness on feet: Secondary | ICD-10-CM

## 2016-10-07 DIAGNOSIS — Z8249 Family history of ischemic heart disease and other diseases of the circulatory system: Secondary | ICD-10-CM | POA: Diagnosis not present

## 2016-10-07 DIAGNOSIS — Z96651 Presence of right artificial knee joint: Secondary | ICD-10-CM

## 2016-10-07 DIAGNOSIS — Z23 Encounter for immunization: Secondary | ICD-10-CM

## 2016-10-07 DIAGNOSIS — Z79899 Other long term (current) drug therapy: Secondary | ICD-10-CM | POA: Diagnosis not present

## 2016-10-07 DIAGNOSIS — D649 Anemia, unspecified: Secondary | ICD-10-CM

## 2016-10-07 DIAGNOSIS — Z780 Asymptomatic menopausal state: Secondary | ICD-10-CM

## 2016-10-07 LAB — GLUCOSE, CAPILLARY: GLUCOSE-CAPILLARY: 81 mg/dL (ref 65–99)

## 2016-10-07 LAB — POCT GLYCOSYLATED HEMOGLOBIN (HGB A1C): Hemoglobin A1C: 5.3

## 2016-10-07 MED ORDER — GABAPENTIN 100 MG PO CAPS: 300.0000 mg | ORAL_CAPSULE | Freq: Three times a day (TID) | ORAL | 2 refills | Status: DC

## 2016-10-07 NOTE — Progress Notes (Signed)
CC: hypertension, fibromyalgia  HPI:  Sandra Church is a 56 y.o. with past medical history documented as below presenting for establishment of care in regards to her hypertension, fibromyalgia, and GERD, and osteoarthritis. Today the patients greatest concern is the pain she experiences from her fibromyalgia. Per patient she was diagnosed with fibromyalgia in the 1990s and was recently on Lyrica, but the cost became too high. She describes her pain as a burning/tingling sensation diffusely over her body, but mostly localized to her shoulders, back, and arms. She says when it is at its worse the weight of her clothing hurts. She states her pain gets worse in the colder weather. She is currently on no medication for her fibromyalgia and uses heating pads to ease her pain.   The patient also has osteoarthritis and and has an extensive orthopedic surgery history as documented below. She takes BC powders 2-3 times daily for the pain. She uses a cane to ambulate, but does state she is unsteady on her feet and has frequent "slip ups" because her left knee gives out. Her most recent fall was in January 2018, which caused a rotator cuff tear that needed surgical intervention. Her pain is worse with activity and she occasionally will notice left knee swelling without redness or effusion.   Please see encounter based charting for in depth description of the patient's other chronic medical problems.  Past Medical History:  Diagnosis Date  . Anemia   . Anxiety   . Arthritis   . Fibromyalgia   . GERD (gastroesophageal reflux disease)   . Hypertension   . Mitral valve problem   . Wears glasses    Review of Systems:   Review of Systems  Constitutional: Positive for malaise/fatigue. Negative for chills and fever.  HENT: Positive for congestion.   Eyes: Negative for blurred vision and double vision.  Respiratory: Negative for shortness of breath.   Cardiovascular: Positive for palpitations.  Negative for chest pain.  Gastrointestinal: Negative for blood in stool, constipation, diarrhea, heartburn, melena, nausea and vomiting.  Genitourinary: Negative for dysuria and urgency.  Skin: Negative for rash.  Neurological: Negative for dizziness, focal weakness and headaches.  Psychiatric/Behavioral: Positive for memory loss (3-4 months progressively worsening).    Family History  Problem Relation Age of Onset  . Diabetes Mother   . Stroke Mother   . Cancer Father   . Hypertension Father    Social History  Substance Use Topics  . Smoking status: Never Smoker  . Smokeless tobacco: Never Used  . Alcohol use 0.0 oz/week     Comment: Drinks rarely wine   Past Surgical History:  Procedure Laterality Date  . CLOSED MANIPULATION KNEE WITH STERIOD INJECTION  2011   rt  . Cotton Osteotomy with Bone Graft Right 02/23/2014   Rt foot @ [facility]  . DIAGNOSTIC LAPAROSCOPY    . FOOT BONE EXCISION     both feet-spurs  . FOOT SURGERY     plantar fascitis  . JOINT REPLACEMENT  2011   rt total knee replacement  . KNEE ARTHROPLASTY  2012   rt  . OPEN REDUCTION INTERNAL FIXATION (ORIF) METACARPAL Left 07/06/2012   Procedure: LEFT HAND EXPLORATION WOUNDS, OPEN REDUCTION INTERNAL FIXATION SMALL METACARPAL FRACTURE;  Surgeon: Tennis Must, MD;  Location: [facility];  Service: Orthopedics;  Laterality: Left;  . REPAIR EXTENSOR TENDON Left 07/06/2012   Procedure: REPAIR EXTENSOR TENDON;  Surgeon: Tennis Must, MD;  Location: [place];  Service: Orthopedics;  Laterality: Left;  Left   . SHOULDER ARTHROSCOPY WITH DISTAL CLAVICLE RESECTION Left 01/24/2016   Procedure: SHOULDER ARTHROSCOPY WITH DISTAL CLAVICLE RESECTION;  Surgeon: [name], MD;  Location: [place];  Service: Orthopedics;  Laterality: Left;  . SHOULDER ARTHROSCOPY WITH ROTATOR CUFF REPAIR AND SUBACROMIAL DECOMPRESSION Left 01/24/2016   Procedure: LEFT SHOULDER ARTHROSCOPY  ACROMIOPLASTY, CLAVICLECTOMY, ARTHROSCOPIC ROTATOR CUFF REPAIR;  Surgeon: [name], MD;  Location: [place];  Service: Orthopedics;  Laterality: Left;  . SHOULDER SURGERY  2010   rt   Physical Exam:  Vitals:   10/07/16 0942  BP: (!) 150/93  Pulse: 77  Temp: 98.1 F (36.7 C)  TempSrc: Oral  SpO2: 100%  Weight: 224 lb 1.6 oz (101.7 kg)  Height: 5\' 6"  (1.676 m)   General: Sitting comfortably, NAD HEENT: [place]/AT, EOMI, no scleral icterus, PERRL Cardiac: RRR, No R/M/G appreciated Pulm: normal effort, CTAB Abd: soft, non tender, non distended, BS normal Ext: extremities well perfused, no peripheral edema Neuro: alert and oriented X3, cranial nerves II-XII grossly intact   Assessment & Plan:   See Encounters Tab for problem based charting.  Patient seen with Dr. Eppie Gibson

## 2016-10-07 NOTE — Patient Instructions (Signed)
Ms. Seelye,   It was a pleasure meeting you today.   I have started you on a new medication for your fibromyalgia called Gabapentin.   Take a total of 300 mg of Gabapentin at bedtime or three 100 mg tablets.   I will call you with the results of your lab work.  Return to clinic in 3 months for meet your new primary care physician.

## 2016-10-08 ENCOUNTER — Telehealth: Payer: Self-pay | Admitting: Internal Medicine

## 2016-10-08 DIAGNOSIS — K219 Gastro-esophageal reflux disease without esophagitis: Secondary | ICD-10-CM | POA: Insufficient documentation

## 2016-10-08 DIAGNOSIS — I10 Essential (primary) hypertension: Secondary | ICD-10-CM | POA: Insufficient documentation

## 2016-10-08 DIAGNOSIS — Z13220 Encounter for screening for lipoid disorders: Secondary | ICD-10-CM | POA: Insufficient documentation

## 2016-10-08 DIAGNOSIS — M797 Fibromyalgia: Secondary | ICD-10-CM | POA: Insufficient documentation

## 2016-10-08 DIAGNOSIS — Z0000 Encounter for general adult medical examination without abnormal findings: Secondary | ICD-10-CM | POA: Insufficient documentation

## 2016-10-08 DIAGNOSIS — D649 Anemia, unspecified: Secondary | ICD-10-CM | POA: Insufficient documentation

## 2016-10-08 DIAGNOSIS — M1990 Unspecified osteoarthritis, unspecified site: Secondary | ICD-10-CM | POA: Insufficient documentation

## 2016-10-08 DIAGNOSIS — Z131 Encounter for screening for diabetes mellitus: Secondary | ICD-10-CM | POA: Insufficient documentation

## 2016-10-08 LAB — CBC
HEMATOCRIT: 38.8 % (ref 34.0–46.6)
HEMOGLOBIN: 12.2 g/dL (ref 11.1–15.9)
MCH: 30.6 pg (ref 26.6–33.0)
MCHC: 31.4 g/dL — AB (ref 31.5–35.7)
MCV: 97 fL (ref 79–97)
Platelets: 431 10*3/uL — ABNORMAL HIGH (ref 150–379)
RBC: 3.99 x10E6/uL (ref 3.77–5.28)
RDW: 14.2 % (ref 12.3–15.4)
WBC: 10.7 10*3/uL (ref 3.4–10.8)

## 2016-10-08 LAB — LIPID PANEL
CHOL/HDL RATIO: 3.5 ratio (ref 0.0–4.4)
CHOLESTEROL TOTAL: 180 mg/dL (ref 100–199)
HDL: 51 mg/dL (ref >39)
LDL CALC: 104 mg/dL — AB (ref 0–99)
Triglycerides: 125 mg/dL (ref 0–149)
VLDL Cholesterol Cal: 25 mg/dL (ref 5–40)

## 2016-10-08 LAB — BMP8+ANION GAP
ANION GAP: 15 mmol/L (ref 10.0–18.0)
BUN/Creatinine Ratio: 19 (ref 9–23)
BUN: 13 mg/dL (ref 6–24)
CALCIUM: 10 mg/dL (ref 8.7–10.2)
CO2: 24 mmol/L (ref 20–29)
CREATININE: 0.7 mg/dL (ref 0.57–1.00)
Chloride: 103 mmol/L (ref 96–106)
GFR, EST AFRICAN AMERICAN: 111 mL/min/{1.73_m2} (ref >59)
GFR, EST NON AFRICAN AMERICAN: 96 mL/min/{1.73_m2} (ref >59)
Glucose: 87 mg/dL (ref 65–99)
Potassium: 4 mmol/L (ref 3.5–5.2)
Sodium: 142 mmol/L (ref 134–144)

## 2016-10-08 NOTE — Assessment & Plan Note (Signed)
Not well controlled and impacting the patient's quality of life. Previously on Lyrica, but was too expensive for the patient.   Plan:  -Gabapentin 300 mg at bedtime, increase as tolerated -If Gabapentin too expensive, consider amitriptyline

## 2016-10-08 NOTE — Telephone Encounter (Signed)
Called patient regarding lab results from clinic, but there was no answer. CBC, BMET, Lipid profile, and A1c were within normal limits. Left a message for the patient to call back to discuss results.

## 2016-10-08 NOTE — Assessment & Plan Note (Signed)
Lipid Panel     Component Value Date/Time   CHOL 180 10/07/2016 1121   TRIG 125 10/07/2016 1121   HDL 51 10/07/2016 1121   CHOLHDL 3.5 10/07/2016 1121   LDLCALC 104 (H) 10/07/2016 1121   Patient reported never having a lipid panel screening. Not on statin therapy. ASCVD 10 year risk score: 6.9%    Plan: -consider moderate intensity statin next visit

## 2016-10-08 NOTE — Assessment & Plan Note (Signed)
Patient self reported anemia in the past. CBC obtained at visit and was normal, Hbg 12.2.

## 2016-10-08 NOTE — Assessment & Plan Note (Signed)
Patient reports family history of type 2 diabetes and has not been screened in the past.  Hemoglobin A1C 5.3

## 2016-10-08 NOTE — Assessment & Plan Note (Addendum)
Patient reports her yearly mammogram was negative and Pap was negative.

## 2016-10-08 NOTE — Assessment & Plan Note (Signed)
Currently well asymptomatic and well controlled on Omeprazole 40 mg daily.

## 2016-10-08 NOTE — Assessment & Plan Note (Signed)
Patient has some relief with BC powders. Was offered prescription NSAID and declined because she wanted to see if the gabapentin helped her fibromyalgia pain, without the interference of the NSAID. She is interested in discussing prescription NSAIDs at next visit.   Plan: -Discuss interest in NSAID at next visit

## 2016-10-08 NOTE — Assessment & Plan Note (Addendum)
Blood Pressure 10/07/2016 02/29/2016 01/24/2016  BP 139/92 134/88 149/87  Mildly elevated on current regimen. Patient goal <130/80. Current medication regimen includes: Losartan 100 mg, Toprol 100 mg, and amlodipine 5 mg daily. Patient does not check her blood pressures at home but does have a BP cuff. Electrolytes and Creatinine (0.7) are within normal limits.   Plan: -Continue Losartan 100, Amlodipine 5, Toprol 100 -Suggested the patient take her blood pressure at home and keep a log and bring it in to her next appointment with her PCP -If elevated at home and next appointment consider increasing amlodipine to 10 mg -deferred changes today because patient was in pain and wanted to only address her fibromyalgia pain

## 2016-10-09 NOTE — Progress Notes (Signed)
I saw and evaluated the patient.  I personally confirmed the key portions of Dr. Revonda Standard history and exam and reviewed pertinent patient test results.  The assessment, diagnosis, and plan were formulated together and I agree with the documentation in the resident's note.

## 2016-10-23 ENCOUNTER — Ambulatory Visit (INDEPENDENT_AMBULATORY_CARE_PROVIDER_SITE_OTHER): Payer: Medicaid Other | Admitting: Internal Medicine

## 2016-10-23 DIAGNOSIS — R10A2 Flank pain, left side: Secondary | ICD-10-CM | POA: Insufficient documentation

## 2016-10-23 DIAGNOSIS — R109 Unspecified abdominal pain: Secondary | ICD-10-CM | POA: Diagnosis not present

## 2016-10-23 MED ORDER — SULFAMETHOXAZOLE-TRIMETHOPRIM 800-160 MG PO TABS: 1.0000 | ORAL_TABLET | Freq: Two times a day (BID) | ORAL | 0 refills | Status: AC

## 2016-10-23 NOTE — Patient Instructions (Addendum)
It was a pleasure to meet you Ms. Sandra Church.  You may have a urinary tract infection causing some of your symptoms.  We will treat you with an antibiotic called Bactrim twice a day for 3 days.  Please keep your appointment with Dr. Melba Coon for evaluation of your menstrual bleeding.  Please follow up with your PCP in 8-12 weeks or sooner if needed.

## 2016-10-23 NOTE — Progress Notes (Signed)
CC: Left flank pain with vaginal bleeding  HPI:  Sandra Church is a 56 y.o. female with PMH as listed below including HTN, Fibromyalgia, GERD, OA, Hx of Renal Stones, Fibroid Uterus, and hx of tubal ligation who presents for evaluation of left flank pain and vaginal bleeding.  Left flank pain with vaginal bleeding: Patient states she has not had any menstrual periods for 8 years. She began having dull left sided flank pain about 1-2 weeks ago. She felt like she had a "knot" in her left flank. She says she got up at night to use the restroom on 10/18/16 and noticed that she had vaginal bleeding. She says she checked and did not see any rectal bleeding, blood in the stool, melena, or hematuria. She says her pain from the "knot" eased off after this episode. She says her bleeding lasted for 2 days before resolving. She has noticed small clots and a foul odor. She had burning with urination this morning. She reports a history of renal stones which felt different than her current pain. She says she had bilateral tubal ligation about 34 years ago. She is sexually active. She has fibromyalgia pain which feels different than her current pain. She denies any fevers, chills, injury, heavy lifting, rash, or vaginal discharge.     Past Medical History:  Diagnosis Date  . Anemia   . Anxiety   . Arthritis   . Fibromyalgia   . GERD (gastroesophageal reflux disease)   . Hypertension   . Mitral valve problem   . Wears glasses    Review of Systems:   Review of Systems  Constitutional: Negative for chills and fever.  Respiratory: Negative for cough and shortness of breath.   Cardiovascular: Negative for chest pain and leg swelling.  Gastrointestinal: Negative for blood in stool, constipation, diarrhea, melena, nausea and vomiting.  Genitourinary: Positive for dysuria and flank pain. Negative for frequency, hematuria and urgency.       Vaginal bleeding  Musculoskeletal: Positive for joint pain  and myalgias. Negative for falls.  Skin: Negative for rash.  Neurological: Negative for dizziness, loss of consciousness and weakness.     Physical Exam:  Vitals:   10/23/16 0914  BP: 121/73  Pulse: 72  Temp: (!) 97.5 F (36.4 C)  TempSrc: Other (Comment)  SpO2: 100%  Weight: 222 lb 4.8 oz (100.8 kg)  Height: 5\' 6"  (1.676 m)   Physical Exam  Constitutional: She is oriented to person, place, and time. She appears well-developed and well-nourished. No distress.  HENT:  Head: Normocephalic and atraumatic.  Cardiovascular: Normal rate and regular rhythm.   No murmur heard. Pulmonary/Chest: Effort normal. No respiratory distress. She has no wheezes. She has no rales.  Abdominal: Soft. Bowel sounds are normal. She exhibits no distension.  Mild left sided CVA tenderness. Abdomen non-tender to palpation anteriorly.  Musculoskeletal: She exhibits no edema.  Neurological: She is alert and oriented to person, place, and time.  Skin: Skin is warm. She is not diaphoretic.    Assessment & Plan:   See Encounters Tab for problem based charting.  Patient discussed with Dr. Dareen Piano  Left flank pain Patient with 1-2 weeks of left sided flank pain and 2 days of vaginal bleeding with last menstrual period 8 years ago. Dipstick shows small leukocytes and trace blood. She may have a UTI contributing to her symptoms, however she does warrant further evaluation by OB/Gyn for her vaginal bleeding with further imaging or even endometrial biopsy. She is  not having fevers, chills, severe flank pain to suggest Pyelonephritis, abscess, tuboovarian torsion. She denies any heavy lifting or injury to suggest MSK pain and she is not tender over her ribs. - Will treat empirically for UTI with Bactrim-DS BID for 3 days - f/u Urine Cx - f/u with Ob/Gyn ,Dr. Melba Coon, for further evaluation

## 2016-10-23 NOTE — Assessment & Plan Note (Signed)
Patient with 1-2 weeks of left sided flank pain and 2 days of vaginal bleeding with last menstrual period 8 years ago. Dipstick shows small leukocytes and trace blood. She may have a UTI contributing to her symptoms, however she does warrant further evaluation by OB/Gyn for her vaginal bleeding with further imaging or even endometrial biopsy. She is not having fevers, chills, severe flank pain to suggest Pyelonephritis, abscess, tuboovarian torsion. She denies any heavy lifting or injury to suggest MSK pain and she is not tender over her ribs. - Will treat empirically for UTI with Bactrim-DS BID for 3 days - f/u Urine Cx - f/u with Ob/Gyn ,Dr. Melba Coon, for further evaluation

## 2016-10-24 LAB — URINALYSIS, ROUTINE W REFLEX MICROSCOPIC
Bilirubin, UA: NEGATIVE
Glucose, UA: NEGATIVE
Ketones, UA: NEGATIVE
Nitrite, UA: NEGATIVE
PH UA: 6.5 (ref 5.0–7.5)
PROTEIN UA: NEGATIVE
Specific Gravity, UA: 1.02 (ref 1.005–1.030)
UUROB: 0.2 mg/dL (ref 0.2–1.0)

## 2016-10-24 LAB — MICROSCOPIC EXAMINATION: CASTS: NONE SEEN /LPF

## 2016-10-24 NOTE — Progress Notes (Signed)
Internal Medicine Clinic Attending  Case discussed with Dr. Patel at the time of the visit.  We reviewed the resident's history and exam and pertinent patient test results.  I agree with the assessment, diagnosis, and plan of care documented in the resident's note.  

## 2016-10-25 LAB — URINE CULTURE

## 2016-12-12 ENCOUNTER — Other Ambulatory Visit: Payer: Self-pay

## 2016-12-12 ENCOUNTER — Encounter (HOSPITAL_BASED_OUTPATIENT_CLINIC_OR_DEPARTMENT_OTHER): Payer: Self-pay

## 2016-12-12 NOTE — Progress Notes (Signed)
Spoke with Aprel NPO after Midnight, including no gum, candy, or mints.  Arrival time 6:00AM.  EKG needed DOS. CBC and CMP lab appointment on 12/16/16 @9AM .  Amlodipine, Losartan, Metoprolol, and Omeprazole will be taken AM of surgery. Pre op orders are in epic.  Salvadore Oxford will accompany Sandra Church DOS and be her ride home.

## 2016-12-16 ENCOUNTER — Encounter (HOSPITAL_COMMUNITY)
Admission: RE | Admit: 2016-12-16 | Discharge: 2016-12-16 | Disposition: A | Discharge status: Home / Self Care | Payer: Medicaid Other | Source: Ambulatory Visit | Attending: Obstetrics and Gynecology | Admitting: Obstetrics and Gynecology

## 2016-12-16 DIAGNOSIS — I059 Rheumatic mitral valve disease, unspecified: Secondary | ICD-10-CM | POA: Diagnosis not present

## 2016-12-16 DIAGNOSIS — Z8249 Family history of ischemic heart disease and other diseases of the circulatory system: Secondary | ICD-10-CM | POA: Diagnosis not present

## 2016-12-16 DIAGNOSIS — I10 Essential (primary) hypertension: Secondary | ICD-10-CM | POA: Diagnosis not present

## 2016-12-16 DIAGNOSIS — Z01812 Encounter for preprocedural laboratory examination: Secondary | ICD-10-CM | POA: Diagnosis present

## 2016-12-16 DIAGNOSIS — N888 Other specified noninflammatory disorders of cervix uteri: Secondary | ICD-10-CM | POA: Diagnosis not present

## 2016-12-16 DIAGNOSIS — K219 Gastro-esophageal reflux disease without esophagitis: Secondary | ICD-10-CM | POA: Diagnosis not present

## 2016-12-16 DIAGNOSIS — Z91013 Allergy to seafood: Secondary | ICD-10-CM | POA: Diagnosis not present

## 2016-12-16 DIAGNOSIS — Z885 Allergy status to narcotic agent status: Secondary | ICD-10-CM | POA: Diagnosis not present

## 2016-12-16 DIAGNOSIS — F419 Anxiety disorder, unspecified: Secondary | ICD-10-CM | POA: Diagnosis not present

## 2016-12-16 DIAGNOSIS — Z823 Family history of stroke: Secondary | ICD-10-CM | POA: Diagnosis not present

## 2016-12-16 DIAGNOSIS — Z888 Allergy status to other drugs, medicaments and biological substances status: Secondary | ICD-10-CM | POA: Diagnosis not present

## 2016-12-16 DIAGNOSIS — M797 Fibromyalgia: Secondary | ICD-10-CM | POA: Diagnosis not present

## 2016-12-16 DIAGNOSIS — Z79899 Other long term (current) drug therapy: Secondary | ICD-10-CM | POA: Diagnosis not present

## 2016-12-16 DIAGNOSIS — D649 Anemia, unspecified: Secondary | ICD-10-CM | POA: Diagnosis not present

## 2016-12-16 DIAGNOSIS — Z96651 Presence of right artificial knee joint: Secondary | ICD-10-CM | POA: Diagnosis not present

## 2016-12-16 DIAGNOSIS — Z833 Family history of diabetes mellitus: Secondary | ICD-10-CM | POA: Diagnosis not present

## 2016-12-16 DIAGNOSIS — Z809 Family history of malignant neoplasm, unspecified: Secondary | ICD-10-CM | POA: Diagnosis not present

## 2016-12-16 DIAGNOSIS — M1990 Unspecified osteoarthritis, unspecified site: Secondary | ICD-10-CM | POA: Diagnosis not present

## 2016-12-16 DIAGNOSIS — N950 Postmenopausal bleeding: Secondary | ICD-10-CM | POA: Diagnosis not present

## 2016-12-16 LAB — COMPREHENSIVE METABOLIC PANEL
ALBUMIN: 3.9 g/dL (ref 3.5–5.0)
ALK PHOS: 93 U/L (ref 38–126)
ALT: 19 U/L (ref 14–54)
AST: 22 U/L (ref 15–41)
Anion gap: 7 (ref 5–15)
BILIRUBIN TOTAL: 0.6 mg/dL (ref 0.3–1.2)
BUN: 16 mg/dL (ref 6–20)
CHLORIDE: 106 mmol/L (ref 101–111)
CO2: 26 mmol/L (ref 22–32)
CREATININE: 0.69 mg/dL (ref 0.44–1.00)
Calcium: 9.4 mg/dL (ref 8.9–10.3)
GFR calc non Af Amer: >60 mL/min (ref >60)
Glucose, Bld: 100 mg/dL — ABNORMAL HIGH (ref 65–99)
POTASSIUM: 3.4 mmol/L — AB (ref 3.5–5.1)
SODIUM: 139 mmol/L (ref 135–145)
Total Protein: 7.4 g/dL (ref 6.5–8.1)

## 2016-12-16 LAB — CBC
HEMATOCRIT: 37.9 % (ref 36.0–46.0)
Hemoglobin: 12.5 g/dL (ref 12.0–15.0)
MCH: 31.4 pg (ref 26.0–34.0)
MCHC: 33 g/dL (ref 30.0–36.0)
MCV: 95.2 fL (ref 78.0–100.0)
Platelets: 410 10*3/uL — ABNORMAL HIGH (ref 150–400)
RBC: 3.98 MIL/uL (ref 3.87–5.11)
RDW: 14.2 % (ref 11.5–15.5)
WBC: 9.7 10*3/uL (ref 4.0–10.5)

## 2016-12-17 NOTE — H&P (Signed)
Sandra Church is an 56 y.o. female [phone] with PMB and polyp and fluid in endometrium for hysteroscopy, D&C, polypectomy.  D/W pt r/b/a of surgery including but not limited to bleeding, infection and damage to uterus. EMB reveals atrophic endometrim and yeast  Pertinent Gynecological History:  G2P2 SVD x2, no PIH or GDM BTL for contraception No abn pap, last 2014 HR HPV H/o Chl and RPR remotely  Episode of PMB  No LMP recorded. Patient is postmenopausal.    Past Medical History:  Diagnosis Date  . Anemia   . Anxiety   . Arthritis   . Fibromyalgia   . GERD (gastroesophageal reflux disease)   . Hypertension   . Jaundice    AGE 31 OR 21  . Mitral valve problem   . Wears glasses   . Wears partial dentures    UPPER    Past Surgical History:  Procedure Laterality Date  . CLOSED MANIPULATION KNEE WITH STERIOD INJECTION  2011   rt  . COLONOSCOPY    . Cotton Osteotomy with Bone Graft Right 02/23/2014   Rt foot @ [facility]  . DIAGNOSTIC LAPAROSCOPY    . FOOT BONE EXCISION     both feet-spurs  . FOOT SURGERY     plantar fascitis  . JOINT REPLACEMENT  2011   rt total knee replacement  . KNEE ARTHROPLASTY  2012   rt  . OPEN REDUCTION INTERNAL FIXATION (ORIF) METACARPAL Left 07/06/2012   Procedure: LEFT HAND EXPLORATION WOUNDS, OPEN REDUCTION INTERNAL FIXATION SMALL METACARPAL FRACTURE;  Surgeon: Tennis Must, MD;  Location: [place];  Service: Orthopedics;  Laterality: Left;  . REPAIR EXTENSOR TENDON Left 07/06/2012   Procedure: REPAIR EXTENSOR TENDON;  Surgeon: Tennis Must, MD;  Location: [place];  Service: Orthopedics;  Laterality: Left;  Left   . SHOULDER ARTHROSCOPY WITH DISTAL CLAVICLE RESECTION Left 01/24/2016   Procedure: SHOULDER ARTHROSCOPY WITH DISTAL CLAVICLE RESECTION;  Surgeon: [name], MD;  Location: [place];  Service: Orthopedics;  Laterality: Left;  . SHOULDER ARTHROSCOPY WITH ROTATOR CUFF REPAIR AND  SUBACROMIAL DECOMPRESSION Left 01/24/2016   Procedure: LEFT SHOULDER ARTHROSCOPY ACROMIOPLASTY, CLAVICLECTOMY, ARTHROSCOPIC ROTATOR CUFF REPAIR;  Surgeon: [name], MD;  Location: [place];  Service: Orthopedics;  Laterality: Left;  . SHOULDER SURGERY  2010   rt    Family History  Problem Relation Age of Onset  . Diabetes Mother   . Stroke Mother   . Cancer Father   . Hypertension Father     Social History:  reports that  has never smoked. she has never used smokeless tobacco. She reports that she does not drink alcohol or use drugs.  Allergies:  Allergies  Allergen Reactions  . Shrimp [Shellfish Allergy] Anaphylaxis, Shortness Of Breath and Swelling  . Codeine Itching    Only reacts at high doses  . Shea Butter     Co co butter    Meds: amlodipine, losartan, meloxicam, and metoprolol  Review of Systems  Constitutional:       Some achiness c/w fibromyalgai  HENT: Negative.   Eyes: Negative.   Respiratory: Negative.   Cardiovascular: Negative.   Gastrointestinal: Negative.   Genitourinary: Negative.   Musculoskeletal: Negative.   Skin: Negative.   Neurological: Negative.   Psychiatric/Behavioral: Negative.     Height 5' 6.5" (1.689 m), weight 100.2 kg (221 lb). Physical Exam  Constitutional: She is oriented to person, place, and time. She appears well-developed and  well-nourished.  HENT:  Head: Normocephalic and atraumatic.  Cardiovascular: Normal rate and regular rhythm.  Respiratory: Effort normal and breath sounds normal. No respiratory distress. She has no wheezes.  GI: Soft. Bowel sounds are normal. She exhibits no distension. There is no tenderness.  Musculoskeletal: Normal range of motion.  Neurological: She is alert and oriented to person, place, and time.  Skin: Skin is warm and dry.  Psychiatric: She has a normal mood and affect. Her behavior is normal.    EMB - atrophic endo, Sandra Church thin EMS, endometrial cavity with  fluid;poss polyp, sm intramural fibroids, nl ovaries  56yo G2P2 w PMB for hysteroscopy/D&C/polypectomy D/w pt r/b/a of surgery, will proceed   Sandra Church 12/17/2016, 8:53 PM

## 2016-12-18 ENCOUNTER — Ambulatory Visit (HOSPITAL_BASED_OUTPATIENT_CLINIC_OR_DEPARTMENT_OTHER)
Admission: RE | Admit: 2016-12-18 | Discharge: 2016-12-18 | Disposition: A | Discharge status: Home / Self Care | Payer: Medicaid Other | Source: Ambulatory Visit | Attending: Obstetrics and Gynecology | Admitting: Obstetrics and Gynecology

## 2016-12-18 ENCOUNTER — Encounter (HOSPITAL_BASED_OUTPATIENT_CLINIC_OR_DEPARTMENT_OTHER): Payer: Self-pay

## 2016-12-18 ENCOUNTER — Ambulatory Visit (HOSPITAL_BASED_OUTPATIENT_CLINIC_OR_DEPARTMENT_OTHER): Payer: Medicaid Other | Admitting: Anesthesiology

## 2016-12-18 ENCOUNTER — Other Ambulatory Visit: Payer: Self-pay

## 2016-12-18 ENCOUNTER — Encounter (HOSPITAL_BASED_OUTPATIENT_CLINIC_OR_DEPARTMENT_OTHER)
Admission: RE | Disposition: A | Discharge status: Home / Self Care | Payer: Self-pay | Source: Ambulatory Visit | Attending: Obstetrics and Gynecology

## 2016-12-18 DIAGNOSIS — N888 Other specified noninflammatory disorders of cervix uteri: Secondary | ICD-10-CM | POA: Insufficient documentation

## 2016-12-18 DIAGNOSIS — Z96651 Presence of right artificial knee joint: Secondary | ICD-10-CM | POA: Insufficient documentation

## 2016-12-18 DIAGNOSIS — Z809 Family history of malignant neoplasm, unspecified: Secondary | ICD-10-CM | POA: Insufficient documentation

## 2016-12-18 DIAGNOSIS — M797 Fibromyalgia: Secondary | ICD-10-CM | POA: Insufficient documentation

## 2016-12-18 DIAGNOSIS — D649 Anemia, unspecified: Secondary | ICD-10-CM | POA: Diagnosis not present

## 2016-12-18 DIAGNOSIS — Z79899 Other long term (current) drug therapy: Secondary | ICD-10-CM | POA: Insufficient documentation

## 2016-12-18 DIAGNOSIS — N858 Other specified noninflammatory disorders of uterus: Secondary | ICD-10-CM

## 2016-12-18 DIAGNOSIS — Z833 Family history of diabetes mellitus: Secondary | ICD-10-CM | POA: Insufficient documentation

## 2016-12-18 DIAGNOSIS — Z91013 Allergy to seafood: Secondary | ICD-10-CM | POA: Insufficient documentation

## 2016-12-18 DIAGNOSIS — F419 Anxiety disorder, unspecified: Secondary | ICD-10-CM | POA: Diagnosis not present

## 2016-12-18 DIAGNOSIS — I10 Essential (primary) hypertension: Secondary | ICD-10-CM | POA: Insufficient documentation

## 2016-12-18 DIAGNOSIS — Z823 Family history of stroke: Secondary | ICD-10-CM | POA: Insufficient documentation

## 2016-12-18 DIAGNOSIS — N950 Postmenopausal bleeding: Secondary | ICD-10-CM | POA: Insufficient documentation

## 2016-12-18 DIAGNOSIS — Z885 Allergy status to narcotic agent status: Secondary | ICD-10-CM | POA: Insufficient documentation

## 2016-12-18 DIAGNOSIS — I059 Rheumatic mitral valve disease, unspecified: Secondary | ICD-10-CM | POA: Insufficient documentation

## 2016-12-18 DIAGNOSIS — Z888 Allergy status to other drugs, medicaments and biological substances status: Secondary | ICD-10-CM | POA: Insufficient documentation

## 2016-12-18 DIAGNOSIS — Z8249 Family history of ischemic heart disease and other diseases of the circulatory system: Secondary | ICD-10-CM | POA: Insufficient documentation

## 2016-12-18 DIAGNOSIS — M1990 Unspecified osteoarthritis, unspecified site: Secondary | ICD-10-CM | POA: Insufficient documentation

## 2016-12-18 DIAGNOSIS — K219 Gastro-esophageal reflux disease without esophagitis: Secondary | ICD-10-CM | POA: Insufficient documentation

## 2016-12-18 HISTORY — DX: Other specified noninflammatory disorders of uterus: N85.8

## 2016-12-18 HISTORY — DX: Presence of dental prosthetic device (complete) (partial): Z97.2

## 2016-12-18 HISTORY — DX: Unspecified jaundice: R17

## 2016-12-18 HISTORY — PX: HYSTEROSCOPY W/D&C: SHX1775

## 2016-12-18 LAB — POCT I-STAT 4, (NA,K, GLUC, HGB,HCT)
GLUCOSE: 93 mg/dL (ref 65–99)
HCT: 38 % (ref 36.0–46.0)
HEMOGLOBIN: 12.9 g/dL (ref 12.0–15.0)
POTASSIUM: 3.6 mmol/L (ref 3.5–5.1)
SODIUM: 142 mmol/L (ref 135–145)

## 2016-12-18 SURGERY — DILATATION AND CURETTAGE /HYSTEROSCOPY
Anesthesia: General | Site: Vagina

## 2016-12-18 MED ORDER — FENTANYL CITRATE (PF) 100 MCG/2ML IJ SOLN
INTRAMUSCULAR | Status: AC
  Filled 2016-12-18: qty 2

## 2016-12-18 MED ORDER — MEPERIDINE HCL 25 MG/ML IJ SOLN
6.2500 mg | INTRAMUSCULAR | Status: DC | PRN
  Filled 2016-12-18: qty 1

## 2016-12-18 MED ORDER — PROPOFOL 10 MG/ML IV BOLUS
INTRAVENOUS | Status: AC
  Filled 2016-12-18: qty 40

## 2016-12-18 MED ORDER — LACTATED RINGERS IV SOLN
INTRAVENOUS | Status: DC
  Filled 2016-12-18: qty 1000

## 2016-12-18 MED ORDER — LACTATED RINGERS IV SOLN
INTRAVENOUS | Status: DC
  Administered 2016-12-18: 07:00:00 via INTRAVENOUS
  Filled 2016-12-18: qty 1000

## 2016-12-18 MED ORDER — LIDOCAINE 2% (20 MG/ML) 5 ML SYRINGE
INTRAMUSCULAR | Status: DC | PRN
  Administered 2016-12-18: 100 mg via INTRAVENOUS

## 2016-12-18 MED ORDER — DEXAMETHASONE SODIUM PHOSPHATE 10 MG/ML IJ SOLN
INTRAMUSCULAR | Status: AC
  Filled 2016-12-18: qty 1

## 2016-12-18 MED ORDER — KETOROLAC TROMETHAMINE 30 MG/ML IJ SOLN
INTRAMUSCULAR | Status: AC
  Filled 2016-12-18: qty 1

## 2016-12-18 MED ORDER — FENTANYL CITRATE (PF) 100 MCG/2ML IJ SOLN
INTRAMUSCULAR | Status: DC | PRN
  Administered 2016-12-18: 50 ug via INTRAVENOUS

## 2016-12-18 MED ORDER — METOCLOPRAMIDE HCL 5 MG/ML IJ SOLN
10.0000 mg | Freq: Once | INTRAMUSCULAR | Status: DC | PRN
  Filled 2016-12-18: qty 2

## 2016-12-18 MED ORDER — OXYCODONE-ACETAMINOPHEN 5-325 MG PO TABS: 1.0000 | ORAL_TABLET | Freq: Four times a day (QID) | ORAL | 0 refills | Status: DC | PRN

## 2016-12-18 MED ORDER — MIDAZOLAM HCL 5 MG/5ML IJ SOLN
INTRAMUSCULAR | Status: DC | PRN
  Administered 2016-12-18: 2 mg via INTRAVENOUS

## 2016-12-18 MED ORDER — ONDANSETRON HCL 4 MG/2ML IJ SOLN
INTRAMUSCULAR | Status: DC | PRN
  Administered 2016-12-18: 4 mg via INTRAVENOUS

## 2016-12-18 MED ORDER — LIDOCAINE 2% (20 MG/ML) 5 ML SYRINGE
INTRAMUSCULAR | Status: AC
  Filled 2016-12-18: qty 5

## 2016-12-18 MED ORDER — DEXAMETHASONE SODIUM PHOSPHATE 10 MG/ML IJ SOLN
INTRAMUSCULAR | Status: DC | PRN
  Administered 2016-12-18: 10 mg via INTRAVENOUS

## 2016-12-18 MED ORDER — KETOROLAC TROMETHAMINE 30 MG/ML IJ SOLN
INTRAMUSCULAR | Status: DC | PRN
  Administered 2016-12-18: 30 mg via INTRAVENOUS

## 2016-12-18 MED ORDER — ONDANSETRON HCL 4 MG/2ML IJ SOLN
INTRAMUSCULAR | Status: AC
  Filled 2016-12-18: qty 2

## 2016-12-18 MED ORDER — MIDAZOLAM HCL 2 MG/2ML IJ SOLN
INTRAMUSCULAR | Status: AC
  Filled 2016-12-18: qty 2

## 2016-12-18 MED ORDER — FENTANYL CITRATE (PF) 100 MCG/2ML IJ SOLN
25.0000 ug | INTRAMUSCULAR | Status: DC | PRN
Start: 2016-12-18 — End: 2016-12-18
  Filled 2016-12-18: qty 1

## 2016-12-18 MED ORDER — PROPOFOL 10 MG/ML IV BOLUS
INTRAVENOUS | Status: DC | PRN
  Administered 2016-12-18: 170 mg via INTRAVENOUS

## 2016-12-18 MED ORDER — LIDOCAINE HCL 1 % IJ SOLN
INTRAMUSCULAR | Status: DC | PRN
  Administered 2016-12-18: 10 mL

## 2016-12-18 MED ORDER — IBUPROFEN 200 MG PO TABS: 600.0000 mg | ORAL_TABLET | Freq: Four times a day (QID) | ORAL | 0 refills | Status: DC | PRN

## 2016-12-18 SURGICAL SUPPLY — 29 items
BIPOLAR CUTTING LOOP 21FR (ELECTRODE)
CANISTER SUCT 3000ML PPV (MISCELLANEOUS) ×3 IMPLANT
CATH ROBINSON RED A/P 16FR (CATHETERS) ×3 IMPLANT
COVER BACK TABLE 60X90IN (DRAPES) IMPLANT
DILATOR CANAL MILEX (MISCELLANEOUS) IMPLANT
DRAPE HYSTEROSCOPY (DRAPE) IMPLANT
DRAPE LG THREE QUARTER DISP (DRAPES) IMPLANT
DRSG TELFA 3X8 NADH (GAUZE/BANDAGES/DRESSINGS) IMPLANT
ELECT REM PT RETURN 9FT ADLT (ELECTROSURGICAL)
ELECTRODE REM PT RTRN 9FT ADLT (ELECTROSURGICAL) IMPLANT
GLOVE BIO SURGEON STRL SZ 6.5 (GLOVE) ×2 IMPLANT
GLOVE BIO SURGEONS STRL SZ 6.5 (GLOVE) ×1
GLOVE BIOGEL PI IND STRL 7.0 (GLOVE) ×1 IMPLANT
GLOVE BIOGEL PI INDICATOR 7.0 (GLOVE) ×2
GOWN STRL REUS W/ TWL LRG LVL3 (GOWN DISPOSABLE) ×1 IMPLANT
GOWN STRL REUS W/TWL LRG LVL3 (GOWN DISPOSABLE) ×3
KIT RM TURNOVER CYSTO AR (KITS) ×3 IMPLANT
LEGGING LITHOTOMY PAIR STRL (DRAPES) IMPLANT
LOOP CUTTING BIPOLAR 21FR (ELECTRODE) IMPLANT
PACK BASIN DAY SURGERY FS (CUSTOM PROCEDURE TRAY) ×3 IMPLANT
PACK MINOR VAGINAL W LONG (CUSTOM PROCEDURE TRAY) ×3 IMPLANT
PAD OB MATERNITY 4.3X12.25 (PERSONAL CARE ITEMS) ×3 IMPLANT
PAD PREP 24X48 CUFFED NSTRL (MISCELLANEOUS) ×3 IMPLANT
SYR 20CC LL (SYRINGE) IMPLANT
TOWEL OR 17X24 6PK STRL BLUE (TOWEL DISPOSABLE) ×6 IMPLANT
TRAY DSU PREP LF (CUSTOM PROCEDURE TRAY) ×3 IMPLANT
TUBING AQUILEX INFLOW (TUBING) ×3 IMPLANT
TUBING AQUILEX OUTFLOW (TUBING) ×3 IMPLANT
WATER STERILE IRR 500ML POUR (IV SOLUTION) ×3 IMPLANT

## 2016-12-18 NOTE — Anesthesia Preprocedure Evaluation (Signed)
Anesthesia Evaluation  Patient identified by MRN, date of birth, ID band Patient awake    Reviewed: Allergy & Precautions, NPO status , Patient's Chart, lab work & pertinent test results  Airway Mallampati: II  TM Distance: >3 FB Neck ROM: Full    Dental no notable dental hx.    Pulmonary neg pulmonary ROS,    Pulmonary exam normal breath sounds clear to auscultation       Cardiovascular hypertension, Pt. on medications Normal cardiovascular exam Rhythm:Regular Rate:Normal     Neuro/Psych negative neurological ROS  negative psych ROS   GI/Hepatic Neg liver ROS, GERD  ,  Endo/Other  negative endocrine ROS  Renal/GU negative Renal ROS  negative genitourinary   Musculoskeletal  (+) Fibromyalgia -  Abdominal   Peds negative pediatric ROS (+)  Hematology negative hematology ROS (+)   Anesthesia Other Findings   Reproductive/Obstetrics negative OB ROS                            Anesthesia Physical Anesthesia Plan  ASA: II  Anesthesia Plan: General   Post-op Pain Management:    Induction: Intravenous  PONV Risk Score and Plan: 3 and Ondansetron, Dexamethasone and Midazolam  Airway Management Planned: LMA  Additional Equipment:   Intra-op Plan:   Post-operative Plan: Extubation in OR  Informed Consent: I have reviewed the patients History and Physical, chart, labs and discussed the procedure including the risks, benefits and alternatives for the proposed anesthesia with the patient or authorized representative who has indicated his/her understanding and acceptance.   Dental advisory given  Plan Discussed with: CRNA  Anesthesia Plan Comments:         Anesthesia Quick Evaluation

## 2016-12-18 NOTE — Brief Op Note (Signed)
12/18/2016  8:24 AM  PATIENT:  Sandra Church  56 y.o. female  PRE-OPERATIVE DIAGNOSIS:  Postmenopausal bleeding, Pain in pelvis  POST-OPERATIVE DIAGNOSIS:  Postmenopausal bleeding, Pain in pelvis  PROCEDURE:  Procedure(s): DILATATION AND CURETTAGE /HYSTEROSCOPY (N/A)  SURGEON:  Surgeon(s) and Role:    * Bovard-Stuckert, Nusayba Cadenas, MD - Primary  ANESTHESIA:   local and general by LMA  EBL:  5 mL   BLOOD ADMINISTERED:none  DRAINS: none   LOCAL MEDICATIONS USED:  LIDOCAINE   SPECIMEN:  Source of Specimen:  endometrial currettings  DISPOSITION OF SPECIMEN:  PATHOLOGY  COUNTS:  YES  TOURNIQUET:  * No tourniquets in log *  DICTATION: .Other Dictation: Dictation Number [id number]  PLAN OF CARE: Discharge to home after PACU  PATIENT DISPOSITION:  PACU - hemodynamically stable.   Delay start of Pharmacological VTE agent (>24hrs) due to surgical blood loss or risk of bleeding: not applicable

## 2016-12-18 NOTE — Anesthesia Postprocedure Evaluation (Signed)
Anesthesia Post Note  Patient: [name]  Procedure(s) Performed: DILATATION AND CURETTAGE /HYSTEROSCOPY (N/A Vagina )     Patient location during evaluation: PACU Anesthesia Type: General Level of consciousness: awake and alert Pain management: pain level controlled Vital Signs Assessment: post-procedure vital signs reviewed and stable Respiratory status: spontaneous breathing, nonlabored ventilation, respiratory function stable and patient connected to nasal cannula oxygen Cardiovascular status: blood pressure returned to baseline and stable Postop Assessment: no apparent nausea or vomiting Anesthetic complications: no    Last Vitals:  Vitals:   12/18/16 0930 12/18/16 1026  BP: 136/76 124/75  Pulse: 62 71  Resp: 12 16  Temp:  36.4 C  SpO2: 95% 97%    Last Pain:  Vitals:   12/18/16 1026  TempSrc: Oral  PainSc:                  Montez Hageman

## 2016-12-18 NOTE — Transfer of Care (Signed)
Immediate Anesthesia Transfer of Care Note  Patient: Sandra Church  Procedure(s) Performed: DILATATION AND CURETTAGE /HYSTEROSCOPY (N/A Vagina )  Patient Location: PACU  Anesthesia Type:General  Level of Consciousness: awake, alert  and oriented  Airway & Oxygen Therapy: Patient Spontanous Breathing and Patient connected to nasal cannula oxygen  Post-op Assessment: Report given to RN  Post vital signs: Reviewed and stable  Last Vitals:133/91,  78, 15, 97% Vitals:   12/18/16 0547 12/18/16 0819  BP: 133/87   Pulse: 92   Resp: 18   Temp: 36.4 C 36.6 C  SpO2: 100%     Last Pain:  Vitals:   12/18/16 0625  TempSrc:   PainSc: 6       Patients Stated Pain Goal: 5 (83/33/83 2919)  Complications: No apparent anesthesia complications

## 2016-12-18 NOTE — Interval H&P Note (Signed)
History and Physical Interval Note:  12/18/2016 7:35 AM  Sandra Church  has presented today for surgery, with the diagnosis of Postmenopausal bleeding, Pain in pelvis  The various methods of treatment have been discussed with the patient and family. After consideration of risks, benefits and other options for treatment, the patient has consented to  Procedure(s): DILATATION AND CURETTAGE /HYSTEROSCOPY (N/A) as a surgical intervention .  The patient's history has been reviewed, patient examined, no change in status, stable for surgery.  I have reviewed the patient's chart and labs.  Questions were answered to the patient's satisfaction.     Chaz Ronning Bovard-Stuckert

## 2016-12-18 NOTE — Anesthesia Procedure Notes (Signed)
Procedure Name: LMA Insertion Date/Time: 12/18/2016 7:44 AM Performed by: Bonney Aid, CRNA Pre-anesthesia Checklist: Patient identified, Emergency Drugs available, Suction available and Patient being monitored Patient Re-evaluated:Patient Re-evaluated prior to induction Oxygen Delivery Method: Circle system utilized Preoxygenation: Pre-oxygenation with 100% oxygen Induction Type: IV induction Ventilation: Mask ventilation without difficulty LMA: LMA inserted LMA Size: 4.0 Number of attempts: 1 Airway Equipment and Method: Bite block Placement Confirmation: positive ETCO2 Tube secured with: Tape Dental Injury: Teeth and Oropharynx as per pre-operative assessment

## 2016-12-18 NOTE — Discharge Instructions (Signed)
DISCHARGE INSTRUCTIONS: HYSTEROSCOPY  The following instructions have been prepared to help you care for yourself upon your return home.  May take stool softner while taking narcotic pain medication to prevent constipation.  Drink plenty of water.  Personal hygiene:  Use sanitary pads for vaginal drainage, not tampons.  Shower the day after your procedure.  NO tub baths, pools or Jacuzzis for 2-3 weeks.  Wipe front to back after using the bathroom.  Activity and limitations:  Do NOT drive or operate any equipment for 24 hours. The effects of anesthesia are still present and drowsiness may result.  Do NOT rest in bed all day.  Walking is encouraged.  Walk up and down stairs slowly.  You may resume your normal activity in one to two days or as indicated by your physician. Sexual activity: NO intercourse for at least 2 weeks after the procedure, or as indicated by your Doctor.  Diet: Eat a light meal as desired this evening. You may resume your usual diet tomorrow.  Return to Work: You may resume your work activities in one to two days or as indicated by Marine scientist.  What to expect after your surgery: Expect to have vaginal bleeding/discharge for 2-3 days and spotting for up to 10 days. It is not unusual to have soreness for up to 1-2 weeks. You may have a slight burning sensation when you urinate for the first day. Mild cramps may continue for a couple of days. You may have a regular period in 2-6 weeks.  Call your doctor for any of the following:  Excessive vaginal bleeding or clotting, saturating and changing one pad every hour.  Inability to urinate 6 hours after discharge from hospital.  Pain not relieved by pain medication.  Fever of 100.4 F or greater.  Unusual vaginal discharge or odor.   Post Anesthesia Home Care Instructions  Activity: Get plenty of rest for the remainder of the day. A responsible individual must stay with you for 24 hours following  the procedure.  For the next 24 hours, DO NOT: -Drive a car -Paediatric nurse -Drink alcoholic beverages -Take any medication unless instructed by your physician -Make any legal decisions or sign important papers.  Meals: Start with liquid foods such as gelatin or soup. Progress to regular foods as tolerated. Avoid greasy, spicy, heavy foods. If nausea and/or vomiting occur, drink only clear liquids until the nausea and/or vomiting subsides. Call your physician if vomiting continues.  Special Instructions/Symptoms: Your throat may feel dry or sore from the anesthesia or the breathing tube placed in your throat during surgery. If this causes discomfort, gargle with warm salt water. The discomfort should disappear within 24 hours.  If you had a scopolamine patch placed behind your ear for the management of post- operative nausea and/or vomiting:  1. The medication in the patch is effective for 72 hours, after which it should be removed.  Wrap patch in a tissue and discard in the trash. Wash hands thoroughly with soap and water. 2. You may remove the patch earlier than 72 hours if you experience unpleasant side effects which may include dry mouth, dizziness or visual disturbances. 3. Avoid touching the patch. Wash your hands with soap and water after contact with the patch.

## 2016-12-19 ENCOUNTER — Encounter (HOSPITAL_BASED_OUTPATIENT_CLINIC_OR_DEPARTMENT_OTHER): Payer: Self-pay | Admitting: Obstetrics and Gynecology

## 2016-12-19 NOTE — Op Note (Signed)
NAMEVIRTIE, BUNGERT NO.:  [id number]  MEDICAL RECORD NO.:  [id number]  LOCATION:                                 FACILITY:  PHYSICIAN:  Thornell Sartorius, MD             DATE OF BIRTH:  DATE OF PROCEDURE:  12/18/2016 DATE OF DISCHARGE:                              OPERATIVE REPORT   PREOPERATIVE DIAGNOSIS:  Postmenopausal bleeding.  POSTOPERATIVE DIAGNOSES:  Postmenopausal bleeding, atrophic endometrium.  PROCEDURES:  Operative hysteroscopy, dilation and curettage.  SURGEON:  Thornell Sartorius, MD  ANESTHESIA:  10 mL of 1% lidocaine for paracervical block and general anesthesia by LMA.  ESTIMATED BLOOD LOSS:  Approximately 5 mL.  INTRAVENOUS FLUIDS:  Per anesthesia records.  URINE OUTPUT:  Per anesthesia records.  COMPLICATIONS:  None.  PATHOLOGY:  Endometrial curettings to Pathology.  DESCRIPTION OF PROCEDURE:  After informed consent was reviewed with the patient including risks, benefits, and alternatives, she was transferred to the OR, placed on the table in a supine position, and placed in the [place] stirrups.  Anesthesia was induced and found to be adequate. She was then prepped and draped in the normal sterile fashion.  After an appropriate time-out had been performed, an open-sided speculum was used to easily visualize her cervix.  A paracervical block was placed with 10 mL of 1% lidocaine.  The anterior lip of her cervix was grasped with a single-toothed tenaculum, and her uterus was sounded to approximately 7 cm.  Using the dilators, we dilated her cervix for approximately 91- [place].  Hysteroscope was introduced, and a brief survey of the uterus revealed a small endometrial cavity and both ostia were easily visualized.  Atrophic lining was seen.  There was no polyp appreciated. The D and C was performed with a sharp curette, it was gritty in all 4 quadrants. A second-look hysteroscopy revealed after blood was cleared from the cavity, no changes  to the cavity other than the D and C had been performed.  The camera was removed from her uterus. The tenaculum was removed from her cervix.  The sites were found to be hemostatic.  The speculum was removed.  The curettings were sent to Pathology.  She was returned to the supine position, awakened in stable condition, transferred to the PACU.     Thornell Sartorius, MD     JB/MEDQ  D:  12/18/2016  T:  12/18/2016  Job:  [id number]

## 2016-12-31 DIAGNOSIS — N950 Postmenopausal bleeding: Secondary | ICD-10-CM

## 2016-12-31 HISTORY — DX: Postmenopausal bleeding: N95.0

## 2017-01-09 ENCOUNTER — Ambulatory Visit (INDEPENDENT_AMBULATORY_CARE_PROVIDER_SITE_OTHER): Payer: Medicaid Other | Admitting: Internal Medicine

## 2017-01-09 ENCOUNTER — Other Ambulatory Visit: Payer: Self-pay

## 2017-01-09 VITALS — BP 118/91 | HR 77 | Temp 97.5°F | Ht 66.0 in | Wt 223.0 lb

## 2017-01-09 DIAGNOSIS — G5701 Lesion of sciatic nerve, right lower limb: Secondary | ICD-10-CM

## 2017-01-09 DIAGNOSIS — Z791 Long term (current) use of non-steroidal anti-inflammatories (NSAID): Secondary | ICD-10-CM

## 2017-01-09 DIAGNOSIS — Z8742 Personal history of other diseases of the female genital tract: Secondary | ICD-10-CM | POA: Diagnosis not present

## 2017-01-09 HISTORY — DX: Lesion of sciatic nerve, right lower limb: G57.01

## 2017-01-09 MED ORDER — NAPROXEN 500 MG PO TABS: 500.0000 mg | ORAL_TABLET | Freq: Two times a day (BID) | ORAL | 0 refills | Status: DC

## 2017-01-09 NOTE — Patient Instructions (Signed)
FOLLOW-UP INSTRUCTIONS When: in about 4 weeks if needed For: back pain What to bring: current medications  I have prescribed a new anti-inflammatory pain medication for you.  Take this twice per day along with scheduled heat and ice as often as tolerated.  I have also referred you to physical therapy

## 2017-01-09 NOTE — Progress Notes (Signed)
   CC: here for back pain  HPI:  [name] is a 56 y.o. woman with a past medical history listed below here today for follow up of her back pain.  For details of today's visit and the status of her chronic medical issues please refer to the assessment and plan.   Past Medical History:  Diagnosis Date  . Anemia   . Anxiety   . Arthritis   . Atrophic endometrium 12/18/2016  . Fibromyalgia   . GERD (gastroesophageal reflux disease)   . Hypertension   . Jaundice    AGE 70 OR 21  . Mitral valve problem   . Wears glasses   . Wears partial dentures    UPPER   Review of Systems:  Please see pertinent ROS reviewed in HPI and problem based charting.   Physical Exam:  Vitals:   01/09/17 1105  BP: (!) 118/91  Pulse: 77  Temp: (!) 97.5 F (36.4 C)  TempSrc: Oral  SpO2: 99%  Weight: 223 lb (101.2 kg)  Height: 5\' 6"  (1.676 m)   Physical Exam  Constitutional: She is oriented to person, place, and time.  No acute distress.  Standing hunched over exam table upon entering the room.  Ambulating with a cane.  HENT:  Head: Normocephalic and atraumatic.  Pulmonary/Chest: Effort normal.  Musculoskeletal: She exhibits tenderness. She exhibits no edema or deformity.  She has spasm of her lumbar paraspinal muscle and tenderness of her piriformis on the right. Straight leg raise reproduces symptoms. Normal strength on the left Decreased strength with plantar flexion on the right.  Difficult to ascertain if due more to effort and pain vs true weakness.  Neurological: She is alert and oriented to person, place, and time.  Skin: Skin is warm and dry.     Assessment & Plan:   See Encounters Tab for problem based charting.  Patient discussed with Dr. Rebeca Alert.  Piriformis syndrome of right side HPI: Patient reports acute onset of symptoms around Dec 13 (3 days post-op for uterine fibroids).  She reports symptoms onset was when she was sitting at rest on her couch.  Denies  trauma or injury to the area.  No rashes or skin lesions.  Symptoms aggravated by sitting too low on her couch.  She has attempted pain control with ibuprfen 400mg  about 3 times per day, ice and heat, and self massage.  All this has provided mild relief.  She has some decreased strength with plantar flexion on exam but it is difficult to determine if due more so to pain.  She has an MRI from 2002 with moderately small foraminal herniated nucleus pulposus on the left at L2-3, moderately small foraminal herniated nucleus pulposus on the right at L3-L4 and also the left at L5-S1.  She denies saddle anesthesia or incontinence.  Assessment: Symptoms consistent with piriformis syndrome of the right side.  Plan: - will attempt conservative management with NSAIDs, heat/ice, and physical therapy - Discontinue prior ibuprofen prescription - Prescribe Naproxen 500mg  BID - PT referral - liberal heat and ice as tolerated.

## 2017-01-09 NOTE — Assessment & Plan Note (Addendum)
HPI: Patient reports acute onset of symptoms around Dec 13 (3 days post-op for uterine fibroids).  She reports symptoms onset was when she was sitting at rest on her couch.  Denies trauma or injury to the area.  No rashes or skin lesions.  Symptoms aggravated by sitting too low on her couch.  She has attempted pain control with ibuprfen 400mg  about 3 times per day, ice and heat, and self massage.  All this has provided mild relief.  She has some decreased strength with plantar flexion on exam but it is difficult to determine if due more so to pain.  She has an MRI from 2002 with moderately small foraminal herniated nucleus pulposus on the left at L2-3, moderately small foraminal herniated nucleus pulposus on the right at L3-L4 and also the left at L5-S1.  She denies saddle anesthesia or incontinence.  Assessment: Symptoms consistent with piriformis syndrome of the right side.  Plan: - will attempt conservative management with NSAIDs, heat/ice, and physical therapy - Discontinue prior ibuprofen prescription - Prescribe Naproxen 500mg  BID - PT referral - liberal heat and ice as tolerated.
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
## 2017-01-22 ENCOUNTER — Encounter: Payer: Self-pay | Admitting: Physical Therapy

## 2017-01-22 ENCOUNTER — Ambulatory Visit: Payer: Medicaid Other | Attending: Internal Medicine | Admitting: Physical Therapy

## 2017-01-22 DIAGNOSIS — M25612 Stiffness of left shoulder, not elsewhere classified: Secondary | ICD-10-CM | POA: Diagnosis present

## 2017-01-22 DIAGNOSIS — M25551 Pain in right hip: Secondary | ICD-10-CM | POA: Diagnosis present

## 2017-01-22 DIAGNOSIS — R2689 Other abnormalities of gait and mobility: Secondary | ICD-10-CM | POA: Diagnosis present

## 2017-01-22 DIAGNOSIS — M791 Myalgia, unspecified site: Secondary | ICD-10-CM | POA: Diagnosis present

## 2017-01-22 DIAGNOSIS — R252 Cramp and spasm: Secondary | ICD-10-CM | POA: Diagnosis present

## 2017-01-22 DIAGNOSIS — M25512 Pain in left shoulder: Secondary | ICD-10-CM | POA: Diagnosis present

## 2017-01-22 DIAGNOSIS — M6281 Muscle weakness (generalized): Secondary | ICD-10-CM | POA: Diagnosis present

## 2017-01-22 NOTE — Therapy (Signed)
Sandra Church, [place], [address] Phone: [phone]   Fax:  [phone]  Physical Therapy Evaluation  Patient Details  Name: Sandra Church MRN: [id number] Date of Birth: [birth year] Referring Provider: Dr. Jule Ser   Encounter Date: 01/22/2017  PT End of Session - 01/22/17 1312    Visit Number  1    Number of Visits  4    Date for PT Re-Evaluation  02/26/17    Authorization Type  Medicaid    PT Start Time  0845    PT Stop Time  0921    PT Time Calculation (min)  36 min    Activity Tolerance  Patient tolerated treatment well    Behavior During Therapy  [facility] for tasks assessed/performed       Past Medical History:  Diagnosis Date  . Anemia   . Anxiety   . Arthritis   . Atrophic endometrium 12/18/2016  . Fibromyalgia   . GERD (gastroesophageal reflux disease)   . Hypertension   . Jaundice    AGE 2 OR 21  . Mitral valve problem   . Wears glasses   . Wears partial dentures    UPPER    Past Surgical History:  Procedure Laterality Date  . CLOSED MANIPULATION KNEE WITH STERIOD INJECTION  2011   rt  . COLONOSCOPY    . Cotton Osteotomy with Bone Graft Right 02/23/2014   Rt foot @ [facility]  . DIAGNOSTIC LAPAROSCOPY    . FOOT BONE EXCISION     both feet-spurs  . FOOT SURGERY     plantar fascitis  . HYSTEROSCOPY W/D&C N/A 12/18/2016   Procedure: DILATATION AND CURETTAGE /HYSTEROSCOPY;  Surgeon: Janyth Contes, MD;  Location: [place];  Service: Gynecology;  Laterality: N/A;  . JOINT REPLACEMENT  2011   rt total knee replacement  . KNEE ARTHROPLASTY  2012   rt  . OPEN REDUCTION INTERNAL FIXATION (ORIF) METACARPAL Left 07/06/2012   Procedure: LEFT HAND EXPLORATION WOUNDS, OPEN REDUCTION INTERNAL FIXATION SMALL METACARPAL FRACTURE;  Surgeon: Tennis Must, MD;  Location: [place];  Service: Orthopedics;  Laterality: Left;  . REPAIR EXTENSOR TENDON Left  07/06/2012   Procedure: REPAIR EXTENSOR TENDON;  Surgeon: Tennis Must, MD;  Location: [place];  Service: Orthopedics;  Laterality: Left;  Left   . SHOULDER ARTHROSCOPY WITH DISTAL CLAVICLE RESECTION Left 01/24/2016   Procedure: SHOULDER ARTHROSCOPY WITH DISTAL CLAVICLE RESECTION;  Surgeon: [name], MD;  Location: [place];  Service: Orthopedics;  Laterality: Left;  . SHOULDER ARTHROSCOPY WITH ROTATOR CUFF REPAIR AND SUBACROMIAL DECOMPRESSION Left 01/24/2016   Procedure: LEFT SHOULDER ARTHROSCOPY ACROMIOPLASTY, CLAVICLECTOMY, ARTHROSCOPIC ROTATOR CUFF REPAIR;  Surgeon: [name], MD;  Location: [place];  Service: Orthopedics;  Laterality: Left;  . SHOULDER SURGERY  2010   rt    There were no vitals filed for this visit.   Subjective Assessment - 01/22/17 0848    Subjective  Pt is a 57 y/o female who presents to OPPT with c/o Rt sided sciatica pain x 1 month.  Pt dx with piriformis syndrome by DO.  Pt presents today with difficulty with functional activities, difficulty sleeping (sleeping on floor) and unable to drive.    Limitations  Sitting;Standing;Walking    How long can you sit comfortably?  5 min (pt leaning to Lt)    How long can you stand comfortably?  7-8 minutes  How long can you walk comfortably?  5 min    Patient Stated Goals  improve pain, return to regular activities    Currently in Pain?  Yes    Pain Score  6  up to 8-9/10; at best "7-8"/10 (pt rated pain today 6/10); then stated "right now it's not bothering me too much - pt premedicated    Pain Location  Buttocks    Pain Orientation  Right    Pain Descriptors / Indicators  Tingling;Pins and needles;Sharp    Pain Type  Acute pain    Pain Radiating Towards  RLE to feet    Pain Onset  1 to 4 weeks ago    Pain Frequency  Constant    Aggravating Factors   prolonged positioning, sitting/lying with weight on Rt hip, walking    Pain Relieving Factors   medication, ice/heat, epsom salt baths         [facility] PT Assessment - 01/22/17 0855      Assessment   Medical Diagnosis  Rt piriformis syndrome    Referring Provider  Dr. Jule Ser    Onset Date/Surgical Date  12/25/16    Next MD Visit  03/11/17    Prior Therapy  last year for shoulder at this clinic      Precautions   Precautions  None      Restrictions   Weight Bearing Restrictions  No      Balance Screen   Has the patient fallen in the past 6 months  No    Has the patient had a decrease in activity level because of a fear of falling?   Yes    Is the patient reluctant to leave their home because of a fear of falling?   Yes      [place] residence    Living Arrangements  Alone    Type of [place] to enter    Entrance Stairs-Number of Steps  3    Entrance Stairs-Rails  Right;Left;Can reach both    [place]  Two level;Able to live on main level with bedroom/bathroom    Additional Comments  denies difficulty with stairs into/out of house      Prior Function   Level of Independence  Independent    Vocation  Unemployed    Leisure  gardening, music, puzzles      ROM / Strength   AROM / PROM / Strength  AROM;Strength      Strength   Overall Strength Comments  pt unable to lie on Rt side    Strength Assessment Site  Hip;Knee;Ankle    Right/Left Hip  Right;Left    Right Hip Flexion  3/5    Right Hip Extension  1/5    Right Hip External Rotation   3/5    Right Hip Internal Rotation  5/5    Right Hip ABduction  3-/5    Left Hip Flexion  5/5    Left Hip Extension  4/5    Left Hip External Rotation  4/5    Left Hip Internal Rotation  5/5    Right/Left Knee  Right;Left    Right Knee Flexion  3-/5    Right Knee Extension  5/5    Left Knee Flexion  5/5    Left Knee Extension  5/5    Right/Left Ankle  Right;Left    Right Ankle Dorsiflexion  4/5  Left Ankle Dorsiflexion  5/5      Flexibility    Soft Tissue Assessment /Muscle Length  yes    Hamstrings  tightness on Rt    Piriformis  tightness on Rt with reproduction of pain when stretching      Palpation   Palpation comment  active trigger points noted in Rt glutes; pt withdrawing to light touch, anticipate trigger points in piriformis as well      Ambulation/Gait   Ambulation/Gait  Yes    Ambulation Distance (Feet)  100 Feet    Assistive device  Straight cane    Gait Pattern  Step-to pattern;Decreased stance time - right;Decreased step length - left;Decreased weight shift to right;Antalgic    Ambulation Surface  Level;Indoor             Objective measurements completed on examination: See above findings.      [facility] Adult PT Treatment/Exercise - 01/22/17 0855      Self-Care   Self-Care  Other Self-Care Comments    Other Self-Care Comments   verbally reviewed HEP, instructed in use of tennis ball for myofascial release; educated on DN             PT Education - 01/22/17 0926    Education provided  Yes    Education Details  HEP, DN, use of tennis ball    Person(s) Educated  Patient    Methods  Explanation;Handout    Comprehension  Verbalized understanding;Need further instruction       PT Short Term Goals - 01/22/17 1446      PT SHORT TERM GOAL #1   Title  independent with HEP    Baseline  no HEP    Status  New    Target Date  02/26/17      PT SHORT TERM GOAL #2   Title  report pain < 4/10 for improved function and mobility    Baseline  pain currently 6-9/10    Status  New    Target Date  02/26/17      PT SHORT TERM GOAL #3   Title  report standing and walking tolerance to at least 8 min for improved function    Baseline  5 min or less    Status  New    Target Date  02/26/17      PT SHORT TERM GOAL #4   Title  n/a        PT Long Term Goals - 01/22/17 1448      PT LONG TERM GOAL #1   Title  independent with advanced HEP    Baseline  no HEP    Status  New    Target Date  03/19/17       PT LONG TERM GOAL #2   Title  pt will be able to amb > 15 min without increase in pain for improved function    Baseline  < 5 min    Status  New    Target Date  03/19/17      PT LONG TERM GOAL #3   Title  report pain < 3/10 for improved function and mobiility    Baseline  pain 6-9/10    Status  New    Target Date  03/19/17      PT LONG TERM GOAL #4   Title  amb > 300' without significant gait deviations for improved function and mobility    Baseline  100' with antalgic gait and limited weight bearing on Rt  Status  New    Target Date  03/19/17      PT LONG TERM GOAL #5   Title  demonstrate at least 4/5 Rt hip strength for improved function    Baseline  flexion 3/5; extension 1/5; external rotation 3/5; abduction 3-/5    Status  New    Target Date  03/19/17             Plan - 01/22/17 1312    Clinical Impression Statement  Pt is a 57 y/o female who presents to OPPT for Rt hip/buttock pain radiating down RLE.  Pt's symptoms and clinical findings consistent with piriformis syndrome.  Pt demonstrates decreased strength and active trigger points affecting funtional mobility.  Pt will benefit from PT to address deficits listed.    History and Personal Factors relevant to plan of care:  HTN, fibromyalgia, hx Rt foot surgery    Clinical Presentation  Evolving    Clinical Presentation due to:  increased pain with difficulty ambulating affecting functional mobility    Clinical Decision Making  Moderate    Rehab Potential  Good    PT Frequency  1x / week    PT Duration  4 weeks then plan to reassess and update POC    PT Treatment/Interventions  ADLs/Self Care Home Management;Cryotherapy;Electrical Stimulation;Iontophoresis 4mg /ml Dexamethasone;Moist Heat;Therapeutic exercise;Therapeutic activities;Functional mobility training;Stair training;Gait training;Ultrasound;Balance training;Neuromuscular re-education;Patient/family education;Manual techniques;Dry needling    PT Next  Visit Plan  review stretches; manual/modalities/DN PRN    Consulted and Agree with Plan of Care  Patient       Patient will benefit from skilled therapeutic intervention in order to improve the following deficits and impairments:  Abnormal gait, Pain, Increased fascial restricitons, Increased muscle spasms, Difficulty walking, Decreased balance, Decreased strength, Impaired flexibility, Decreased activity tolerance  Visit Diagnosis: Pain in right hip - Plan: PT plan of care cert/re-cert  Cramp and spasm - Plan: PT plan of care cert/re-cert  Myalgia - Plan: PT plan of care cert/re-cert  Muscle weakness (generalized) - Plan: PT plan of care cert/re-cert  Other abnormalities of gait and mobility - Plan: PT plan of care cert/re-cert     Problem List Patient Active Problem List   Diagnosis Date Noted  . Piriformis syndrome of right side 01/09/2017  . Atrophic endometrium 12/18/2016  . Left flank pain 10/23/2016  . Hypertension 10/08/2016  . Fibromyalgia 10/08/2016  . Osteoarthritis 10/08/2016  . GERD (gastroesophageal reflux disease) 10/08/2016  . Anemia 10/08/2016  . Screening for cholesterol level 10/08/2016  . Preventative health care 10/08/2016  . Screening for diabetes mellitus (DM) 10/08/2016  . Status post right foot surgery 04/12/2014  . 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 Pronation deformity of ankle, acquired 02/01/2014  . Tenosynovitis of right ankle 02/01/2014      Laureen Abrahams, PT, DPT 01/22/17 2:54 PM    [facility] [address] [place], [place], [address] Phone: [phone]   Fax:  [phone]  Name: Sandra Church MRN: [id number] Date of Birth: 1960/09/07

## 2017-01-22 NOTE — Patient Instructions (Signed)
SLR FIBULAR NERVE: Flossing I    Lie on back, hands behind right thigh, knee straight, toes pointed down and in. Keep head down and pull toes up and out. Do _2-3__ sets of __10_ repetitions per session. Do _2-3__ sessions per day.  Piriformis Stretch    Lying on back, pull right knee toward opposite shoulder. Hold __30__ seconds. Repeat __3__ times. Do __2-3__ sessions per day.   Trigger Point Dry Needling  . What is Trigger Point Dry Needling (DN)? o DN is a physical therapy technique used to treat muscle pain and dysfunction. Specifically, DN helps deactivate muscle trigger points (muscle knots).  o A thin filiform needle is used to penetrate the skin and stimulate the underlying trigger point. The goal is for a local twitch response (LTR) to occur and for the trigger point to relax. No medication of any kind is injected during the procedure.   . What Does Trigger Point Dry Needling Feel Like?  o The procedure feels different for each individual patient. Some patients report that they do not actually feel the needle enter the skin and overall the process is not painful. Very mild bleeding may occur. However, many patients feel a deep cramping in the muscle in which the needle was inserted. This is the local twitch response.   Marland Kitchen How Will I feel after the treatment? o Soreness is normal, and the onset of soreness may not occur for a few hours. Typically this soreness does not last longer than two days.  o Bruising is uncommon, however; ice can be used to decrease any possible bruising.  o In rare cases feeling tired or nauseous after the treatment is normal. In addition, your symptoms may get worse before they get better, this period will typically not last longer than 24 hours.   . What Can I do After My Treatment? o Increase your hydration by drinking more water for the next 24 hours. o You may place ice or heat on the areas treated that have become sore, however, do not use heat on  inflamed or bruised areas. Heat often brings more relief post needling. o You can continue your regular activities, but vigorous activity is not recommended initially after the treatment for 24 hours. o DN is best combined with other physical therapy such as strengthening, stretching, and other therapies.

## 2017-02-02 ENCOUNTER — Ambulatory Visit: Payer: Medicaid Other | Admitting: Physical Therapy

## 2017-02-02 DIAGNOSIS — R252 Cramp and spasm: Secondary | ICD-10-CM

## 2017-02-02 DIAGNOSIS — M791 Myalgia, unspecified site: Secondary | ICD-10-CM

## 2017-02-02 DIAGNOSIS — M25551 Pain in right hip: Secondary | ICD-10-CM | POA: Diagnosis not present

## 2017-02-02 DIAGNOSIS — M6281 Muscle weakness (generalized): Secondary | ICD-10-CM

## 2017-02-02 DIAGNOSIS — M25612 Stiffness of left shoulder, not elsewhere classified: Secondary | ICD-10-CM

## 2017-02-02 DIAGNOSIS — R2689 Other abnormalities of gait and mobility: Secondary | ICD-10-CM

## 2017-02-02 DIAGNOSIS — M25512 Pain in left shoulder: Secondary | ICD-10-CM

## 2017-02-03 ENCOUNTER — Encounter: Payer: Self-pay | Admitting: Physical Therapy

## 2017-02-03 NOTE — Therapy (Signed)
[place], [place], [address] Phone: [phone]   Fax:  [phone]  Physical Therapy Treatment  Patient Details  Name: Sandra Church MRN: [id number] Date of Birth: [birth year] Referring Provider: Dr. Jule Ser   Encounter Date: 02/02/2017  PT End of Session - 02/03/17 1420    Visit Number  2    Number of Visits  4    Date for PT Re-Evaluation  02/26/17    Authorization Type  Medicaid    PT Start Time  0845    PT Stop Time  0925    PT Time Calculation (min)  40 min    Activity Tolerance  Patient tolerated treatment well    Behavior During Therapy  [facility] for tasks assessed/performed       Past Medical History:  Diagnosis Date  . Anemia   . Anxiety   . Arthritis   . Atrophic endometrium 12/18/2016  . Fibromyalgia   . GERD (gastroesophageal reflux disease)   . Hypertension   . Jaundice    AGE 57 OR 57  . Mitral valve problem   . Wears glasses   . Wears partial dentures    UPPER    Past Surgical History:  Procedure Laterality Date  . CLOSED MANIPULATION KNEE WITH STERIOD INJECTION  2011   rt  . COLONOSCOPY    . Cotton Osteotomy with Bone Graft Right 02/23/2014   Rt foot @ [facility]  . DIAGNOSTIC LAPAROSCOPY    . FOOT BONE EXCISION     both feet-spurs  . FOOT SURGERY     plantar fascitis  . HYSTEROSCOPY W/D&C N/A 12/18/2016   Procedure: DILATATION AND CURETTAGE /HYSTEROSCOPY;  Surgeon: Janyth Contes, MD;  Location: [place];  Service: Gynecology;  Laterality: N/A;  . JOINT REPLACEMENT  2011   rt total knee replacement  . KNEE ARTHROPLASTY  2012   rt  . OPEN REDUCTION INTERNAL FIXATION (ORIF) METACARPAL Left 07/06/2012   Procedure: LEFT HAND EXPLORATION WOUNDS, OPEN REDUCTION INTERNAL FIXATION SMALL METACARPAL FRACTURE;  Surgeon: Tennis Must, MD;  Location: [place];  Service: Orthopedics;  Laterality: Left;  . REPAIR EXTENSOR TENDON Left  07/06/2012   Procedure: REPAIR EXTENSOR TENDON;  Surgeon: Tennis Must, MD;  Location: [place];  Service: Orthopedics;  Laterality: Left;  Left   . SHOULDER ARTHROSCOPY WITH DISTAL CLAVICLE RESECTION Left 01/24/2016   Procedure: SHOULDER ARTHROSCOPY WITH DISTAL CLAVICLE RESECTION;  Surgeon: [name], MD;  Location: [place];  Service: Orthopedics;  Laterality: Left;  . SHOULDER ARTHROSCOPY WITH ROTATOR CUFF REPAIR AND SUBACROMIAL DECOMPRESSION Left 01/24/2016   Procedure: LEFT SHOULDER ARTHROSCOPY ACROMIOPLASTY, CLAVICLECTOMY, ARTHROSCOPIC ROTATOR CUFF REPAIR;  Surgeon: [name], MD;  Location: [place];  Service: Orthopedics;  Laterality: Left;  . SHOULDER SURGERY  2010   rt    There were no vitals filed for this visit.  Subjective Assessment - 02/02/17 0903    Subjective  atient reports her pain has improved but she iss till having radicualr pain down her leg. She feels she is moving better but she is still limited. She has been working on her exercises.  Patient also reports some soreness in her ankle.   (Pended)     Limitations  Sitting;Standing;Walking    How long can you sit comfortably?  5 min (pt leaning to Lt)    How long can you stand comfortably?  7-8 minutes  How long can you walk comfortably?  5 min    Patient Stated Goals  improve pain, return to regular activities    Currently in Pain?  Yes    Pain Score  6     Pain Orientation  Right    Pain Descriptors / Indicators  Tingling    Pain Type  Acute pain    Pain Onset  1 to 4 weeks ago    Pain Frequency  Constant    Aggravating Factors   prolonged positioning, sitting    Pain Relieving Factors  medication, heat     Multiple Pain Sites  No                      [facility] Adult PT Treatment/Exercise - 02/03/17 0001      Lumbar Exercises: Stretches   Lower Trunk Rotation Limitations  x10     Piriformis Stretch Limitations  2x20 sec hold        Lumbar Exercises: Supine   AB Set Limitations  reviewed x10 with mod cuin g    Clam Limitations  x10 yellow band     Other Supine Lumbar Exercises  quad set 2x10       Manual Therapy   Manual Therapy  Soft tissue mobilization;Myofascial release;Joint mobilization    Joint Mobilization  LAD to right lower extremity     Soft tissue mobilization  IT band roll out; Soft tissue moibilization     Myofascial Release  to lumbar spine              PT Education - 02/03/17 1420    Education provided  Yes    Education Details  HEP; self soft tissue moibilization     Person(s) Educated  Patient    Methods  Explanation;Demonstration;Tactile cues;Verbal cues    Comprehension  Verbalized understanding;Returned demonstration;Verbal cues required;Tactile cues required       PT Short Term Goals - 02/03/17 1515      PT SHORT TERM GOAL #2   Time  4      PT SHORT TERM GOAL #3   Baseline  5 min or less        PT Long Term Goals - 02/03/17 1520      PT LONG TERM GOAL #1   Title  independent with advanced HEP    Baseline  no HEP    Time  8    Period  Weeks    Status  On-going      PT LONG TERM GOAL #2   Title  pt will be able to amb > 15 min without increase in pain for improved function    Baseline  < 5 min    Time  8    Period  Weeks    Status  On-going      PT LONG TERM GOAL #3   Title  report pain < 3/10 for improved function and mobiility    Baseline  pain 6-9/10    Time  8    Period  Weeks    Status  On-going      PT LONG TERM GOAL #4   Title  amb > 300' without significant gait deviations for improved function and mobility    Baseline  100' with antalgic gait and limited weight bearing on Rt    Period  Weeks    Status  On-going      PT LONG TERM GOAL #5   Title  demonstrate at  least 4/5 Rt hip strength for improved function    Baseline  flexion 3/5; extension 1/5; external rotation 3/5; abduction 3-/5    Period  Weeks    Status  On-going            Plan  - 02/03/17 1422    Clinical Impression Statement  Therapy did not perfrom dry needling 2nd to significant tenderness to palpation. She tolerated treatment well. She required md cuing for technique with ther-ex. Therapy updated her HEP. She reports some instability in her ankle when she walks. She was given 3 way ankle band exercises to work on.     Clinical Presentation  Evolving    Clinical Decision Making  Moderate    Rehab Potential  Good    PT Frequency  1x / week    PT Duration  4 weeks    PT Treatment/Interventions  ADLs/Self Care Home Management;Cryotherapy;Electrical Stimulation;Iontophoresis 4mg /ml Dexamethasone;Moist Heat;Therapeutic exercise;Therapeutic activities;Functional mobility training;Stair training;Gait training;Ultrasound;Balance training;Neuromuscular re-education;Patient/family education;Manual techniques;Dry needling    PT Next Visit Plan  review stretches; manual/modalities/DN PRN    Consulted and Agree with Plan of Care  Patient       Patient will benefit from skilled therapeutic intervention in order to improve the following deficits and impairments:  Abnormal gait, Pain, Increased fascial restricitons, Increased muscle spasms, Difficulty walking, Decreased balance, Decreased strength, Impaired flexibility, Decreased activity tolerance  Visit Diagnosis: Pain in right hip  Cramp and spasm  Myalgia  Muscle weakness (generalized)  Other abnormalities of gait and mobility  Stiffness of left shoulder, not elsewhere classified  Acute pain of left shoulder     Problem List Patient Active Problem List   Diagnosis Date Noted  . Piriformis syndrome of right side 01/09/2017  . Atrophic endometrium 12/18/2016  . Left flank pain 10/23/2016  . Hypertension 10/08/2016  . Fibromyalgia 10/08/2016  . Osteoarthritis 10/08/2016  . GERD (gastroesophageal reflux disease) 10/08/2016  . Anemia 10/08/2016  . Screening for cholesterol level 10/08/2016  . Preventative  health care 10/08/2016  . Screening for diabetes mellitus (DM) 10/08/2016  . Status post right foot surgery 04/12/2014  . 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 Pronation deformity of ankle, acquired 02/01/2014  . Tenosynovitis of right ankle 02/01/2014    Carney Living PT DPT  02/03/2017, 3:21 PM  [facility] [address] [place], [place], [address] Phone: [phone]   Fax:  [phone]  Name: Sandra Church MRN: [id number] Date of Birth: 08-11-1960

## 2017-02-09 ENCOUNTER — Ambulatory Visit: Payer: Medicaid Other | Admitting: Physical Therapy

## 2017-02-16 ENCOUNTER — Encounter: Payer: Self-pay | Admitting: Physical Therapy

## 2017-02-16 ENCOUNTER — Encounter (HOSPITAL_COMMUNITY): Payer: Self-pay | Admitting: *Deleted

## 2017-02-16 ENCOUNTER — Emergency Department (HOSPITAL_COMMUNITY): Payer: Medicaid Other

## 2017-02-16 ENCOUNTER — Ambulatory Visit: Payer: Medicaid Other | Attending: Internal Medicine | Admitting: Physical Therapy

## 2017-02-16 ENCOUNTER — Emergency Department (HOSPITAL_COMMUNITY)
Admission: EM | Admit: 2017-02-16 | Discharge: 2017-02-16 | Disposition: A | Discharge status: Home / Self Care | Payer: Medicaid Other | Attending: Emergency Medicine | Admitting: Emergency Medicine

## 2017-02-16 ENCOUNTER — Other Ambulatory Visit: Payer: Self-pay

## 2017-02-16 DIAGNOSIS — M6281 Muscle weakness (generalized): Secondary | ICD-10-CM | POA: Insufficient documentation

## 2017-02-16 DIAGNOSIS — Z79899 Other long term (current) drug therapy: Secondary | ICD-10-CM | POA: Diagnosis not present

## 2017-02-16 DIAGNOSIS — M5441 Lumbago with sciatica, right side: Secondary | ICD-10-CM | POA: Diagnosis not present

## 2017-02-16 DIAGNOSIS — Z96651 Presence of right artificial knee joint: Secondary | ICD-10-CM | POA: Insufficient documentation

## 2017-02-16 DIAGNOSIS — M545 Low back pain: Secondary | ICD-10-CM | POA: Diagnosis present

## 2017-02-16 DIAGNOSIS — M79604 Pain in right leg: Secondary | ICD-10-CM | POA: Diagnosis not present

## 2017-02-16 DIAGNOSIS — R252 Cramp and spasm: Secondary | ICD-10-CM | POA: Insufficient documentation

## 2017-02-16 DIAGNOSIS — I10 Essential (primary) hypertension: Secondary | ICD-10-CM | POA: Diagnosis not present

## 2017-02-16 DIAGNOSIS — M791 Myalgia, unspecified site: Secondary | ICD-10-CM | POA: Insufficient documentation

## 2017-02-16 DIAGNOSIS — R2689 Other abnormalities of gait and mobility: Secondary | ICD-10-CM | POA: Insufficient documentation

## 2017-02-16 DIAGNOSIS — M25551 Pain in right hip: Secondary | ICD-10-CM

## 2017-02-16 MED ORDER — CYCLOBENZAPRINE HCL 5 MG PO TABS: 5.0000 mg | ORAL_TABLET | Freq: Three times a day (TID) | ORAL | 0 refills | Status: DC | PRN

## 2017-02-16 MED ORDER — METHYLPREDNISOLONE 4 MG PO TBPK: ORAL_TABLET | ORAL | 0 refills | Status: DC

## 2017-02-16 MED ORDER — OXYCODONE-ACETAMINOPHEN 5-325 MG PO TABS
1.0000 | ORAL_TABLET | ORAL | Status: DC | PRN
  Administered 2017-02-16: 1 via ORAL
  Filled 2017-02-16: qty 1

## 2017-02-16 MED ORDER — HYDROCODONE-ACETAMINOPHEN 5-325 MG PO TABS: 1.0000 | ORAL_TABLET | Freq: Four times a day (QID) | ORAL | 0 refills | Status: DC | PRN

## 2017-02-16 NOTE — ED Triage Notes (Signed)
Pt reports ongoing lower back pain that radiates down right leg. Went to PT today but sent here due to severe pain. Unable to bear weight on right leg.

## 2017-02-16 NOTE — ED Notes (Signed)
Patient transported to X-ray 

## 2017-02-16 NOTE — ED Provider Notes (Signed)
[facility] EMERGENCY DEPARTMENT Provider Note   CSN: [id number] Arrival date & time: 02/16/17  0911     History   Chief Complaint Chief Complaint  Patient presents with  . Back Pain    HPI Sandra Church is a 57 y.o. female.  The history is provided by the patient and medical records. No language interpreter was used.  Back Pain   Pertinent negatives include no weakness.   Sandra Church is a 57 y.o. female  with a PMH as listed below who presents to the Emergency Department complaining of central and right-sided low back pain since mid December.  Pain radiates down the right leg all the way to the big toe and feels like a "needle prick" sensation down the leg.  Pain worse with certain movements.  He has not tried any medications, however she has tried a constellation of treatments such as Epson salt, ice, heat, massage. Patient reports that she was seen by her primary doctor just after Christmas where she was referred to physical therapy.  She reports going to physical therapy twice in January, but does not feel like it is helping.  She actually feels as if her pain is getting worse.  She went to PT today where she was referred to emergency department due to amount of pain that she appeared to be in.  Patient denies upper back or neck pain.  No fever, saddle anesthesia, weakness, urinary complaints including retention/incontinence.  No history of cancer, IV drug use or recent spinal procedures.  Per chart review, she does have an MRI from 2002 which does show herniated disc at L2-3 as well as small herniation at L3-4 and L5-S1.  Past Medical History:  Diagnosis Date  . Anemia   . Anxiety   . Arthritis   . Atrophic endometrium 12/18/2016  . Fibromyalgia   . GERD (gastroesophageal reflux disease)   . Hypertension   . Jaundice    AGE 63 OR 21  . Mitral valve problem   . Wears glasses   . Wears partial dentures    UPPER    Patient Active Problem List     Diagnosis Date Noted  . Piriformis syndrome of right side 01/09/2017  . Atrophic endometrium 12/18/2016  . Left flank pain 10/23/2016  . Hypertension 10/08/2016  . Fibromyalgia 10/08/2016  . Osteoarthritis 10/08/2016  . GERD (gastroesophageal reflux disease) 10/08/2016  . Anemia 10/08/2016  . Screening for cholesterol level 10/08/2016  . Preventative health care 10/08/2016  . Screening for diabetes mellitus (DM) 10/08/2016  . Status post right foot surgery 04/12/2014  . 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 Pronation deformity of ankle, acquired 02/01/2014  . Tenosynovitis of right ankle 02/01/2014    Past Surgical History:  Procedure Laterality Date  . CLOSED MANIPULATION KNEE WITH STERIOD INJECTION  2011   rt  . COLONOSCOPY    . Cotton Osteotomy with Bone Graft Right 02/23/2014   Rt foot @ [facility]  . DIAGNOSTIC LAPAROSCOPY    . FOOT BONE EXCISION     both feet-spurs  . FOOT SURGERY     plantar fascitis  . HYSTEROSCOPY W/D&C N/A 12/18/2016   Procedure: DILATATION AND CURETTAGE /HYSTEROSCOPY;  Surgeon: Janyth Contes, MD;  Location: [place];  Service: Gynecology;  Laterality: N/A;  . JOINT REPLACEMENT  2011   rt total knee replacement  . KNEE ARTHROPLASTY  2012   rt  . OPEN REDUCTION INTERNAL FIXATION (ORIF) METACARPAL Left 07/06/2012   Procedure:  LEFT HAND EXPLORATION WOUNDS, OPEN REDUCTION INTERNAL FIXATION SMALL METACARPAL FRACTURE;  Surgeon: Tennis Must, MD;  Location: [place];  Service: Orthopedics;  Laterality: Left;  . REPAIR EXTENSOR TENDON Left 07/06/2012   Procedure: REPAIR EXTENSOR TENDON;  Surgeon: Tennis Must, MD;  Location: [place];  Service: Orthopedics;  Laterality: Left;  Left   . SHOULDER ARTHROSCOPY WITH DISTAL CLAVICLE RESECTION Left 01/24/2016   Procedure: SHOULDER ARTHROSCOPY WITH DISTAL CLAVICLE RESECTION;  Surgeon: [name], MD;  Location: [place];  Service: Orthopedics;   Laterality: Left;  . SHOULDER ARTHROSCOPY WITH ROTATOR CUFF REPAIR AND SUBACROMIAL DECOMPRESSION Left 01/24/2016   Procedure: LEFT SHOULDER ARTHROSCOPY ACROMIOPLASTY, CLAVICLECTOMY, ARTHROSCOPIC ROTATOR CUFF REPAIR;  Surgeon: [name], MD;  Location: [place];  Service: Orthopedics;  Laterality: Left;  . SHOULDER SURGERY  2010   rt    OB History    No data available       Home Medications    Prior to Admission medications   Medication Sig Start Date End Date Taking? Authorizing Provider  amLODipine (NORVASC) 5 MG tablet Take 5 mg by mouth every morning.  12/24/13   [provider]  Aspirin-Salicylamide-Caffeine (ARTHRITIS STRENGTH BC POWDER PO) BC Arthritis 1,000 mg-65 mg oral powder packet  Take by oral route.    [provider]  cyclobenzaprine (FLEXERIL) 5 MG tablet Take 1 tablet (5 mg total) by mouth 3 (three) times daily as needed for muscle spasms. 02/16/17   Yanna Leaks, Ozella Almond, PA-C  diphenhydrAMINE (BENADRYL) 25 mg capsule Take 1 capsule (25 mg total) by mouth every 6 (six) hours as needed. Patient taking differently: Take 25 mg by mouth every 6 (six) hours as needed for allergies.  02/29/16   Hedges, Dellis Filbert, PA-C  estradiol-norethindrone [facility]) 0.05-0.14 MG/DAY CombiPatch 0.05 mg-0.14 mg/24 hr transdermal  Apply 1 patch twice a week by transdermal route.    [provider]  gabapentin (NEURONTIN) 100 MG capsule Take 3 capsules (300 mg total) by mouth 3 (three) times daily. 10/07/16 10/07/17  Melanee Spry, MD  HYDROcodone-acetaminophen (NORCO) 5-325 MG tablet Take 1 tablet by mouth every 6 (six) hours as needed for moderate pain. 02/16/17   Decarlo Rivet, Ozella Almond, PA-C  losartan (COZAAR) 100 MG tablet Take 100 mg by mouth every morning.  01/16/14   [provider]  methylPREDNISolone (MEDROL DOSEPAK) 4 MG TBPK tablet Take as directed on package. 02/16/17   Javarion Douty, Ozella Almond, PA-C  metoprolol succinate (TOPROL-XL) 100  MG 24 hr tablet Take 100 mg by mouth every morning. Take with or immediately following a meal.    [provider]  naproxen (NAPROSYN) 500 MG tablet Take 1 tablet (500 mg total) by mouth 2 (two) times daily with a meal. 01/09/17 01/09/18  Jule Ser, DO  omeprazole (PRILOSEC) 40 MG capsule as needed.  12/24/13   [provider]  ondansetron (ZOFRAN) 4 MG tablet Take 1 tablet (4 mg total) by mouth every 8 (eight) hours as needed for nausea or vomiting. 01/24/16   Aundra Dubin, PA-C  oxyCODONE-acetaminophen (ROXICET) 5-325 MG tablet Take 1 tablet by mouth every 6 (six) hours as needed for severe pain. Patient not taking: Reported on 01/22/2017 12/18/16   Janyth Contes, MD    Family History Family History  Problem Relation Age of Onset  . Diabetes Mother   . Stroke Mother   . Cancer Father   . Hypertension Father     Social  History Social History   Tobacco Use  . Smoking status: Never Smoker  . Smokeless tobacco: Never Used  Substance Use Topics  . Alcohol use: No    Alcohol/week: 0.0 oz    Frequency: Never  . Drug use: No     Allergies   Shrimp [shellfish allergy]; Codeine; and Shea butter   Review of Systems Review of Systems  Musculoskeletal: Positive for back pain.  Neurological: Negative for weakness.       + tingling  All other systems reviewed and are negative.    Physical Exam Updated Vital Signs BP (!) 147/96 (BP Location: Right Arm)   Pulse 73   Temp 97.7 F (36.5 C) (Oral)   Resp 18   SpO2 100%   Physical Exam  Constitutional: She is oriented to person, place, and time. She appears well-developed and well-nourished.  Neck:  No midline or paraspinal tenderness. Full ROM without pain.  Cardiovascular: Normal rate, regular rhythm, normal heart sounds and intact distal pulses.  Pulmonary/Chest: Effort normal and breath sounds normal. No respiratory distress.  Abdominal: Soft. Bowel sounds are normal. She exhibits no  distension. There is no tenderness.  Musculoskeletal:  Tenderness to palpation of midline and right lumbar paraspinal musculature as well as right buttock. 5/5 muscle strength in bilateral LE's. Straight leg raises are positive on right.  Neurological: She is alert and oriented to person, place, and time. She has normal reflexes.  2+ DP bilaterally.  Diminished sensation to the right lateral aspect of the lower leg as compared to left.  Skin: Skin is warm and dry. No rash noted. No erythema.  Nursing note and vitals reviewed.    ED Treatments / Results  Labs (all labs ordered are listed, but only abnormal results are displayed) Labs Reviewed - No data to display  EKG  EKG Interpretation None       Radiology Dg Lumbar Spine Complete  Result Date: 02/16/2017 CLINICAL DATA:  Right-sided low back pain extending into the hips. Pain goes into the right lower extremity to the foot. EXAM: LUMBAR SPINE - COMPLETE 4+ VIEW COMPARISON:  CT of the abdomen and pelvis 11/24/2007 FINDINGS: Five non rib-bearing lumbar type vertebral bodies are present. Grade 1 anterolisthesis at L4-5 is secondary to progressive advanced facet hypertrophy at this level. There is chronic asymmetric right-sided endplate change at F1-6 with vacuum disc. Mild facet hypertrophy is also present at L3-4 bilaterally. IMPRESSION: 1. Grade 1 anterolisthesis is associated with moderate bilateral facet hypertrophy at L4-5. 2. Chronic asymmetric right-sided degenerative disc disease with endplate change at B8-4. Electronically Signed   By: San Morelle M.D.   On: 02/16/2017 15:40   Dg Hip Unilat W Or Wo Pelvis 2-3 Views Right  Result Date: 02/16/2017 CLINICAL DATA:  Right-sided low back pain radiating to the right hip. EXAM: DG HIP (WITH OR WITHOUT PELVIS) 2-3V RIGHT COMPARISON:  CT abdomen pelvis dated November 24, 2007. FINDINGS: No acute fracture or malalignment. Degenerative changes of the bilateral sacroiliac joints and  pubic symphysis. The hip joint spaces are preserved. Soft tissues are unremarkable. IMPRESSION: No acute osseous abnormality or significant degenerative changes of the right hip. Electronically Signed   By: Titus Dubin M.D.   On: 02/16/2017 15:36    Procedures Procedures (including critical care time)  Medications Ordered in ED Medications  oxyCODONE-acetaminophen (PERCOCET/ROXICET) 5-325 MG per tablet 1 tablet (1 tablet Oral Given 02/16/17 1006)     Initial Impression / Assessment and Plan / ED Course  I  have reviewed the triage vital signs and the nursing notes.  Pertinent labs & imaging results that were available during my care of the patient were reviewed by me and considered in my medical decision making (see chart for details).    MIALYNN SHELVIN is a 56 y.o. female who presents to ED for progressively worsening low back pain with radiation of pain down right lower extremity. She does have diminished sensation to lateral aspect of the right lower leg when compared to left. She does have 5/5 muscle strength to bilateral LE's. 2+ pulses bilaterally. She has no saddle anesthesia, bowel or bladder incontinence. No concern for cauda equina. No fevers or other infectious symptoms to suggest that the patient's back pain is due to an infection. X-rays obtained and reviewed with attending, Dr. Marcha Dutton, showing grade 1 anterolisthesis with moderate bilateral facet hypertrophy at L4-5L5 and chronic degenerative changes. Will refer to neurosurgery and start symptomatic medication for pain relief. Reasons to return to ER were discussed .Patient voiced understanding and agreement with plan. All questions answered.   Patient discussed with Dr. Marcha Dutton who agrees with treatment plan.    Final Clinical Impressions(s) / ED Diagnoses   Final diagnoses:  Acute right-sided low back pain with right-sided sciatica    ED Discharge Orders        Ordered    methylPREDNISolone (MEDROL DOSEPAK) 4 MG TBPK  tablet     02/16/17 1559    HYDROcodone-acetaminophen (NORCO) 5-325 MG tablet  Every 6 hours PRN     02/16/17 1559    cyclobenzaprine (FLEXERIL) 5 MG tablet  3 times daily PRN     02/16/17 1559       Sincerity Cedar, Ozella Almond, PA-C 02/16/17 1613    Pixie Casino, MD 02/16/17 [phone]

## 2017-02-16 NOTE — Discharge Instructions (Signed)
It was my pleasure taking care of you today!   You have been seen in the Emergency Department today for back pain.  Please call the spine clinic first thing in the morning to schedule a follow up appointment.   Take medrol dose pack as directed. I have refilled your muscle relaxant (flexeril). I have also given you a prescription for pain medication to take as needed only for severe pain - This can make you very drowsy - please do not drink alcohol, operate heavy machinery or drive on this medication.   In addition to this, use ice and/or heat for additional pain relief. You can also use tylenol and/or ibuprofen.  COLD THERAPY DIRECTIONS:  Ice or gel packs can be used to reduce both pain and swelling. Ice is the most helpful within the first 24 to 48 hours after an injury or flareup from overusing a muscle or joint.  Ice is effective, has very few side effects, and is safe for most people to use.    Return to the ED for worsening back pain, fever, weakness or numbness of either leg, or if you develop either (1) an inability to urinate or have bowel movements, or (2) loss of your ability to control your bathroom functions (if you start having "accidents"), or if you develop other new symptoms that concern you.

## 2017-02-16 NOTE — Therapy (Addendum)
[place], [place], [address] Phone: [phone]   Fax:  [phone]  Physical Therapy Treatment  Patient Details  Name: ALIANIS TRIMMER MRN: [id number] Date of Birth: [birth year] Referring Provider: Dr. Jule Ser   Encounter Date: 02/16/2017  PT End of Session - 02/16/17 1026    Visit Number  -- No visit     Number of Visits  4    Date for PT Re-Evaluation  02/26/17    PT Start Time  0845 No visit     PT Stop Time  0904    PT Time Calculation (min)  19 min    Activity Tolerance  Patient tolerated treatment well    Behavior During Therapy  [facility] for tasks assessed/performed       Past Medical History:  Diagnosis Date  . Anemia   . Anxiety   . Arthritis   . Atrophic endometrium 12/18/2016  . Fibromyalgia   . GERD (gastroesophageal reflux disease)   . Hypertension   . Jaundice    AGE 57 OR 21  . Mitral valve problem   . Wears glasses   . Wears partial dentures    UPPER    Past Surgical History:  Procedure Laterality Date  . CLOSED MANIPULATION KNEE WITH STERIOD INJECTION  2011   rt  . COLONOSCOPY    . Cotton Osteotomy with Bone Graft Right 02/23/2014   Rt foot @ [facility]  . DIAGNOSTIC LAPAROSCOPY    . FOOT BONE EXCISION     both feet-spurs  . FOOT SURGERY     plantar fascitis  . HYSTEROSCOPY W/D&C N/A 12/18/2016   Procedure: DILATATION AND CURETTAGE /HYSTEROSCOPY;  Surgeon: Janyth Contes, MD;  Location: [place];  Service: Gynecology;  Laterality: N/A;  . JOINT REPLACEMENT  2011   rt total knee replacement  . KNEE ARTHROPLASTY  2012   rt  . OPEN REDUCTION INTERNAL FIXATION (ORIF) METACARPAL Left 07/06/2012   Procedure: LEFT HAND EXPLORATION WOUNDS, OPEN REDUCTION INTERNAL FIXATION SMALL METACARPAL FRACTURE;  Surgeon: Tennis Must, MD;  Location: [place];  Service: Orthopedics;  Laterality: Left;  . REPAIR EXTENSOR TENDON Left 07/06/2012   Procedure: REPAIR EXTENSOR TENDON;  Surgeon: Tennis Must, MD;  Location: [place];  Service: Orthopedics;  Laterality: Left;  Left   . SHOULDER ARTHROSCOPY WITH DISTAL CLAVICLE RESECTION Left 01/24/2016   Procedure: SHOULDER ARTHROSCOPY WITH DISTAL CLAVICLE RESECTION;  Surgeon: [name], MD;  Location: [place];  Service: Orthopedics;  Laterality: Left;  . SHOULDER ARTHROSCOPY WITH ROTATOR CUFF REPAIR AND SUBACROMIAL DECOMPRESSION Left 01/24/2016   Procedure: LEFT SHOULDER ARTHROSCOPY ACROMIOPLASTY, CLAVICLECTOMY, ARTHROSCOPIC ROTATOR CUFF REPAIR;  Surgeon: [name], MD;  Location: [place];  Service: Orthopedics;  Laterality: Left;  . SHOULDER SURGERY  2010   rt    There were no vitals filed for this visit.  Subjective Assessment - 02/16/17 0926    Subjective  Patient cones  in today with intractable back pain. She reports the pain is 10/10. She can not sit down but she feels like her right leg is going to collapse. Therapy tried to lay her down but she exepressed intense pain in sidelying supine and prone postions. Patient will no be treated, She was advised to go to the ED.     Limitations  Sitting;Standing;Walking    How long can you sit comfortably?  5 min (pt leaning to Lt)  How long can you stand comfortably?  7-8 minutes    How long can you walk comfortably?  5 min    Patient Stated Goals  improve pain, return to regular activities    Currently in Pain?  Yes    Pain Score  10-Worst pain ever    Pain Location  Buttocks    Pain Orientation  Right    Pain Descriptors / Indicators  Aching;Tingling    Pain Type  Acute pain    Pain Onset  1 to 4 weeks ago    Pain Frequency  Constant                              PT Education - 02/16/17 1026    Education provided  Yes    Education Details  go to ED to be checked out     Person(s) Educated  Patient    Methods  Explanation    Comprehension   Verbalized understanding       PT Short Term Goals - 02/03/17 1515      PT SHORT TERM GOAL #2   Time  4      PT SHORT TERM GOAL #3   Baseline  5 min or less        PT Long Term Goals - 02/03/17 1520      PT LONG TERM GOAL #1   Title  independent with advanced HEP    Baseline  no HEP    Time  8    Period  Weeks    Status  On-going      PT LONG TERM GOAL #2   Title  pt will be able to amb > 15 min without increase in pain for improved function    Baseline  < 5 min    Time  8    Period  Weeks    Status  On-going      PT LONG TERM GOAL #3   Title  report pain < 3/10 for improved function and mobiility    Baseline  pain 6-9/10    Time  8    Period  Weeks    Status  On-going      PT LONG TERM GOAL #4   Title  amb > 300' without significant gait deviations for improved function and mobility    Baseline  100' with antalgic gait and limited weight bearing on Rt    Period  Weeks    Status  On-going      PT LONG TERM GOAL #5   Title  demonstrate at least 4/5 Rt hip strength for improved function    Baseline  flexion 3/5; extension 1/5; external rotation 3/5; abduction 3-/5    Period  Weeks    Status  On-going            Plan - 02/16/17 1026    Clinical Impression Statement  No visit. Patient sent to ED     Rehab Potential  Good    PT Frequency  1x / week    PT Duration  4 weeks    PT Treatment/Interventions  ADLs/Self Care Home Management;Cryotherapy;Electrical Stimulation;Iontophoresis 34m/ml Dexamethasone;Moist Heat;Therapeutic exercise;Therapeutic activities;Functional mobility training;Stair training;Gait training;Ultrasound;Balance training;Neuromuscular re-education;Patient/family education;Manual techniques;Dry needling    PT Next Visit Plan  review stretches; manual/modalities/DN PRN    Consulted and Agree with Plan of Care  Patient       Patient will benefit from skilled therapeutic  intervention in order to improve the following deficits and  impairments:  Abnormal gait, Pain, Increased fascial restricitons, Increased muscle spasms, Difficulty walking, Decreased balance, Decreased strength, Impaired flexibility, Decreased activity tolerance  Visit Diagnosis: Pain in right hip  Cramp and spasm  Myalgia  Muscle weakness (generalized)  Other abnormalities of gait and mobility  PHYSICAL THERAPY DISCHARGE SUMMARY  Visits from Start of Care: 3  Current functional level related to goals / functional outcomes: Significant pain with all activity     Remaining deficits: Unknown    Education / Equipment: HEP  Plan: Patient agrees to discharge.  Patient goals were not met. Patient is being discharged due to meeting the stated rehab goals.  ?????       Problem List Patient Active Problem List   Diagnosis Date Noted  . Piriformis syndrome of right side 01/09/2017  . Atrophic endometrium 12/18/2016  . Left flank pain 10/23/2016  . Hypertension 10/08/2016  . Fibromyalgia 10/08/2016  . Osteoarthritis 10/08/2016  . GERD (gastroesophageal reflux disease) 10/08/2016  . Anemia 10/08/2016  . Screening for cholesterol level 10/08/2016  . Preventative health care 10/08/2016  . Screening for diabetes mellitus (DM) 10/08/2016  . Status post right foot surgery 04/12/2014  . 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 Pronation deformity of ankle, acquired 02/01/2014  . Tenosynovitis of right ankle 02/01/2014    Carney Living PT DPT  02/16/2017, 4:01 PM  [facility] [address] [place], [place], [address] Phone: [phone]   Fax:  [phone]  Name: MILENA LIGGETT MRN: [id number] Date of Birth: 1960/06/10

## 2017-03-11 ENCOUNTER — Ambulatory Visit (INDEPENDENT_AMBULATORY_CARE_PROVIDER_SITE_OTHER): Payer: Medicaid Other | Admitting: Internal Medicine

## 2017-03-11 ENCOUNTER — Other Ambulatory Visit: Payer: Self-pay

## 2017-03-11 ENCOUNTER — Encounter: Payer: Self-pay | Admitting: Internal Medicine

## 2017-03-11 DIAGNOSIS — I10 Essential (primary) hypertension: Secondary | ICD-10-CM

## 2017-03-11 DIAGNOSIS — M5386 Other specified dorsopathies, lumbar region: Secondary | ICD-10-CM | POA: Diagnosis not present

## 2017-03-11 DIAGNOSIS — Z79899 Other long term (current) drug therapy: Secondary | ICD-10-CM

## 2017-03-11 DIAGNOSIS — G8929 Other chronic pain: Secondary | ICD-10-CM

## 2017-03-11 DIAGNOSIS — R202 Paresthesia of skin: Secondary | ICD-10-CM

## 2017-03-11 DIAGNOSIS — Z791 Long term (current) use of non-steroidal anti-inflammatories (NSAID): Secondary | ICD-10-CM

## 2017-03-11 DIAGNOSIS — K219 Gastro-esophageal reflux disease without esophagitis: Secondary | ICD-10-CM

## 2017-03-11 DIAGNOSIS — M797 Fibromyalgia: Secondary | ICD-10-CM | POA: Diagnosis not present

## 2017-03-11 DIAGNOSIS — M5410 Radiculopathy, site unspecified: Secondary | ICD-10-CM

## 2017-03-11 DIAGNOSIS — M5116 Intervertebral disc disorders with radiculopathy, lumbar region: Secondary | ICD-10-CM | POA: Diagnosis not present

## 2017-03-11 HISTORY — DX: Radiculopathy, site unspecified: M54.10

## 2017-03-11 MED ORDER — DICLOFENAC SODIUM 1 % TD GEL: 2.0000 g | Freq: Four times a day (QID) | TRANSDERMAL | 1 refills | Status: DC

## 2017-03-11 MED ORDER — MELOXICAM 15 MG PO TABS: 15.0000 mg | ORAL_TABLET | Freq: Every day | ORAL | 1 refills | Status: AC

## 2017-03-11 NOTE — Assessment & Plan Note (Signed)
Patient states her fibromyalgia pain is stable. She is no longer taking gabapentin because it did not improve her symptoms. She was previously on lyrica, but is unable to afford this medication due to high copays. She states her pain is tolerable with massage, heat, and ice as needed. She declined trying a TCA or other medication due to concern for side effects.   Plan: -Will continue to monitor

## 2017-03-11 NOTE — Assessment & Plan Note (Addendum)
BP Readings from Last 3 Encounters:  03/11/17 110/70  02/16/17 135/72  01/09/17 (!) 118/91   Patient's blood pressure well controlled on current regimen of Losartan 100 mg and Amlodipine 10 mg.  Recent BMET with normal creatinine and potassium.   Plan: -Continue Losartan 100 mg -Continue Amlodipine 10 mg. -Lab work not indicated not indicated at this time

## 2017-03-11 NOTE — Progress Notes (Signed)
Medicine attending: Medical history, presenting problems, physical findings, and medications, reviewed with resident physician Dr Samantha LaCroce on the day of the patient visit and I concur with her evaluation and management plan. 

## 2017-03-11 NOTE — Assessment & Plan Note (Addendum)
Patient has been having mild, epigastric pain that is worse at night. She has only been taking omeprazole as needed. She also has been taking NSAIDs frequently. Denies melena, blood in stool, acid reflux. Abdominal exam without tenderness to palpation, bowel sounds normal. No focal findings,   Plan: -Advised patient to take omeprazole daily while taking NSAIDs for pain  -Strict return precautions if develops worsening pain, melena, blood in stool

## 2017-03-11 NOTE — Assessment & Plan Note (Signed)
Patient was seen in [facility] (12/2016) and ED (02/16/2017) for left sided back pain, as well as right sided lumbar back pain with radiculopathy. After her visit in December she tried physical therapy but was unable to continue due to pain and difficulty with mobility.  She visited the ED in beginning of February because she began having left sided, lumbar back pain that she describes "Knot-like" that is worse with movement and non-radiating. In the ED, she had a right hip xray which showed no acute osseous abnormality or significant degenerative changes. She also had a lumbar spine xray which showed: grade 1 anterolisthesis associated with moderate bilateral facet hypertrophy at L4-5; and chronic asymmetric right-sided degenerative disc disease with endplate change at U7-6. Her pain has not improved but is stable, and has not worsened. She was prescribed flexeril which also her pain. She has been taking ibuprofen, as well as using ice, massage, and heat. Her right sided symptoms are likely due to chronic degenerative changes in her lumbar spine with associated radiculopathy. Her left sided back pain seems like muscular spasm or injury. She has no red flag symptoms such as fever, chills, focal weakness, saddle anesthesia, or bladder/bowel incontinence. I also believe her symptoms and pain are exacerbated by her fibromyalgia, which she is currently not taking any recommendations for. She has an appointment with Neurosurgeon, Dr. Vertell Limber on 03/16/2017.  Plan -Will defer decision to get MRI to Dr. Vertell Limber and will f/u recs- patient will likely need one since her symptoms have not improved in 2 months and most recent MRI is in 2002.  -Prescribed Meloxicam 15 mg daily and advised patient to not take any additional NSAIDs, and to take with food  -Also given rx for voltaren gel- patient will not fill rx if she cannot afford  -Continue alternative therapies: heat, massage, ice

## 2017-03-11 NOTE — Patient Instructions (Addendum)
Ms. Rotundo,   It was nice seeing you today. I am sorry you are experiencing so much pain.  I have sent in Mobic (Meloxicam) 15 mg to your pharmacy. Please take this daily with food. Do not take additional ibuprofen, BC, or goody powders. You can take over the counter tylenol in addition to the Mobic if needed. Continue ice and heat.   I will follow up Dr. Melven Sartorius recommendations.   FOLLOW-UP INSTRUCTIONS When: 5-6 months For: blood pressure, routine follow up  What to bring: all medications

## 2017-03-11 NOTE — Progress Notes (Signed)
   CC: hypertension, fibromyalgia   HPI:  [name] is a 57 y.o. female with PMH as documented below presenting for follow up of hypertension and fibromyalgia. Please see encounter based charting for a detailed description of the patient's acute and chronic medical problems.   Past Medical History:  Diagnosis Date  . Anemia   . Anxiety   . Arthritis   . Atrophic endometrium 12/18/2016  . Fibromyalgia   . GERD (gastroesophageal reflux disease)   . Hypertension   . Jaundice    AGE 27 OR 21  . Mitral valve problem   . Wears glasses   . Wears partial dentures    UPPER   Review of Systems:   Review of Systems  Constitutional: Negative for chills and fever.  HENT: Negative.   Respiratory: Negative for shortness of breath.   Cardiovascular: Negative for chest pain and leg swelling.  Gastrointestinal: Negative for constipation, diarrhea, melena, nausea and vomiting.  Genitourinary: Negative for dysuria.  Musculoskeletal: Positive for back pain and joint pain.  Neurological: Positive for tingling. Negative for focal weakness.  Psychiatric/Behavioral: Negative.     Physical Exam:  Vitals:   03/11/17 1522  BP: 110/70  Pulse: 76  Temp: 98 F (36.7 C)  TempSrc: Oral  SpO2: 99%  Weight: 221 lb 6.4 oz (100.4 kg)  Height: 5\' 6"  (1.676 m)   Physical Exam  Constitutional: She is oriented to person, place, and time. She appears well-developed and well-nourished. No distress.  HENT:  Head: Normocephalic and atraumatic.  Mouth/Throat: Oropharynx is clear and moist.  Eyes: Conjunctivae are normal. No scleral icterus.  Neck: Normal range of motion. Neck supple.  Cardiovascular: Normal rate, regular rhythm and normal heart sounds.  Pulmonary/Chest: Effort normal and breath sounds normal.  Abdominal: Soft. Bowel sounds are normal. There is no tenderness.  Musculoskeletal: She exhibits no edema or deformity.       Right hip: Normal. She exhibits no tenderness and no bony  tenderness.       Lumbar back: She exhibits decreased range of motion (Decreased ROM with flexion). She exhibits no bony tenderness, no swelling and no edema.  Neurological: She is alert and oriented to person, place, and time. She has normal strength. No cranial nerve deficit or sensory deficit.  Skin: Skin is warm and dry.  Psychiatric: She has a normal mood and affect.    Assessment & Plan:   See Encounters Tab for problem based charting.  Patient discussed with Dr. Beryle Beams

## 2017-03-16 DIAGNOSIS — M5416 Radiculopathy, lumbar region: Secondary | ICD-10-CM | POA: Diagnosis not present

## 2017-03-16 DIAGNOSIS — I10 Essential (primary) hypertension: Secondary | ICD-10-CM | POA: Diagnosis not present

## 2017-03-16 DIAGNOSIS — Z6835 Body mass index (BMI) 35.0-35.9, adult: Secondary | ICD-10-CM | POA: Diagnosis not present

## 2017-03-16 DIAGNOSIS — M545 Low back pain: Secondary | ICD-10-CM | POA: Diagnosis not present

## 2017-03-30 ENCOUNTER — Other Ambulatory Visit: Payer: Self-pay | Admitting: Neurosurgery

## 2017-03-30 DIAGNOSIS — M5416 Radiculopathy, lumbar region: Secondary | ICD-10-CM

## 2017-04-02 ENCOUNTER — Ambulatory Visit
Admission: RE | Admit: 2017-04-02 | Discharge: 2017-04-02 | Disposition: A | Discharge status: Home / Self Care | Payer: Medicaid Other | Source: Ambulatory Visit | Attending: Neurosurgery | Admitting: Neurosurgery

## 2017-04-02 DIAGNOSIS — M5416 Radiculopathy, lumbar region: Secondary | ICD-10-CM

## 2017-05-20 ENCOUNTER — Ambulatory Visit: Payer: Medicaid Other | Attending: Neurosurgery | Admitting: Physical Therapy

## 2017-05-20 ENCOUNTER — Other Ambulatory Visit: Payer: Self-pay

## 2017-05-20 ENCOUNTER — Encounter: Payer: Self-pay | Admitting: Physical Therapy

## 2017-05-20 DIAGNOSIS — M25551 Pain in right hip: Secondary | ICD-10-CM | POA: Insufficient documentation

## 2017-05-20 DIAGNOSIS — R2689 Other abnormalities of gait and mobility: Secondary | ICD-10-CM | POA: Diagnosis present

## 2017-05-20 DIAGNOSIS — M62830 Muscle spasm of back: Secondary | ICD-10-CM | POA: Insufficient documentation

## 2017-05-20 DIAGNOSIS — M6281 Muscle weakness (generalized): Secondary | ICD-10-CM | POA: Diagnosis present

## 2017-05-20 DIAGNOSIS — G8929 Other chronic pain: Secondary | ICD-10-CM | POA: Diagnosis present

## 2017-05-20 DIAGNOSIS — M545 Low back pain: Secondary | ICD-10-CM | POA: Insufficient documentation

## 2017-05-21 NOTE — Therapy (Signed)
[place], [place], [address] Phone: [phone]   Fax:  [phone]  Physical Therapy Evaluation  Patient Details  Name: Sandra Church MRN: [id number] Date of Birth: Jun 04, 1960 Referring Provider: Dr Orma Render    Encounter Date: 05/20/2017  PT End of Session - 05/21/17 0909    Visit Number  1    Number of Visits  4    Date for PT Re-Evaluation  06/18/17    Authorization Type  [employer]     PT Start Time  1630    PT Stop Time  9470    PT Time Calculation (min)  45 min    Activity Tolerance  Patient tolerated treatment well    Behavior During Therapy  [facility] for tasks assessed/performed       Past Medical History:  Diagnosis Date  . Anemia   . Anxiety   . Arthritis   . Atrophic endometrium 12/18/2016  . Fibromyalgia   . GERD (gastroesophageal reflux disease)   . Hypertension   . Jaundice    AGE 57 OR 57  . Mitral valve problem   . Wears glasses   . Wears partial dentures    UPPER    Past Surgical History:  Procedure Laterality Date  . CLOSED MANIPULATION KNEE WITH STERIOD INJECTION  2011   rt  . COLONOSCOPY    . Cotton Osteotomy with Bone Graft Right 02/23/2014   Rt foot @ [facility]  . DIAGNOSTIC LAPAROSCOPY    . FOOT BONE EXCISION     both feet-spurs  . FOOT SURGERY     plantar fascitis  . HYSTEROSCOPY W/D&C N/A 12/18/2016   Procedure: DILATATION AND CURETTAGE /HYSTEROSCOPY;  Surgeon: Janyth Contes, MD;  Location: [facility];  Service: Gynecology;  Laterality: N/A;  . JOINT REPLACEMENT  2011   rt total knee replacement  . KNEE ARTHROPLASTY  2012   rt  . OPEN REDUCTION INTERNAL FIXATION (ORIF) METACARPAL Left 07/06/2012   Procedure: LEFT HAND EXPLORATION WOUNDS, OPEN REDUCTION INTERNAL FIXATION SMALL METACARPAL FRACTURE;  Surgeon: Tennis Must, MD;  Location: [place];  Service: Orthopedics;  Laterality: Left;  . REPAIR EXTENSOR TENDON Left  07/06/2012   Procedure: REPAIR EXTENSOR TENDON;  Surgeon: Tennis Must, MD;  Location: [place];  Service: Orthopedics;  Laterality: Left;  Left   . SHOULDER ARTHROSCOPY WITH DISTAL CLAVICLE RESECTION Left 01/24/2016   Procedure: SHOULDER ARTHROSCOPY WITH DISTAL CLAVICLE RESECTION;  Surgeon: [name], MD;  Location: [place];  Service: Orthopedics;  Laterality: Left;  . SHOULDER ARTHROSCOPY WITH ROTATOR CUFF REPAIR AND SUBACROMIAL DECOMPRESSION Left 01/24/2016   Procedure: LEFT SHOULDER ARTHROSCOPY ACROMIOPLASTY, CLAVICLECTOMY, ARTHROSCOPIC ROTATOR CUFF REPAIR;  Surgeon: [name], MD;  Location: [facility];  Service: Orthopedics;  Laterality: Left;  . SHOULDER SURGERY  2010   rt    There were no vitals filed for this visit.   Subjective Assessment - 05/20/17 1635    Subjective  Patient fell out in public 1 time since the last time she was in therapy. Patient states that she has slowed down and stays home more often. Patient is planning to schedule injections.  Pain goes mid-way from the back and goes down to the feet.     Limitations  Sitting;Standing;Walking    How long can you sit comfortably?  9-62 mins; certain chairs are worse     How long can you stand comfortably?  <  5 mins     How long can you walk comfortably?  painful at all times with walking     Diagnostic tests  MRI:     Patient Stated Goals  improve pain     Pain Score  8     Pain Location  Back    Pain Orientation  Right;Left;Mid;Lower    Pain Descriptors / Indicators  Burning    Pain Type  Chronic pain    Pain Radiating Towards  bilateral feet     Pain Onset  More than a month ago    Pain Frequency  Constant    Aggravating Factors   standing in one spot; changing position     Pain Relieving Factors  ice to the lower part of back and legs; tennis ball on feet          [facility] PT Assessment - 05/21/17 0001      Assessment   Medical Diagnosis  Low Back Pain      Referring Provider  Dr Orma Render     Onset Date/Surgical Date  12/25/16    Hand Dominance  Right    Next MD Visit  Not scheduled    Prior Therapy  yes       Precautions   Precautions  Other (comment)    Precaution Comments  Coaco butter allergy       Restrictions   Weight Bearing Restrictions  No      Balance Screen   Has the patient fallen in the past 6 months  Yes    How many times?  3    Has the patient had a decrease in activity level because of a fear of falling?   Yes    Is the patient reluctant to leave their home because of a fear of falling?   Yes      [place] residence    Living Arrangements  Alone    Type of [place] to enter    Entrance Stairs-Number of Steps  3    Entrance Stairs-Rails  Right;Left;Can reach both    [place]  Two level;Able to live on main level with bedroom/bathroom    Additional Comments  if going up the stairs more than 2 times she starts to feel pain       Prior Function   Level of Independence  Independent    Vocation  Unemployed    Leisure  gardening, music, puzzles      Cognition   Overall Cognitive Status  Within Functional Limits for tasks assessed    Attention  Focused    Focused Attention  Appears intact    Memory  Appears intact    Awareness  Appears intact    Problem Solving  Appears intact      Observation/Other Assessments   Observations  moving around in chair after 74mins       Sensation   Light Touch  Appears Intact    Stereognosis  Appears Intact    Hot/Cold  Appears Intact    Proprioception  Appears Intact      Coordination   Gross Motor Movements are Fluid and Coordinated  Yes    Fine Motor Movements are Fluid and Coordinated  Yes      AROM   Lumbar Flexion  75% limitwd with pain     Lumbar Extension  90%     Lumbar -  Right Side Bend  can not do it 2nd to pain     Lumbar - Right Rotation  pain into the buttock     Lumbar - Left  Rotation  turning to the left she can feel it into her buttock       Strength   Right Hip Flexion  3/5    Right Hip ABduction  4+/5    Left Hip Flexion  3+/5    Left Hip External Rotation  4/5    Left Hip Internal Rotation  4/5    Right Knee Flexion  4-/5    Right Knee Extension  4-/5    Left Knee Flexion  4-/5    Left Knee Extension  4-/5      Flexibility   Soft Tissue Assessment /Muscle Length  -- unable to assess       Palpation   Palpation comment  significant tenderness to palpation in bilateral paraspinals       Ambulation/Gait   Assistive device  Straight cane    Gait Comments  limite hipo gflexion; increased hip abduction; limited stride length                Objective measurements completed on examination: See above findings.      [facility] Adult PT Treatment/Exercise - 05/21/17 0001      Lumbar Exercises: Seated   Other Seated Lumbar Exercises  abdominal breathing x5 moderate cuing; psoterior pelvic tilt x10; seated march x10                PT Short Term Goals - 05/21/17 0913      PT SHORT TERM GOAL #1   Title  Patient will increase lumbar flexion by 25%     Baseline  limited 75% with severe pain     Time  4    Period  Weeks    Status  New      PT SHORT TERM GOAL #2   Title  Patient will improve gross bilateral lower extremity strength to 4/5     Baseline  bilateral knee extension and flexion 3-/5 bilateral hip flexion 3+/5     Time  4    Period  Weeks    Status  New    Target Date  06/18/17      PT SHORT TERM GOAL #3   Title  Patient will report pain < 4/10 with standing     Baseline  8/10 consistently     Time  4    Period  Weeks    Status  New    Target Date  06/18/17        PT Long Term Goals - 02/03/17 1520      PT LONG TERM GOAL #1   Title  independent with advanced HEP    Baseline  no HEP    Time  8    Period  Weeks    Status  On-going      PT LONG TERM GOAL #2   Title  pt will be able to amb > 15 min without  increase in pain for improved function    Baseline  < 5 min    Time  8    Period  Weeks    Status  On-going      PT LONG TERM GOAL #3   Title  report pain < 3/10 for improved function and mobiility    Baseline  pain 6-9/10    Time  8  Period  Weeks    Status  On-going      PT LONG TERM GOAL #4   Title  amb > 300' without significant gait deviations for improved function and mobility    Baseline  100' with antalgic gait and limited weight bearing on Rt    Period  Weeks    Status  On-going      PT LONG TERM GOAL #5   Title  demonstrate at least 4/5 Rt hip strength for improved function    Baseline  flexion 3/5; extension 1/5; external rotation 3/5; abduction 3-/5    Period  Weeks    Status  On-going             Plan - 05/20/17 1650    Clinical Impression Statement  Patient is a 57 year old female with significant lower back pain. She has pain with all movements. She has limited hip mobility and strength. She can not lie down on her back, At this time she has a significant fear of movement. She feels like everything is causing "pulling" in her back. She is having difficulty standing to perfrom her ADL's. She would benefit from skilled therapy to improve her ability to move, reduce back pain, and improve strength  of her core and leower extremitys. She was seen for a moderate complexity evalaution 2nd to incrasing lower back pain and multiple co-morbiditys.     History and Personal Factors relevant to plan of care:  coco butter allergy; anxiety, fibromylagia     Clinical Presentation  Evolving    Clinical Presentation due to:  pain that is increasing to the point were she can not perfrom any ADL's     Clinical Decision Making  Moderate    Rehab Potential  Good    PT Frequency  1x / week    PT Duration  4 weeks    PT Treatment/Interventions  ADLs/Self Care Home Management;Cryotherapy;Electrical Stimulation;Iontophoresis 4mg /ml Dexamethasone;Moist Heat;Therapeutic  exercise;Therapeutic activities;Functional mobility training;Stair training;Gait training;Ultrasound;Balance training;Neuromuscular re-education;Patient/family education;Manual techniques;Dry needling    PT Next Visit Plan  Patient can not lie down on her back. Work towrds being able o lie supine. Has trouble lying on her shoulders 2nd to pain; consider upper extremity core strengthening. triy manual therapy; consider ball Church for strething;     PT Home Exercise Plan  abdominal breathing; seated PPT; seated march     Consulted and Agree with Plan of Care  Patient       Patient will benefit from skilled therapeutic intervention in order to improve the following deficits and impairments:  Abnormal gait, Pain, Increased fascial restricitons, Increased muscle spasms, Difficulty walking, Decreased balance, Decreased strength, Impaired flexibility, Decreased activity tolerance  Visit Diagnosis: Chronic bilateral low back pain without sciatica  Muscle spasm of back  Muscle weakness (generalized)  Other abnormalities of gait and mobility     Problem List Patient Active Problem List   Diagnosis Date Noted  . Back pain with right-sided radiculopathy 03/11/2017  . Piriformis syndrome of right side 01/09/2017  . Postmenopausal bleeding 12/31/2016  . Atrophic endometrium 12/18/2016  . Left flank pain 10/23/2016  . Hypertension 10/08/2016  . Fibromyalgia 10/08/2016  . Osteoarthritis 10/08/2016  . GERD (gastroesophageal reflux disease) 10/08/2016  . Anemia 10/08/2016  . Screening for cholesterol level 10/08/2016  . Preventative health care 10/08/2016  . Screening for diabetes mellitus (DM) 10/08/2016  . Status post right foot surgery 04/12/2014  . 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 Pronation deformity of ankle, acquired  02/01/2014  . Tenosynovitis of right ankle 02/01/2014    Carney Living PT DPT  05/21/2017, 10:19 AM  Cooper Render SPT  05/21/2017  During this treatment session, the  therapist was present, participating in and directing the treatment.  [place], [place], [address] Phone: [phone]   Fax:  [phone]  Name: Sandra Church MRN: [id number] Date of Birth: Sep 20, 1960

## 2017-05-22 ENCOUNTER — Ambulatory Visit: Payer: Medicaid Other | Admitting: Physical Therapy

## 2017-06-01 ENCOUNTER — Ambulatory Visit: Payer: Medicaid Other | Admitting: Physical Therapy

## 2017-06-01 ENCOUNTER — Encounter: Payer: Self-pay | Admitting: Physical Therapy

## 2017-06-01 DIAGNOSIS — M545 Low back pain: Principal | ICD-10-CM

## 2017-06-01 DIAGNOSIS — M6281 Muscle weakness (generalized): Secondary | ICD-10-CM

## 2017-06-01 DIAGNOSIS — R2689 Other abnormalities of gait and mobility: Secondary | ICD-10-CM

## 2017-06-01 DIAGNOSIS — G8929 Other chronic pain: Secondary | ICD-10-CM

## 2017-06-01 DIAGNOSIS — M25551 Pain in right hip: Secondary | ICD-10-CM

## 2017-06-01 DIAGNOSIS — M62830 Muscle spasm of back: Secondary | ICD-10-CM

## 2017-06-01 NOTE — Therapy (Signed)
[place], [place], [address] Phone: [phone]   Fax:  [phone]  Physical Therapy Treatment  Patient Details  Name: Sandra Church MRN: [id number] Date of Birth: Oct 31, 1960 Referring Provider: Dr Orma Render    Encounter Date: 06/01/2017  PT End of Session - 06/01/17 1113    Visit Number  2    Number of Visits  4    Date for PT Re-Evaluation  06/18/17    Authorization Type  [employer]iciad     PT Start Time  1102    PT Stop Time  1144    PT Time Calculation (min)  42 min    Activity Tolerance  Patient tolerated treatment well    Behavior During Therapy  [facility] for tasks assessed/performed       Past Medical History:  Diagnosis Date  . Anemia   . Anxiety   . Arthritis   . Atrophic endometrium 12/18/2016  . Fibromyalgia   . GERD (gastroesophageal reflux disease)   . Hypertension   . Jaundice    AGE 55 OR 21  . Mitral valve problem   . Wears glasses   . Wears partial dentures    UPPER    Past Surgical History:  Procedure Laterality Date  . CLOSED MANIPULATION KNEE WITH STERIOD INJECTION  2011   rt  . COLONOSCOPY    . Cotton Osteotomy with Bone Graft Right 02/23/2014   Rt foot @ [facility]  . DIAGNOSTIC LAPAROSCOPY    . FOOT BONE EXCISION     both feet-spurs  . FOOT SURGERY     plantar fascitis  . HYSTEROSCOPY W/D&C N/A 12/18/2016   Procedure: DILATATION AND CURETTAGE /HYSTEROSCOPY;  Surgeon: Janyth Contes, MD;  Location: [facility];  Service: Gynecology;  Laterality: N/A;  . JOINT REPLACEMENT  2011   rt total knee replacement  . KNEE ARTHROPLASTY  2012   rt  . OPEN REDUCTION INTERNAL FIXATION (ORIF) METACARPAL Left 07/06/2012   Procedure: LEFT HAND EXPLORATION WOUNDS, OPEN REDUCTION INTERNAL FIXATION SMALL METACARPAL FRACTURE;  Surgeon: Tennis Must, MD;  Location: [facility];  Service: Orthopedics;  Laterality: Left;  . REPAIR EXTENSOR TENDON Left  07/06/2012   Procedure: REPAIR EXTENSOR TENDON;  Surgeon: Tennis Must, MD;  Location: [facility];  Service: Orthopedics;  Laterality: Left;  Left   . SHOULDER ARTHROSCOPY WITH DISTAL CLAVICLE RESECTION Left 01/24/2016   Procedure: SHOULDER ARTHROSCOPY WITH DISTAL CLAVICLE RESECTION;  Surgeon: [name], MD;  Location: [facility];  Service: Orthopedics;  Laterality: Left;  . SHOULDER ARTHROSCOPY WITH ROTATOR CUFF REPAIR AND SUBACROMIAL DECOMPRESSION Left 01/24/2016   Procedure: LEFT SHOULDER ARTHROSCOPY ACROMIOPLASTY, CLAVICLECTOMY, ARTHROSCOPIC ROTATOR CUFF REPAIR;  Surgeon: [name], MD;  Location: [facility];  Service: Orthopedics;  Laterality: Left;  . SHOULDER SURGERY  2010   rt    There were no vitals filed for this visit.  Subjective Assessment - 06/01/17 1106    Subjective  Patyient reports she is having less pain but it is still hih and it jumps up when she tries to lift things. She tried her exercises but some of them were too tough for her.     Limitations  Sitting;Standing;Walking    How long can you sit comfortably?  2-70 mins; certain chairs are worse     How long can you stand comfortably?  <5 mins     How long can you walk comfortably?  painful at all times with walking     Currently in Pain?  Yes    Pain Score  7     Pain Location  Back    Pain Orientation  Right;Mid;Left    Pain Descriptors / Indicators  Burning    Pain Type  Chronic pain    Pain Onset  More than a month ago    Pain Frequency  Constant    Aggravating Factors   standing in one spot     Pain Relieving Factors  ice to the lower back                        [facility] Adult PT Treatment/Exercise - 06/01/17 0001      Self-Care   Other Self-Care Comments   reviewed the improtanc eof proper posture and increasing the amount she is moving.       Lumbar Exercises: Stretches   Active Hamstring Stretch  3 reps;20 seconds      Lumbar  Exercises: Seated   Other Seated Lumbar Exercises  seated ball roll out 3x5 forward 2x5 lateral; seated scap retraction 2x10;      Other Seated Lumbar Exercises  ball press 2x10; abdominal breathing x10  min cuing;              PT Education - 06/01/17 1108    Education provided  Yes    Education Details  reviewed HEP; improtance of movement.     Person(s) Educated  Patient    Methods  Explanation    Comprehension  Verbalized understanding;Returned demonstration;Verbal cues required;Tactile cues required       PT Short Term Goals - 05/21/17 0913      PT SHORT TERM GOAL #1   Title  Patient will increase lumbar flexion by 25%     Baseline  limited 75% with severe pain     Time  4    Period  Weeks    Status  New      PT SHORT TERM GOAL #2   Title  Patient will improve gross bilateral lower extremity strength to 4/5     Baseline  bilateral knee extension and flexion 3-/5 bilateral hip flexion 3+/5     Time  4    Period  Weeks    Status  New    Target Date  06/18/17      PT SHORT TERM GOAL #3   Title  Patient will report pain < 4/10 with standing     Baseline  8/10 consistently     Time  4    Period  Weeks    Status  New    Target Date  06/18/17        PT Long Term Goals - 02/03/17 1520      PT LONG TERM GOAL #1   Title  independent with advanced HEP    Baseline  no HEP    Time  8    Period  Weeks    Status  On-going      PT LONG TERM GOAL #2   Title  pt will be able to amb > 15 min without increase in pain for improved function    Baseline  < 5 min    Time  8    Period  Weeks    Status  On-going      PT LONG TERM GOAL #3   Title  report pain < 3/10 for improved function and  mobiility    Baseline  pain 6-9/10    Time  8    Period  Weeks    Status  On-going      PT LONG TERM GOAL #4   Title  amb > 300' without significant gait deviations for improved function and mobility    Baseline  100' with antalgic gait and limited weight bearing on Rt     Period  Weeks    Status  On-going      PT LONG TERM GOAL #5   Title  demonstrate at least 4/5 Rt hip strength for improved function    Baseline  flexion 3/5; extension 1/5; external rotation 3/5; abduction 3-/5    Period  Weeks    Status  On-going            Plan - 06/01/17 1121    Clinical Impression Statement  Patient tolerated treatment better today. She was able to perfrom seated ball exercises without a significant increase in pain. She was given glut squeezes and scap retraction for her home program to improve posture. Patient was also given a tennis ball for trigger point release. She reported improved pain.     Clinical Presentation  Evolving    Clinical Decision Making  Moderate    Rehab Potential  Good    PT Frequency  1x / week    PT Duration  4 weeks    PT Treatment/Interventions  ADLs/Self Care Home Management;Cryotherapy;Electrical Stimulation;Iontophoresis 4mg /ml Dexamethasone;Moist Heat;Therapeutic exercise;Therapeutic activities;Functional mobility training;Stair training;Gait training;Ultrasound;Balance training;Neuromuscular re-education;Patient/family education;Manual techniques;Dry needling    PT Next Visit Plan  Patient can not lie down on her back. Work towrds being able o lie supine. Has trouble lying on her shoulders 2nd to pain; consider upper extremity core strengthening. triy manual therapy; consider ball roll for strething;     PT Home Exercise Plan  seated glut squeeze; seated pelvic tilt     Consulted and Agree with Plan of Care  Patient       Patient will benefit from skilled therapeutic intervention in order to improve the following deficits and impairments:  Abnormal gait, Pain, Increased fascial restricitons, Increased muscle spasms, Difficulty walking, Decreased balance, Decreased strength, Impaired flexibility, Decreased activity tolerance  Visit Diagnosis: Chronic bilateral low back pain without sciatica  Muscle spasm of back  Muscle weakness  (generalized)  Other abnormalities of gait and mobility  Pain in right hip     Problem List Patient Active Problem List   Diagnosis Date Noted  . Back pain with right-sided radiculopathy 03/11/2017  . Piriformis syndrome of right side 01/09/2017  . Postmenopausal bleeding 12/31/2016  . Atrophic endometrium 12/18/2016  . Left flank pain 10/23/2016  . Hypertension 10/08/2016  . Fibromyalgia 10/08/2016  . Osteoarthritis 10/08/2016  . GERD (gastroesophageal reflux disease) 10/08/2016  . Anemia 10/08/2016  . Screening for cholesterol level 10/08/2016  . Preventative health care 10/08/2016  . Screening for diabetes mellitus (DM) 10/08/2016  . Status post right foot surgery 04/12/2014  . 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 Pronation deformity of ankle, acquired 02/01/2014  . Tenosynovitis of right ankle 02/01/2014    Carney Living PT DPT  06/01/2017, 4:40 PM  [place], [place], [address] Phone: [phone]   Fax:  [phone]  Name: Sandra Church MRN: [id number] Date of Birth: 05-03-1960

## 2017-06-10 ENCOUNTER — Encounter: Payer: Self-pay | Admitting: Physical Therapy

## 2017-06-10 ENCOUNTER — Ambulatory Visit: Payer: Medicaid Other | Admitting: Physical Therapy

## 2017-06-10 DIAGNOSIS — M545 Low back pain: Secondary | ICD-10-CM | POA: Diagnosis not present

## 2017-06-10 DIAGNOSIS — R2689 Other abnormalities of gait and mobility: Secondary | ICD-10-CM

## 2017-06-10 DIAGNOSIS — M62830 Muscle spasm of back: Secondary | ICD-10-CM

## 2017-06-10 DIAGNOSIS — M6281 Muscle weakness (generalized): Secondary | ICD-10-CM

## 2017-06-10 DIAGNOSIS — G8929 Other chronic pain: Secondary | ICD-10-CM

## 2017-06-10 DIAGNOSIS — M25551 Pain in right hip: Secondary | ICD-10-CM

## 2017-06-11 NOTE — Therapy (Signed)
[place], [place], [address] Phone: [phone]   Fax:  [phone]  Physical Therapy Treatment  Patient Details  Name: Sandra Church MRN: [id number] Date of Birth: [birth year] Referring Provider: Dr Orma Render    Encounter Date: 06/10/2017  PT End of Session - 06/10/17 1028    Visit Number  3    Number of Visits  4    Date for PT Re-Evaluation  06/18/17    Authorization Type  [employer]     PT Start Time  1026 Not checked in by the front     PT Stop Time  1110    PT Time Calculation (min)  44 min    Activity Tolerance  Patient tolerated treatment well    Behavior During Therapy  [facility] for tasks assessed/performed       Past Medical History:  Diagnosis Date  . Anemia   . Anxiety   . Arthritis   . Atrophic endometrium 12/18/2016  . Fibromyalgia   . GERD (gastroesophageal reflux disease)   . Hypertension   . Jaundice    AGE 57 OR 21  . Mitral valve problem   . Wears glasses   . Wears partial dentures    UPPER    Past Surgical History:  Procedure Laterality Date  . CLOSED MANIPULATION KNEE WITH STERIOD INJECTION  2011   rt  . COLONOSCOPY    . Cotton Osteotomy with Bone Graft Right 02/23/2014   Rt foot @ [facility]  . DIAGNOSTIC LAPAROSCOPY    . FOOT BONE EXCISION     both feet-spurs  . FOOT SURGERY     plantar fascitis  . HYSTEROSCOPY W/D&C N/A 12/18/2016   Procedure: DILATATION AND CURETTAGE /HYSTEROSCOPY;  Surgeon: Janyth Contes, MD;  Location: [place];  Service: Gynecology;  Laterality: N/A;  . JOINT REPLACEMENT  2011   rt total knee replacement  . KNEE ARTHROPLASTY  2012   rt  . OPEN REDUCTION INTERNAL FIXATION (ORIF) METACARPAL Left 07/06/2012   Procedure: LEFT HAND EXPLORATION WOUNDS, OPEN REDUCTION INTERNAL FIXATION SMALL METACARPAL FRACTURE;  Surgeon: Tennis Must, MD;  Location: [place];  Service: Orthopedics;  Laterality: Left;  .  REPAIR EXTENSOR TENDON Left 07/06/2012   Procedure: REPAIR EXTENSOR TENDON;  Surgeon: Tennis Must, MD;  Location: [place];  Service: Orthopedics;  Laterality: Left;  Left   . SHOULDER ARTHROSCOPY WITH DISTAL CLAVICLE RESECTION Left 01/24/2016   Procedure: SHOULDER ARTHROSCOPY WITH DISTAL CLAVICLE RESECTION;  Surgeon: [name], MD;  Location: [place];  Service: Orthopedics;  Laterality: Left;  . SHOULDER ARTHROSCOPY WITH ROTATOR CUFF REPAIR AND SUBACROMIAL DECOMPRESSION Left 01/24/2016   Procedure: LEFT SHOULDER ARTHROSCOPY ACROMIOPLASTY, CLAVICLECTOMY, ARTHROSCOPIC ROTATOR CUFF REPAIR;  Surgeon: [name], MD;  Location: [place];  Service: Orthopedics;  Laterality: Left;  . SHOULDER SURGERY  2010   rt    There were no vitals filed for this visit.  Subjective Assessment - 06/10/17 1029    Subjective  Patient reports she has been moving more. She has been trying to walk and she has been doing her exercises. Her pain level is stillreported as high but she reported she is not having much pain today.     Limitations  Sitting;Standing;Walking    How long can you sit comfortably?  0-62 mins; certain chairs are worse     How long can you walk comfortably?  painful at all  times with walking     Diagnostic tests  MRI:     Patient Stated Goals  improve pain     Currently in Pain?  Yes    Pain Score  7  but reports it is doing much better    Pain Location  Back    Pain Orientation  Right;Left;Mid    Pain Descriptors / Indicators  Burning    Pain Type  Chronic pain    Pain Onset  More than a month ago    Pain Frequency  Constant    Aggravating Factors   standing in one spot     Pain Relieving Factors  ice to the lower back     Multiple Pain Sites  No                       [facility] Adult PT Treatment/Exercise - 06/11/17 0001      Self-Care   Other Self-Care Comments   reviewed sittin posture       Lumbar Exercises:  Stretches   Active Hamstring Stretch  3 reps;20 seconds      Lumbar Exercises: Seated   Other Seated Lumbar Exercises  seated ball roll out 3x5 forward 2x5 lateral; seated scap retraction 2x10;  scap retraction x10 x8 on second set patient reported significant hip pain. Exercise stopped. seated ball squeeze 2x10; seated hip abduction yellow 2x10 yellow band limited movement     Other Seated Lumbar Exercises  ball press 2x10; abdominal breathing x10  min cuing;       Modalities   Modalities  Cryotherapy      Cryotherapy   Number Minutes Cryotherapy  10 Minutes    Cryotherapy Location  Hip    Type of Cryotherapy  Ice pack             PT Education - 06/10/17 1644    Education provided  Yes    Education Details  improtance of building strength     Person(s) Educated  Patient    Methods  Explanation;Demonstration;Tactile cues;Verbal cues;Handout    Comprehension  Verbalized understanding;Returned demonstration;Verbal cues required;Tactile cues required;Need further instruction       PT Short Term Goals - 06/11/17 0830      PT SHORT TERM GOAL #1   Title  Patient will increase lumbar flexion by 25%     Baseline  limited 75% with severe pain     Time  4    Period  Weeks    Status  On-going      PT SHORT TERM GOAL #2   Title  Patient will improve gross bilateral lower extremity strength to 4/5     Baseline  bilateral knee extension and flexion 3-/5 bilateral hip flexion 3+/5     Time  4    Period  Weeks    Status  On-going      PT SHORT TERM GOAL #3   Title  Patient will report pain < 4/10 with standing     Baseline  8/10 consistently     Time  4    Period  Weeks    Status  On-going        PT Long Term Goals - 02/03/17 1520      PT LONG TERM GOAL #1   Title  independent with advanced HEP    Baseline  no HEP    Time  8    Period  Weeks    Status  On-going  PT LONG TERM GOAL #2   Title  pt will be able to amb > 15 min without increase in pain for improved  function    Baseline  < 5 min    Time  8    Period  Weeks    Status  On-going      PT LONG TERM GOAL #3   Title  report pain < 3/10 for improved function and mobiility    Baseline  pain 6-9/10    Time  8    Period  Weeks    Status  On-going      PT LONG TERM GOAL #4   Title  amb > 300' without significant gait deviations for improved function and mobility    Baseline  100' with antalgic gait and limited weight bearing on Rt    Period  Weeks    Status  On-going      PT LONG TERM GOAL #5   Title  demonstrate at least 4/5 Rt hip strength for improved function    Baseline  flexion 3/5; extension 1/5; external rotation 3/5; abduction 3-/5    Period  Weeks    Status  On-going            Plan - 06/11/17 0816    Clinical Impression Statement  Patient was tolerating treatment well but on the last exercises she began to report sharp pain into her hip. She was doing a scap retraction when she began to report significant back pain. She was put on ice and reported improvement after the ice. She was encouraged to conitnue with her strethcing.. Treatment started late 2nd to front desk not checking her in.     Clinical Presentation  Evolving    Clinical Decision Making  Moderate    Rehab Potential  Good    PT Frequency  1x / week    PT Duration  4 weeks    PT Treatment/Interventions  ADLs/Self Care Home Management;Cryotherapy;Electrical Stimulation;Iontophoresis 4mg /ml Dexamethasone;Moist Heat;Therapeutic exercise;Therapeutic activities;Functional mobility training;Stair training;Gait training;Ultrasound;Balance training;Neuromuscular re-education;Patient/family education;Manual techniques;Dry needling    PT Next Visit Plan  Patient still can not lie on her back. tried scap retraction and patient began to have severe hip pain. continue with current exercises.     PT Home Exercise Plan  seated glut squeeze; seated pelvic tilt     Consulted and Agree with Plan of Care  Patient     .    Patient will benefit from skilled therapeutic intervention in order to improve the following deficits and impairments:  Abnormal gait, Pain, Increased fascial restricitons, Increased muscle spasms, Difficulty walking, Decreased balance, Decreased strength, Impaired flexibility, Decreased activity tolerance  Visit Diagnosis: Chronic bilateral low back pain without sciatica  Muscle spasm of back  Muscle weakness (generalized)  Other abnormalities of gait and mobility  Pain in right hip     Problem List Patient Active Problem List   Diagnosis Date Noted  . Back pain with right-sided radiculopathy 03/11/2017  . Piriformis syndrome of right side 01/09/2017  . Postmenopausal bleeding 12/31/2016  . Atrophic endometrium 12/18/2016  . Left flank pain 10/23/2016  . Hypertension 10/08/2016  . Fibromyalgia 10/08/2016  . Osteoarthritis 10/08/2016  . GERD (gastroesophageal reflux disease) 10/08/2016  . Anemia 10/08/2016  . Screening for cholesterol level 10/08/2016  . Preventative health care 10/08/2016  . Screening for diabetes mellitus (DM) 10/08/2016  . Status post right foot surgery 04/12/2014  . 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 Pronation deformity of ankle, acquired 02/01/2014  .  Tenosynovitis of right ankle 02/01/2014    Carney Living 06/11/2017, 8:31 AM  [facility] [address] [place], [place], [address] Phone: [phone]   Fax:  [phone]  Name: LAMEKA DISLA MRN: [id number] Date of Birth: 07-02-1960

## 2017-06-17 ENCOUNTER — Ambulatory Visit: Payer: Medicaid Other | Admitting: Physical Therapy

## 2017-06-25 ENCOUNTER — Encounter

## 2017-06-25 ENCOUNTER — Ambulatory Visit: Payer: Medicaid Other | Attending: Neurosurgery | Admitting: Physical Therapy

## 2017-06-25 DIAGNOSIS — R2689 Other abnormalities of gait and mobility: Secondary | ICD-10-CM

## 2017-06-25 DIAGNOSIS — M62830 Muscle spasm of back: Secondary | ICD-10-CM | POA: Diagnosis present

## 2017-06-25 DIAGNOSIS — M6281 Muscle weakness (generalized): Secondary | ICD-10-CM | POA: Diagnosis present

## 2017-06-25 DIAGNOSIS — M545 Low back pain, unspecified: Secondary | ICD-10-CM

## 2017-06-25 DIAGNOSIS — G8929 Other chronic pain: Secondary | ICD-10-CM

## 2017-06-26 ENCOUNTER — Encounter: Payer: Self-pay | Admitting: Physical Therapy

## 2017-06-26 NOTE — Therapy (Signed)
[place], [place], [address] Phone: [phone]   Fax:  [phone]  Physical Therapy Treatment  Patient Details  Name: Sandra Church MRN: [id number] Date of Birth: 10-08-1960 Referring Provider: Dr Orma Render    Encounter Date: 06/25/2017  PT End of Session - 06/26/17 0819    Visit Number  4    Number of Visits  12    Date for PT Re-Evaluation  06/18/17    Authorization Type  [employer]  Will re-submit     PT Start Time  1330    PT Stop Time  1413    PT Time Calculation (min)  43 min    Activity Tolerance  Patient tolerated treatment well    Behavior During Therapy  [facility] for tasks assessed/performed       Past Medical History:  Diagnosis Date  . Anemia   . Anxiety   . Arthritis   . Atrophic endometrium 12/18/2016  . Fibromyalgia   . GERD (gastroesophageal reflux disease)   . Hypertension   . Jaundice    AGE 57 OR 57  . Mitral valve problem   . Wears glasses   . Wears partial dentures    UPPER    Past Surgical History:  Procedure Laterality Date  . CLOSED MANIPULATION KNEE WITH STERIOD INJECTION  2011   rt  . COLONOSCOPY    . Cotton Osteotomy with Bone Graft Right 02/23/2014   Rt foot @ [facility]  . DIAGNOSTIC LAPAROSCOPY    . FOOT BONE EXCISION     both feet-spurs  . FOOT SURGERY     plantar fascitis  . HYSTEROSCOPY W/D&C N/A 12/18/2016   Procedure: DILATATION AND CURETTAGE /HYSTEROSCOPY;  Surgeon: Janyth Contes, MD;  Location: [place];  Service: Gynecology;  Laterality: N/A;  . JOINT REPLACEMENT  2011   rt total knee replacement  . KNEE ARTHROPLASTY  2012   rt  . OPEN REDUCTION INTERNAL FIXATION (ORIF) METACARPAL Left 07/06/2012   Procedure: LEFT HAND EXPLORATION WOUNDS, OPEN REDUCTION INTERNAL FIXATION SMALL METACARPAL FRACTURE;  Surgeon: Tennis Must, MD;  Location: [place];  Service: Orthopedics;  Laterality: Left;  . REPAIR EXTENSOR  TENDON Left 07/06/2012   Procedure: REPAIR EXTENSOR TENDON;  Surgeon: Tennis Must, MD;  Location: [place];  Service: Orthopedics;  Laterality: Left;  Left   . SHOULDER ARTHROSCOPY WITH DISTAL CLAVICLE RESECTION Left 01/24/2016   Procedure: SHOULDER ARTHROSCOPY WITH DISTAL CLAVICLE RESECTION;  Surgeon: [name], MD;  Location: [place];  Service: Orthopedics;  Laterality: Left;  . SHOULDER ARTHROSCOPY WITH ROTATOR CUFF REPAIR AND SUBACROMIAL DECOMPRESSION Left 01/24/2016   Procedure: LEFT SHOULDER ARTHROSCOPY ACROMIOPLASTY, CLAVICLECTOMY, ARTHROSCOPIC ROTATOR CUFF REPAIR;  Surgeon: [name], MD;  Location: [place];  Service: Orthopedics;  Laterality: Left;  . SHOULDER SURGERY  2010   rt    There were no vitals filed for this visit.  Subjective Assessment - 06/25/17 1334    Subjective  Patient feels like she is making progress. She is able to stand longer and she is able to walk a bit longer. She is having stinging in her back side. She is having trouble sitting for too long at church and in certain cars.     Limitations  Sitting;Standing;Walking    How long can you stand comfortably?  <5 mins     How long can you walk comfortably?  painful at all times  with walking     Diagnostic tests  MRI:     Patient Stated Goals  improve pain     Pain Score  7     Pain Location  Back    Pain Orientation  Left;Right    Pain Descriptors / Indicators  Aching    Pain Type  Chronic pain    Pain Radiating Towards  into the left buttock not radiating into her foot as much     Pain Onset  More than a month ago    Pain Frequency  Constant    Aggravating Factors   standing in one spot     Pain Relieving Factors  ice to the lower back          [facility] PT Assessment - 06/26/17 0001      Strength   Overall Strength Comments  good core contraction in sitting with min cuing                    [facility] Adult PT Treatment/Exercise -  06/26/17 0001      Lumbar Exercises: Stretches   Active Hamstring Stretch  3 reps;20 seconds      Lumbar Exercises: Seated   Other Seated Lumbar Exercises  seated ball squeeze with abdominal breathing 2x10; scap retraction 3x5 yellow    Other Seated Lumbar Exercises  ball press 2x10; abdominal breathing x10  min cuing; bilateral ER with postural cuing 3x5 yellow              PT Education - 06/25/17 1339    Education provided  Yes    Education Details  reviewed HEP, symptom mangement, improtance of improving strength    Person(s) Educated  Patient    Methods  Explanation;Demonstration;Tactile cues;Verbal cues    Comprehension  Verbalized understanding;Returned demonstration;Verbal cues required;Tactile cues required       PT Short Term Goals - 06/25/17 1344      PT SHORT TERM GOAL #1   Title  Patient will increase lumbar flexion by 25%     Baseline  25% improvement     Time  4    Period  Weeks    Status  Achieved      PT SHORT TERM GOAL #2   Title  Patient will improve gross bilateral lower extremity strength to 4/5     Baseline  4/5 gross strength     Time  4    Period  Weeks    Status  Achieved      PT SHORT TERM GOAL #3   Title  Patient will report pain < 4/10 with standing     Baseline  continues to have 7/10 pain     Time  4    Period  Weeks    Status  On-going        PT Long Term Goals - 06/26/17 6213      PT LONG TERM GOAL #1   Title  independent with advanced HEP    Baseline  Independent with basic HEP but is yet to do standing exercise or higher level exercises     Time  4    Period  Weeks    Status  New    Target Date  07/24/17      PT LONG TERM GOAL #2   Title  pt will be able to amb > 15 min without increase in pain for improved function    Baseline  10 min before needing a seted  rest break     Time  4    Period  Weeks    Status  New    Target Date  07/24/17      PT LONG TERM GOAL #3   Title  Patient will demostrate a 25% limitation in  lumbar flexion in order to put her shoes on     Time  4    Period  Weeks    Status  New    Target Date  07/24/17            Plan - 06/26/17 0820    Clinical Impression Statement  Patient has made good progress. her lower extrmeity strength ahs improved from 3/5 hip flexion R and 3+/5 right to 4/5 bilateral. Her bilateral hip abduction has improved to 4/5 as well. her lumbar spine flexion has improved from75% limited with extrem pain to 50% limited with minor pain. She also was unable to extend. She improved her extension 25%. Functionally she reports abetter ability to stand at her sink and wash her dishes. She is able to stand between 20-30 minutes befire she needs to sit. She has started shopping again. she needs to take rest breaks but that is improving as well. Her pain level is a 7/10 today but she reports the high pain levels are less frequent and she no longer has 10/10 pain. She would benefit from skilled therapy to continue to progress her exercises. She still can not lie flat. She would also benefit from manual therapy to reduce spasming. Therapy will likley add in standing exercise over the next few visits.     Clinical Presentation  Evolving    Clinical Decision Making  Moderate    Rehab Potential  Good    PT Frequency  2x / week    PT Duration  4 weeks    PT Treatment/Interventions  ADLs/Self Care Home Management;Cryotherapy;Electrical Stimulation;Iontophoresis 4mg /ml Dexamethasone;Moist Heat;Therapeutic exercise;Therapeutic activities;Functional mobility training;Stair training;Gait training;Ultrasound;Balance training;Neuromuscular re-education;Patient/family education;Manual techniques;Dry needling    PT Next Visit Plan  Patient still can not lie on her back. tried scap retraction and patient began to have severe hip pain. continue with current exercises.     PT Home Exercise Plan  seated glut squeeze; seated pelvic tilt     Consulted and Agree with Plan of Care  Patient        Patient will benefit from skilled therapeutic intervention in order to improve the following deficits and impairments:  Abnormal gait, Pain, Increased fascial restricitons, Increased muscle spasms, Difficulty walking, Decreased balance, Decreased strength, Impaired flexibility, Decreased activity tolerance  Visit Diagnosis: Chronic bilateral low back pain without sciatica  Muscle spasm of back  Muscle weakness (generalized)  Other abnormalities of gait and mobility     Problem List Patient Active Problem List   Diagnosis Date Noted  . Back pain with right-sided radiculopathy 03/11/2017  . Piriformis syndrome of right side 01/09/2017  . Postmenopausal bleeding 12/31/2016  . Atrophic endometrium 12/18/2016  . Left flank pain 10/23/2016  . Hypertension 10/08/2016  . Fibromyalgia 10/08/2016  . Osteoarthritis 10/08/2016  . GERD (gastroesophageal reflux disease) 10/08/2016  . Anemia 10/08/2016  . Screening for cholesterol level 10/08/2016  . Preventative health care 10/08/2016  . Screening for diabetes mellitus (DM) 10/08/2016  . Status post right foot surgery 04/12/2014  . 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 Pronation deformity of ankle, acquired 02/01/2014  . Tenosynovitis of right ankle 02/01/2014    Carney Living  PT DPT  06/26/2017, 8:32  AM  [facility] [address] [place], [place], [address] Phone: [phone]   Fax:  [phone]  Name: IRELYND ZUMSTEIN MRN: [id number] Date of Birth: November 09, 1960

## 2017-07-17 ENCOUNTER — Ambulatory Visit: Payer: Medicaid Other | Attending: Neurosurgery | Admitting: Physical Therapy

## 2017-07-17 ENCOUNTER — Encounter

## 2017-07-17 ENCOUNTER — Encounter: Payer: Self-pay | Admitting: Physical Therapy

## 2017-07-17 DIAGNOSIS — R2689 Other abnormalities of gait and mobility: Secondary | ICD-10-CM | POA: Diagnosis not present

## 2017-07-17 DIAGNOSIS — G8929 Other chronic pain: Secondary | ICD-10-CM

## 2017-07-17 DIAGNOSIS — M6281 Muscle weakness (generalized): Secondary | ICD-10-CM | POA: Diagnosis not present

## 2017-07-17 DIAGNOSIS — M25551 Pain in right hip: Secondary | ICD-10-CM | POA: Insufficient documentation

## 2017-07-17 DIAGNOSIS — M62830 Muscle spasm of back: Secondary | ICD-10-CM | POA: Diagnosis not present

## 2017-07-17 DIAGNOSIS — M545 Low back pain, unspecified: Secondary | ICD-10-CM

## 2017-07-17 DIAGNOSIS — R252 Cramp and spasm: Secondary | ICD-10-CM

## 2017-07-17 DIAGNOSIS — M791 Myalgia, unspecified site: Secondary | ICD-10-CM | POA: Insufficient documentation

## 2017-07-17 NOTE — Therapy (Addendum)
[place], [place], [address] Phone: [phone]   Fax:  [phone]  Physical Therapy Treatment  Patient Details  Name: RONELLE MICHIE MRN: [id number] Date of Birth: Oct 16, 1960 Referring Provider: Dr Orma Render    Encounter Date: 07/17/2017  PT End of Session - 07/17/17 0947    Visit Number  5    Number of Visits  12    Date for PT Re-Evaluation  06/18/17    Authorization Type  [employer]  Will re-submit     PT Start Time  0800    PT Stop Time  0848    PT Time Calculation (min)  48 min    Activity Tolerance  Patient tolerated treatment well    Behavior During Therapy  [facility] for tasks assessed/performed       Past Medical History:  Diagnosis Date  . Anemia   . Anxiety   . Arthritis   . Atrophic endometrium 12/18/2016  . Fibromyalgia   . GERD (gastroesophageal reflux disease)   . Hypertension   . Jaundice    AGE 57 OR 57  . Mitral valve problem   . Wears glasses   . Wears partial dentures    UPPER    Past Surgical History:  Procedure Laterality Date  . CLOSED MANIPULATION KNEE WITH STERIOD INJECTION  2011   rt  . COLONOSCOPY    . Cotton Osteotomy with Bone Graft Right 02/23/2014   Rt foot @ [facility]  . DIAGNOSTIC LAPAROSCOPY    . FOOT BONE EXCISION     both feet-spurs  . FOOT SURGERY     plantar fascitis  . HYSTEROSCOPY W/D&C N/A 12/18/2016   Procedure: DILATATION AND CURETTAGE /HYSTEROSCOPY;  Surgeon: Janyth Contes, MD;  Location: [place];  Service: Gynecology;  Laterality: N/A;  . JOINT REPLACEMENT  2011   rt total knee replacement  . KNEE ARTHROPLASTY  2012   rt  . OPEN REDUCTION INTERNAL FIXATION (ORIF) METACARPAL Left 07/06/2012   Procedure: LEFT HAND EXPLORATION WOUNDS, OPEN REDUCTION INTERNAL FIXATION SMALL METACARPAL FRACTURE;  Surgeon: Tennis Must, MD;  Location: [place];  Service: Orthopedics;  Laterality: Left;  . REPAIR EXTENSOR  TENDON Left 07/06/2012   Procedure: REPAIR EXTENSOR TENDON;  Surgeon: Tennis Must, MD;  Location: [place];  Service: Orthopedics;  Laterality: Left;  Left   . SHOULDER ARTHROSCOPY WITH DISTAL CLAVICLE RESECTION Left 01/24/2016   Procedure: SHOULDER ARTHROSCOPY WITH DISTAL CLAVICLE RESECTION;  Surgeon: [name], MD;  Location: [place];  Service: Orthopedics;  Laterality: Left;  . SHOULDER ARTHROSCOPY WITH ROTATOR CUFF REPAIR AND SUBACROMIAL DECOMPRESSION Left 01/24/2016   Procedure: LEFT SHOULDER ARTHROSCOPY ACROMIOPLASTY, CLAVICLECTOMY, ARTHROSCOPIC ROTATOR CUFF REPAIR;  Surgeon: [name], MD;  Location: [place];  Service: Orthopedics;  Laterality: Left;  . SHOULDER SURGERY  2010   rt    There were no vitals filed for this visit.  Subjective Assessment - 07/17/17 0803    Subjective  Patient reports her pain today is about a 7/210. She s been dealing with some stress in her life.     Limitations  Sitting;Standing;Walking    How long can you sit comfortably?  6-29 mins; certain chairs are worse     How long can you stand comfortably?  <5 mins     How long can you walk comfortably?  painful at all times with walking     Diagnostic tests  MRI:     Patient Stated Goals  improve pain     Currently in Pain?  Yes    Pain Score  7     Pain Orientation  Left;Right    Pain Descriptors / Indicators  Aching    Pain Type  Chronic pain    Pain Onset  More than a month ago    Pain Frequency  Constant                       [facility] Adult PT Treatment/Exercise - 07/17/17 0001      Lumbar Exercises: Seated   Other Seated Lumbar Exercises  seated ball roll out forward x 10; lateral x 5; scap retraction yelllow theraband 2 x 10; seated hip abduction yellow theraband 2 x 10; seated ball squeeze with abdominal breathing 1 x 10 second set x 5 (pt had to stop due to pain); posterior pelvic tilt 5 sec hold x 5 (pt reports pain)     Other Seated Lumbar Exercises  ball press 2 x 10      Modalities   Modalities  Cryotherapy      Cryotherapy   Number Minutes Cryotherapy  10 Minutes    Cryotherapy Location  -- low back    Type of Cryotherapy  Ice pack      Manual Therapy   Manual Therapy  Soft tissue mobilization MTPr with tennis ball; B lumbar paraspinals             PT Education - 07/17/17 0947    Education provided  Yes    Education Details  HEP review    Person(s) Educated  Patient    Methods  Explanation    Comprehension  Verbalized understanding       PT Short Term Goals - 06/25/17 1344      PT SHORT TERM GOAL #1   Title  Patient will increase lumbar flexion by 25%     Baseline  25% improvement     Time  4    Period  Weeks    Status  Achieved      PT SHORT TERM GOAL #2   Title  Patient will improve gross bilateral lower extremity strength to 4/5     Baseline  4/5 gross strength     Time  4    Period  Weeks    Status  Achieved      PT SHORT TERM GOAL #3   Title  Patient will report pain < 4/10 with standing     Baseline  continues to have 7/10 pain     Time  4    Period  Weeks    Status  On-going        PT Long Term Goals - 06/26/17 7062      PT LONG TERM GOAL #1   Title  independent with advanced HEP    Baseline  Independent with basic HEP but is yet to do standing exercise or higher level exercises     Time  4    Period  Weeks    Status  New    Target Date  07/24/17      PT LONG TERM GOAL #2   Title  pt will be able to amb > 15 min without increase in pain for improved function    Baseline  10 min before needing a seted rest break     Time  4    Period  Weeks  Status  New    Target Date  07/24/17      PT LONG TERM GOAL #3   Title  Patient will demostrate a 25% limitation in lumbar flexion in order to put her shoes on     Time  4    Period  Weeks    Status  New    Target Date  07/24/17            Plan - 07/17/17 0948    Clinical Impression Statement   Treatment today focused on core activation and hip strengthening. Pt performing exercises well, however, pt reported increasing pain in her back towards the end of treatment session. Performed soft tissue mobilization and MTPr to lumbar paraspinals with tennis ball, pt reported this helped her pain. Pt also requested cryotherapy at end of session.     Rehab Potential  Good    PT Frequency  2x / week    PT Duration  4 weeks    PT Treatment/Interventions  ADLs/Self Care Home Management;Cryotherapy;Electrical Stimulation;Iontophoresis 4mg /ml Dexamethasone;Moist Heat;Therapeutic exercise;Therapeutic activities;Functional mobility training;Stair training;Gait training;Ultrasound;Balance training;Neuromuscular re-education;Patient/family education;Manual techniques;Dry needling    PT Next Visit Plan  Patient still can not lie on her back. tried scap retraction and patient began to have severe hip pain. continue with current exercises.     PT Home Exercise Plan  seated glut squeeze; seated pelvic tilt     Consulted and Agree with Plan of Care  Patient       Patient will benefit from skilled therapeutic intervention in order to improve the following deficits and impairments:  Abnormal gait, Pain, Increased fascial restricitons, Increased muscle spasms, Difficulty walking, Decreased balance, Decreased strength, Impaired flexibility, Decreased activity tolerance  Visit Diagnosis: Chronic bilateral low back pain without sciatica  Muscle spasm of back  Muscle weakness (generalized)  Other abnormalities of gait and mobility  Pain in right hip  Cramp and spasm  Myalgia     Problem List Patient Active Problem List   Diagnosis Date Noted  . Back pain with right-sided radiculopathy 03/11/2017  . Piriformis syndrome of right side 01/09/2017  . Postmenopausal bleeding 12/31/2016  . Atrophic endometrium 12/18/2016  . Left flank pain 10/23/2016  . Hypertension 10/08/2016  . Fibromyalgia 10/08/2016   . Osteoarthritis 10/08/2016  . GERD (gastroesophageal reflux disease) 10/08/2016  . Anemia 10/08/2016  . Screening for cholesterol level 10/08/2016  . Preventative health care 10/08/2016  . Screening for diabetes mellitus (DM) 10/08/2016  . Status post right foot surgery 04/12/2014  . 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 Pronation deformity of ankle, acquired 02/01/2014  . Tenosynovitis of right ankle 02/01/2014   Carolyne Littles PT DPT  07/17/2017 Karleen Dolphin SPT  07/17/2017, 9:53 AM  [facility] [address] [place], [place], [address] Phone: [phone]   Fax:  [phone]  Name: TAIYLOR VIRDEN MRN: [id number] Date of Birth: 12/16/1960

## 2017-07-21 ENCOUNTER — Ambulatory Visit: Payer: Medicaid Other | Admitting: Physical Therapy

## 2017-07-21 ENCOUNTER — Encounter: Payer: Self-pay | Admitting: Physical Therapy

## 2017-07-21 DIAGNOSIS — M6281 Muscle weakness (generalized): Secondary | ICD-10-CM | POA: Diagnosis not present

## 2017-07-21 DIAGNOSIS — R252 Cramp and spasm: Secondary | ICD-10-CM | POA: Diagnosis not present

## 2017-07-21 DIAGNOSIS — M25551 Pain in right hip: Secondary | ICD-10-CM

## 2017-07-21 DIAGNOSIS — M791 Myalgia, unspecified site: Secondary | ICD-10-CM | POA: Diagnosis not present

## 2017-07-21 DIAGNOSIS — G8929 Other chronic pain: Secondary | ICD-10-CM | POA: Diagnosis not present

## 2017-07-21 DIAGNOSIS — R2689 Other abnormalities of gait and mobility: Secondary | ICD-10-CM

## 2017-07-21 DIAGNOSIS — M62830 Muscle spasm of back: Secondary | ICD-10-CM | POA: Diagnosis not present

## 2017-07-21 DIAGNOSIS — M545 Low back pain, unspecified: Secondary | ICD-10-CM

## 2017-07-21 NOTE — Patient Instructions (Addendum)
Sleeping on Back  Place pillow under knees. A pillow with cervical support and a roll around waist are also helpful. Copyright  VHI. All rights reserved.  Sleeping on Side Place pillow between knees. Use cervical support under neck and a roll around waist as needed. Copyright  VHI. All rights reserved.   Sleeping on Stomach   If this is the only desirable sleeping position, place pillow under lower legs, and under stomach or chest as needed.  Posture - Sitting   Sit upright, head facing forward. Try using a roll to support lower back. Keep shoulders relaxed, and avoid rounded back. Keep hips level with knees. Avoid crossing legs for long periods. Stand to Sit / Sit to Stand   To sit: Bend knees to lower self onto front edge of chair, then scoot back on seat. To stand: Reverse sequence by placing one foot forward, and scoot to front of seat. Use rocking motion to stand up.   Work Height and Reach  Ideal work height is no more than 2 to 4 inches below elbow level when standing, and at elbow level when sitting. Reaching should be limited to arm's length, with elbows slightly bent.  Bending  Bend at hips and knees, not back. Keep feet shoulder-width apart.    Posture - Standing   Good posture is important. Avoid slouching and forward head thrust. Maintain curve in low back and align ears over shoul- ders, hips over ankles.  Alternating Positions   Alternate tasks and change positions frequently to reduce fatigue and muscle tension. Take rest breaks. Computer Work   Position work to face forward. Use proper work and seat height. Keep shoulders back and down, wrists straight, and elbows at right angles. Use chair that provides full back support. Add footrest and lumbar roll as needed.  Getting Into / Out of Car  Lower self onto seat, scoot back, then bring in one leg at a time. Reverse sequence to get out.  Dressing  Lie on back to pull socks or slacks over feet, or sit  and bend leg while keeping back straight.    Housework - Sink  Place one foot on ledge of cabinet under sink when standing at sink for prolonged periods.   Pushing / Pulling  Pushing is preferable to pulling. Keep back in proper alignment, and use leg muscles to do the work.  Deep Squat   Squat and lift with both arms held against upper trunk. Tighten stomach muscles without holding breath. Use smooth movements to avoid jerking.  Avoid Twisting   Avoid twisting or bending back. Pivot around using foot movements, and bend at knees if needed when reaching for articles.  Carrying Luggage   Distribute weight evenly on both sides. Use a cart whenever possible. Do not twist trunk. Move body as a unit.   Lifting Principles .Maintain proper posture and head alignment. .Slide object as close as possible before lifting. .Move obstacles out of the way. .Test before lifting; ask for help if too heavy. .Tighten stomach muscles without holding breath. .Use smooth movements; do not jerk. .Use legs to do the work, and pivot with feet. .Distribute the work load symmetrically and close to the center of trunk. .Push instead of pull whenever possible.   Ask For Help   Ask for help and delegate to others when possible. Coordinate your movements when lifting together, and maintain the low back curve.  Log Roll   Lying on back, bend left knee and place left   arm across chest. Roll all in one movement to the right. Reverse to roll to the left. Always move as one unit. Housework - Sweeping  Use long-handled equipment to avoid stooping.   Housework - Wiping  Position yourself as close as possible to reach work surface. Avoid straining your back.  Laundry - Unloading Wash   To unload small items at bottom of washer, lift leg opposite to arm being used to reach.  [place] close to area to be raked. Use arm movements to do the work. Keep back straight and avoid  twisting.     Cart  When reaching into cart with one arm, lift opposite leg to keep back straight.   Getting Into / Out of Bed  Lower self to lie down on one side by raising legs and lowering head at the same time. Use arms to assist moving without twisting. Bend both knees to roll onto back if desired. To sit up, start from lying on side, and use same move-ments in reverse. Housework - Vacuuming  Hold the vacuum with arm held at side. Step back and forth to move it, keeping head up. Avoid twisting.   Laundry - IT consultant so that bending and twisting can be avoided.   Laundry - Unloading Dryer  Squat down to reach into clothes dryer or use a reacher.  Gardening - Weeding / Probation officer or Kneel. Knee pads may be helpful.                  Functional Quadriceps: Sit to Stand    Sit on edge of chair, feet flat on floor. Stand upright while pressing heel into floor, extending knees fully. Repeat _5 to 10 ___ times per set. Do _1___ sets per session. Do _1 to 3___ sessions per day. OK to use hands.  http://orth.exer.us/735   Copyright  VHI. All rights reserved.

## 2017-07-21 NOTE — Therapy (Signed)
[place], [place], [address] Phone: [phone]   Fax:  [phone]  Physical Therapy Treatment  Patient Details  Name: Sandra Church MRN: [id number] Date of Birth: 03-Jun-1960 Referring Provider: Dr Orma Render    Encounter Date: 07/21/2017  PT End of Session - 07/21/17 1303    Visit Number  6    Number of Visits  12    Date for PT Re-Evaluation  06/18/17    PT Start Time  1149    PT Stop Time  1235    PT Time Calculation (min)  46 min    Activity Tolerance  Patient tolerated treatment well    Behavior During Therapy  [facility] for tasks assessed/performed       Past Medical History:  Diagnosis Date  . Anemia   . Anxiety   . Arthritis   . Atrophic endometrium 12/18/2016  . Fibromyalgia   . GERD (gastroesophageal reflux disease)   . Hypertension   . Jaundice    AGE 7 OR 21  . Mitral valve problem   . Wears glasses   . Wears partial dentures    UPPER    Past Surgical History:  Procedure Laterality Date  . CLOSED MANIPULATION KNEE WITH STERIOD INJECTION  2011   rt  . COLONOSCOPY    . Cotton Osteotomy with Bone Graft Right 02/23/2014   Rt foot @ [facility]  . DIAGNOSTIC LAPAROSCOPY    . FOOT BONE EXCISION     both feet-spurs  . FOOT SURGERY     plantar fascitis  . HYSTEROSCOPY W/D&C N/A 12/18/2016   Procedure: DILATATION AND CURETTAGE /HYSTEROSCOPY;  Surgeon: Janyth Contes, MD;  Location: [place];  Service: Gynecology;  Laterality: N/A;  . JOINT REPLACEMENT  2011   rt total knee replacement  . KNEE ARTHROPLASTY  2012   rt  . OPEN REDUCTION INTERNAL FIXATION (ORIF) METACARPAL Left 07/06/2012   Procedure: LEFT HAND EXPLORATION WOUNDS, OPEN REDUCTION INTERNAL FIXATION SMALL METACARPAL FRACTURE;  Surgeon: Tennis Must, MD;  Location: [place];  Service: Orthopedics;  Laterality: Left;  . REPAIR EXTENSOR TENDON Left 07/06/2012   Procedure: REPAIR EXTENSOR  TENDON;  Surgeon: Tennis Must, MD;  Location: [place];  Service: Orthopedics;  Laterality: Left;  Left   . SHOULDER ARTHROSCOPY WITH DISTAL CLAVICLE RESECTION Left 01/24/2016   Procedure: SHOULDER ARTHROSCOPY WITH DISTAL CLAVICLE RESECTION;  Surgeon: [name], MD;  Location: [place];  Service: Orthopedics;  Laterality: Left;  . SHOULDER ARTHROSCOPY WITH ROTATOR CUFF REPAIR AND SUBACROMIAL DECOMPRESSION Left 01/24/2016   Procedure: LEFT SHOULDER ARTHROSCOPY ACROMIOPLASTY, CLAVICLECTOMY, ARTHROSCOPIC ROTATOR CUFF REPAIR;  Surgeon: [name], MD;  Location: [place];  Service: Orthopedics;  Laterality: Left;  . SHOULDER SURGERY  2010   rt    There were no vitals filed for this visit.  Subjective Assessment - 07/21/17 1157    Subjective  Started the new med for cholesteral since last visit.  does not know the name.  Not sleeping well. She feels safe at home. I have a good support system    Currently in Pain?  Yes    Pain Score  7     Pain Location  Back    Pain Orientation  Right;Left;Lower;Upper    Pain Descriptors / Indicators  Tightness;Shooting shooting with ripples down,  fibromyalgia pain everywhere,  has emotional pain too    Pain Type  Chronic  pain    Pain Radiating Towards  into both feet    Aggravating Factors   moving a certain way  ,  stepping wrong    Pain Relieving Factors  ice,  laying flat on floor,  tennis balls,  tape for feetrolled foam for back support    Effect of Pain on Daily Activities  has to sleep sitting wedged sideways with ice,  ,  can start on the bed on her sides,  sleeps on floor sometimes.  .      Multiple Pain Sites  -- has plantarplantar fascittis, Right knee pain from revision right knee                       [facility] Adult PT Treatment/Exercise - 07/21/17 0001      Self-Care   Other Self-Care Comments   ADL education demo for pTIENT,  practiced neutral sitting      Lumbar  Exercises: Stretches   Standing Extension  3 reps increases lower back pain however pain decreases in legs      Lumbar Exercises: Seated   Other Seated Lumbar Exercises  foot  shortening with neutral sitting to decrease foot pain,  back pain standing    Other Seated Lumbar Exercises  sit to stand 5 x 2,  cued for heel press to decrease knee pain. sitting on airex helped decrease back pain             PT Education - 07/21/17 1258    Education provided  Yes    Education Details  HEP,  ADL posture ed gait    Person(s) Educated  Patient    Methods  Explanation;Demonstration;Tactile cues;Verbal cues;Handout    Comprehension  Verbalized understanding;Returned demonstration       PT Short Term Goals - 06/25/17 1344      PT SHORT TERM GOAL #1   Title  Patient will increase lumbar flexion by 25%     Baseline  25% improvement     Time  4    Period  Weeks    Status  Achieved      PT SHORT TERM GOAL #2   Title  Patient will improve gross bilateral lower extremity strength to 4/5     Baseline  4/5 gross strength     Time  4    Period  Weeks    Status  Achieved      PT SHORT TERM GOAL #3   Title  Patient will report pain < 4/10 with standing     Baseline  continues to have 7/10 pain     Time  4    Period  Weeks    Status  On-going        PT Long Term Goals - 06/26/17 7858      PT LONG TERM GOAL #1   Title  independent with advanced HEP    Baseline  Independent with basic HEP but is yet to do standing exercise or higher level exercises     Time  4    Period  Weeks    Status  New    Target Date  07/24/17      PT LONG TERM GOAL #2   Title  pt will be able to amb > 15 min without increase in pain for improved function    Baseline  10 min before needing a seted rest break     Time  4    Period  Weeks  Status  New    Target Date  07/24/17      PT LONG TERM GOAL #3   Title  Patient will demostrate a 25% limitation in lumbar flexion in order to put her shoes on      Time  4    Period  Weeks    Status  New    Target Date  07/24/17            Plan - 07/21/17 1303    Clinical Impression Statement  Posture ed main focus today.  She was able to centralize pain with standing back extensions.  Sitting tolerated and was neutral sitting on airex.  She will try to find one on line.  Less pain noted at end of session.  No number given.    PT Next Visit Plan  Answer any ADL questions,  review sit to stand.  Add back extension to HEP.  consider cybex leg press.    PT Home Exercise Plan  seated glut squeeze; seated pelvic tilt sit to stand with good hip alignment    Consulted and Agree with Plan of Care  Patient       Patient will benefit from skilled therapeutic intervention in order to improve the following deficits and impairments:     Visit Diagnosis: Chronic bilateral low back pain without sciatica  Muscle spasm of back  Muscle weakness (generalized)  Other abnormalities of gait and mobility  Pain in right hip  Cramp and spasm  Myalgia     Problem List Patient Active Problem List   Diagnosis Date Noted  . Back pain with right-sided radiculopathy 03/11/2017  . Piriformis syndrome of right side 01/09/2017  . Postmenopausal bleeding 12/31/2016  . Atrophic endometrium 12/18/2016  . Left flank pain 10/23/2016  . Hypertension 10/08/2016  . Fibromyalgia 10/08/2016  . Osteoarthritis 10/08/2016  . GERD (gastroesophageal reflux disease) 10/08/2016  . Anemia 10/08/2016  . Screening for cholesterol level 10/08/2016  . Preventative health care 10/08/2016  . Screening for diabetes mellitus (DM) 10/08/2016  . Status post right foot surgery 04/12/2014  . 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 Pronation deformity of ankle, acquired 02/01/2014  . Tenosynovitis of right ankle 02/01/2014    HARRIS,KAREN PTA 07/21/2017, 1:10 PM  [place], [place], [address] Phone:  [phone]   Fax:  [phone]  Name: Sandra Church MRN: [id number] Date of Birth: [birth year]

## 2017-07-24 ENCOUNTER — Encounter: Payer: Self-pay | Admitting: Physical Therapy

## 2017-07-24 ENCOUNTER — Ambulatory Visit: Payer: Medicaid Other | Admitting: Physical Therapy

## 2017-07-24 DIAGNOSIS — R252 Cramp and spasm: Secondary | ICD-10-CM | POA: Diagnosis not present

## 2017-07-24 DIAGNOSIS — R2689 Other abnormalities of gait and mobility: Secondary | ICD-10-CM | POA: Diagnosis not present

## 2017-07-24 DIAGNOSIS — M62830 Muscle spasm of back: Secondary | ICD-10-CM

## 2017-07-24 DIAGNOSIS — M545 Low back pain: Secondary | ICD-10-CM | POA: Diagnosis not present

## 2017-07-24 DIAGNOSIS — M6281 Muscle weakness (generalized): Secondary | ICD-10-CM | POA: Diagnosis not present

## 2017-07-24 DIAGNOSIS — M25551 Pain in right hip: Secondary | ICD-10-CM | POA: Diagnosis not present

## 2017-07-24 DIAGNOSIS — G8929 Other chronic pain: Secondary | ICD-10-CM

## 2017-07-24 DIAGNOSIS — M791 Myalgia, unspecified site: Secondary | ICD-10-CM | POA: Diagnosis not present

## 2017-07-24 NOTE — Therapy (Signed)
[place], [place], [address] Phone: [phone]   Fax:  [phone]  Physical Therapy Treatment  Patient Details  Name: Sandra Church MRN: [id number] Date of Birth: January 09, 1961 Referring Provider: Dr Orma Render    Encounter Date: 07/24/2017  PT End of Session - 07/24/17 0910    Visit Number  7    Number of Visits  12    Date for PT Re-Evaluation  07/29/17    Authorization Type  [employer]  Will re-submit     PT Start Time  [phone] Patient 7 min late    PT Stop Time  0930    PT Time Calculation (min)  38 min    Activity Tolerance  Patient tolerated treatment well    Behavior During Therapy  [facility] for tasks assessed/performed       Past Medical History:  Diagnosis Date  . Anemia   . Anxiety   . Arthritis   . Atrophic endometrium 12/18/2016  . Fibromyalgia   . GERD (gastroesophageal reflux disease)   . Hypertension   . Jaundice    AGE 70 OR 21  . Mitral valve problem   . Wears glasses   . Wears partial dentures    UPPER    Past Surgical History:  Procedure Laterality Date  . CLOSED MANIPULATION KNEE WITH STERIOD INJECTION  2011   rt  . COLONOSCOPY    . Cotton Osteotomy with Bone Graft Right 02/23/2014   Rt foot @ [facility]  . DIAGNOSTIC LAPAROSCOPY    . FOOT BONE EXCISION     both feet-spurs  . FOOT SURGERY     plantar fascitis  . HYSTEROSCOPY W/D&C N/A 12/18/2016   Procedure: DILATATION AND CURETTAGE /HYSTEROSCOPY;  Surgeon: Janyth Contes, MD;  Location: [place];  Service: Gynecology;  Laterality: N/A;  . JOINT REPLACEMENT  2011   rt total knee replacement  . KNEE ARTHROPLASTY  2012   rt  . OPEN REDUCTION INTERNAL FIXATION (ORIF) METACARPAL Left 07/06/2012   Procedure: LEFT HAND EXPLORATION WOUNDS, OPEN REDUCTION INTERNAL FIXATION SMALL METACARPAL FRACTURE;  Surgeon: Tennis Must, MD;  Location: [place];  Service: Orthopedics;  Laterality: Left;   . REPAIR EXTENSOR TENDON Left 07/06/2012   Procedure: REPAIR EXTENSOR TENDON;  Surgeon: Tennis Must, MD;  Location: [place];  Service: Orthopedics;  Laterality: Left;  Left   . SHOULDER ARTHROSCOPY WITH DISTAL CLAVICLE RESECTION Left 01/24/2016   Procedure: SHOULDER ARTHROSCOPY WITH DISTAL CLAVICLE RESECTION;  Surgeon: [name], MD;  Location: [place];  Service: Orthopedics;  Laterality: Left;  . SHOULDER ARTHROSCOPY WITH ROTATOR CUFF REPAIR AND SUBACROMIAL DECOMPRESSION Left 01/24/2016   Procedure: LEFT SHOULDER ARTHROSCOPY ACROMIOPLASTY, CLAVICLECTOMY, ARTHROSCOPIC ROTATOR CUFF REPAIR;  Surgeon: [name], MD;  Location: [place];  Service: Orthopedics;  Laterality: Left;  . SHOULDER SURGERY  2010   rt    There were no vitals filed for this visit.  Subjective Assessment - 07/24/17 0856    Subjective  Patient reports she has been working on the things that therapy reviewed with her on Tuesday. She reports that last night she was having a significant amount of pain. She also had arthritisi pain in her hands.     Limitations  Sitting;Standing;Walking    How long can you sit comfortably?  3-54 mins; certain chairs are worse     How long can you stand comfortably?  <5 mins  How long can you walk comfortably?  painful at all times with walking     Diagnostic tests  MRI:     Patient Stated Goals  improve pain     Currently in Pain?  Yes    Pain Score  6     Pain Location  Back    Pain Orientation  Right;Left    Pain Descriptors / Indicators  Aching    Pain Onset  More than a month ago    Pain Frequency  Constant    Aggravating Factors   moving; lying down     Pain Relieving Factors  ice; lying on the floor, pain meds     Effect of Pain on Daily Activities  diffiuclty sleeping                        [facility] Adult PT Treatment/Exercise - 07/24/17 0001      Lumbar Exercises: Stretches   Active Hamstring  Stretch  3 reps;20 seconds      Lumbar Exercises: Seated   Other Seated Lumbar Exercises  seated ball roll out forward x 10; lateral x 5; scap retraction yelllow theraband 2 x 10; seated hip abduction yellow theraband 2 x 10; seated ball squeeze with abdominal breathing 1 x 10 second set x 5 (pt had to stop due to pain); posterior pelvic tilt 5 sec hold x 5 (pt reports pain)    Other Seated Lumbar Exercises  ball press 2 x 10      Manual Therapy   Manual Therapy  Soft tissue mobilization MTPr with tennis ball; B lumbar paraspinals             PT Education - 07/24/17 0906    Education provided  Yes    Education Details  Reviewed HEP and ther-ex     Person(s) Educated  Patient    Methods  Explanation;Demonstration;Tactile cues;Verbal cues    Comprehension  Verbalized understanding;Returned demonstration;Verbal cues required;Tactile cues required;Need further instruction       PT Short Term Goals - 06/25/17 1344      PT SHORT TERM GOAL #1   Title  Patient will increase lumbar flexion by 25%     Baseline  25% improvement     Time  4    Period  Weeks    Status  Achieved      PT SHORT TERM GOAL #2   Title  Patient will improve gross bilateral lower extremity strength to 4/5     Baseline  4/5 gross strength     Time  4    Period  Weeks    Status  Achieved      PT SHORT TERM GOAL #3   Title  Patient will report pain < 4/10 with standing     Baseline  continues to have 7/10 pain     Time  4    Period  Weeks    Status  On-going        PT Long Term Goals - 06/26/17 4132      PT LONG TERM GOAL #1   Title  independent with advanced HEP    Baseline  Independent with basic HEP but is yet to do standing exercise or higher level exercises     Time  4    Period  Weeks    Status  New    Target Date  07/24/17      PT LONG TERM GOAL #2  Title  pt will be able to amb > 15 min without increase in pain for improved function    Baseline  10 min before needing a seted rest  break     Time  4    Period  Weeks    Status  New    Target Date  07/24/17      PT LONG TERM GOAL #3   Title  Patient will demostrate a 25% limitation in lumbar flexion in order to put her shoes on     Time  4    Period  Weeks    Status  New    Target Date  07/24/17            Plan - 07/24/17 1247    Clinical Impression Statement  Patient tolerated treatment well today. she was able to work on back stretches without an increase in pain. She has been working on her ADL's at home. She has 2 more visits next week then will need to be assessded.     Clinical Presentation  Evolving    Clinical Decision Making  Moderate    Rehab Potential  Good    PT Frequency  2x / week    PT Duration  4 weeks    PT Treatment/Interventions  ADLs/Self Care Home Management;Cryotherapy;Electrical Stimulation;Iontophoresis 4mg /ml Dexamethasone;Moist Heat;Therapeutic exercise;Therapeutic activities;Functional mobility training;Stair training;Gait training;Ultrasound;Balance training;Neuromuscular re-education;Patient/family education;Manual techniques;Dry needling    PT Next Visit Plan  Answer any ADL questions,  review sit to stand.  Add back extension to HEP.  consider cybex leg press.    PT Home Exercise Plan  seated glut squeeze; seated pelvic tilt sit to stand with good hip alignment    Consulted and Agree with Plan of Care  Patient       Patient will benefit from skilled therapeutic intervention in order to improve the following deficits and impairments:  Abnormal gait, Pain, Increased fascial restricitons, Increased muscle spasms, Difficulty walking, Decreased balance, Decreased strength, Impaired flexibility, Decreased activity tolerance  Visit Diagnosis: Chronic bilateral low back pain without sciatica  Muscle spasm of back  Muscle weakness (generalized)  Other abnormalities of gait and mobility  Pain in right hip     Problem List Patient Active Problem List   Diagnosis Date Noted   . Back pain with right-sided radiculopathy 03/11/2017  . Piriformis syndrome of right side 01/09/2017  . Postmenopausal bleeding 12/31/2016  . Atrophic endometrium 12/18/2016  . Left flank pain 10/23/2016  . Hypertension 10/08/2016  . Fibromyalgia 10/08/2016  . Osteoarthritis 10/08/2016  . GERD (gastroesophageal reflux disease) 10/08/2016  . Anemia 10/08/2016  . Screening for cholesterol level 10/08/2016  . Preventative health care 10/08/2016  . Screening for diabetes mellitus (DM) 10/08/2016  . Status post right foot surgery 04/12/2014  . 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 Pronation deformity of ankle, acquired 02/01/2014  . Tenosynovitis of right ankle 02/01/2014    Carney Living  PT DPT  07/24/2017, 12:49 PM  [facility] [address] [place], [place], [address] Phone: [phone]   Fax:  [phone]  Name: Sandra Church MRN: [id number] Date of Birth: [birth year]

## 2017-07-27 ENCOUNTER — Ambulatory Visit: Payer: Medicaid Other | Admitting: Physical Therapy

## 2017-07-28 ENCOUNTER — Ambulatory Visit: Payer: Medicaid Other | Admitting: Physical Therapy

## 2017-07-28 ENCOUNTER — Encounter: Payer: Self-pay | Admitting: Physical Therapy

## 2017-07-28 DIAGNOSIS — M62830 Muscle spasm of back: Secondary | ICD-10-CM | POA: Diagnosis not present

## 2017-07-28 DIAGNOSIS — G8929 Other chronic pain: Secondary | ICD-10-CM | POA: Diagnosis not present

## 2017-07-28 DIAGNOSIS — M545 Low back pain: Secondary | ICD-10-CM | POA: Diagnosis not present

## 2017-07-28 DIAGNOSIS — M25551 Pain in right hip: Secondary | ICD-10-CM

## 2017-07-28 DIAGNOSIS — M6281 Muscle weakness (generalized): Secondary | ICD-10-CM

## 2017-07-28 DIAGNOSIS — R252 Cramp and spasm: Secondary | ICD-10-CM | POA: Diagnosis not present

## 2017-07-28 DIAGNOSIS — M791 Myalgia, unspecified site: Secondary | ICD-10-CM | POA: Diagnosis not present

## 2017-07-28 DIAGNOSIS — R2689 Other abnormalities of gait and mobility: Secondary | ICD-10-CM

## 2017-07-29 DIAGNOSIS — Z6833 Body mass index (BMI) 33.0-33.9, adult: Secondary | ICD-10-CM | POA: Diagnosis not present

## 2017-07-29 DIAGNOSIS — M4316 Spondylolisthesis, lumbar region: Secondary | ICD-10-CM | POA: Diagnosis not present

## 2017-07-29 DIAGNOSIS — M48061 Spinal stenosis, lumbar region without neurogenic claudication: Secondary | ICD-10-CM | POA: Diagnosis not present

## 2017-07-29 DIAGNOSIS — I10 Essential (primary) hypertension: Secondary | ICD-10-CM | POA: Diagnosis not present

## 2017-07-29 DIAGNOSIS — M5416 Radiculopathy, lumbar region: Secondary | ICD-10-CM | POA: Diagnosis not present

## 2017-07-29 DIAGNOSIS — M545 Low back pain: Secondary | ICD-10-CM | POA: Diagnosis not present

## 2017-07-29 NOTE — Therapy (Signed)
[place], [place], [address] Phone: [phone]   Fax:  [phone]  Physical Therapy Treatment/Discharge   Patient Details  Name: Sandra Church MRN: [id number] Date of Birth: [birth year] Referring Provider: Dr Orma Render    Encounter Date: 07/28/2017  PT End of Session - 07/28/17 1121    Visit Number  8    Number of Visits  12    Date for PT Re-Evaluation  07/29/17    Authorization Type  [employer]  Will re-submit     PT Start Time  1102    PT Stop Time  1145    PT Time Calculation (min)  43 min    Activity Tolerance  Patient tolerated treatment well    Behavior During Therapy  [facility] for tasks assessed/performed       Past Medical History:  Diagnosis Date  . Anemia   . Anxiety   . Arthritis   . Atrophic endometrium 12/18/2016  . Fibromyalgia   . GERD (gastroesophageal reflux disease)   . Hypertension   . Jaundice    AGE 33 OR 21  . Mitral valve problem   . Wears glasses   . Wears partial dentures    UPPER    Past Surgical History:  Procedure Laterality Date  . CLOSED MANIPULATION KNEE WITH STERIOD INJECTION  2011   rt  . COLONOSCOPY    . Cotton Osteotomy with Bone Graft Right 02/23/2014   Rt foot @ [facility]  . DIAGNOSTIC LAPAROSCOPY    . FOOT BONE EXCISION     both feet-spurs  . FOOT SURGERY     plantar fascitis  . HYSTEROSCOPY W/D&C N/A 12/18/2016   Procedure: DILATATION AND CURETTAGE /HYSTEROSCOPY;  Surgeon: Janyth Contes, MD;  Location: [place];  Service: Gynecology;  Laterality: N/A;  . JOINT REPLACEMENT  2011   rt total knee replacement  . KNEE ARTHROPLASTY  2012   rt  . OPEN REDUCTION INTERNAL FIXATION (ORIF) METACARPAL Left 07/06/2012   Procedure: LEFT HAND EXPLORATION WOUNDS, OPEN REDUCTION INTERNAL FIXATION SMALL METACARPAL FRACTURE;  Surgeon: Tennis Must, MD;  Location: [place];  Service: Orthopedics;  Laterality: Left;  .  REPAIR EXTENSOR TENDON Left 07/06/2012   Procedure: REPAIR EXTENSOR TENDON;  Surgeon: Tennis Must, MD;  Location: [place];  Service: Orthopedics;  Laterality: Left;  Left   . SHOULDER ARTHROSCOPY WITH DISTAL CLAVICLE RESECTION Left 01/24/2016   Procedure: SHOULDER ARTHROSCOPY WITH DISTAL CLAVICLE RESECTION;  Surgeon: [name], MD;  Location: [place];  Service: Orthopedics;  Laterality: Left;  . SHOULDER ARTHROSCOPY WITH ROTATOR CUFF REPAIR AND SUBACROMIAL DECOMPRESSION Left 01/24/2016   Procedure: LEFT SHOULDER ARTHROSCOPY ACROMIOPLASTY, CLAVICLECTOMY, ARTHROSCOPIC ROTATOR CUFF REPAIR;  Surgeon: [name], MD;  Location: [place];  Service: Orthopedics;  Laterality: Left;  . SHOULDER SURGERY  2010   rt    There were no vitals filed for this visit.  Subjective Assessment - 07/28/17 1106    Subjective  Patient reports she had an onset of lower back pain on Sunday and it became a 10/10 pain on Monday. She stayed in bed yesterday on oice and her pain is better today. Herback just feels sore today.     Limitations  Sitting;Standing;Walking    How long can you sit comfortably?  2-91 mins; certain chairs are worse     How long can you stand comfortably?  <5 mins  How long can you walk comfortably?  painful at all times with walking     Diagnostic tests  MRI:     Patient Stated Goals  improve pain     Currently in Pain?  Yes    Pain Score  8     Pain Location  Back    Pain Orientation  Right;Left    Pain Descriptors / Indicators  Aching    Pain Type  Chronic pain    Pain Onset  More than a month ago    Pain Frequency  Constant    Pain Relieving Factors  ice and lying on the floor     Multiple Pain Sites  No                       [facility] Adult PT Treatment/Exercise - 07/29/17 0001      Self-Care   Other Self-Care Comments   reviewed activity progression and home with fianl HEP. Reviewed what to do on days she  is hurting and what to do on days she is not sore.       Lumbar Exercises: Stretches   Lower Trunk Rotation Limitations  x10  unable to maintain a supine position for long       Lumbar Exercises: Seated   Other Seated Lumbar Exercises  seated ball roll out forward x 10; lateral x 5; scap retraction yelllow theraband 2 x 10; seated hip abduction yellow theraband 2 x 10; seated ball squeeze with abdominal breathing 1 x 10 second set x 5 (pt had to stop due to pain); posterior pelvic tilt 5 sec hold x 5 (pt reports pain)    Other Seated Lumbar Exercises  ball press 2 x 10             PT Education - 07/28/17 1110    Education provided  Yes    Person(s) Educated  Patient    Methods  Explanation;Demonstration;Tactile cues;Verbal cues    Comprehension  Verbalized understanding;Returned demonstration;Verbal cues required;Tactile cues required       PT Short Term Goals - 07/28/17 1126      PT SHORT TERM GOAL #1   Title  Patient will increase lumbar flexion by 25%     Baseline  25% improvement     Time  4    Period  Weeks    Status  Achieved      PT SHORT TERM GOAL #2   Title  Patient will improve gross bilateral lower extremity strength to 4/5     Baseline  4/5 gross strength     Time  4    Period  Weeks    Status  Achieved      PT SHORT TERM GOAL #3   Title  Patient will report pain < 4/10 with standing     Baseline  Pain has improvedbut continues to spike up to 10/10     Time  4    Period  Weeks    Status  Partially Met        PT Long Term Goals - 07/28/17 1127      PT LONG TERM GOAL #1   Title  independent with advanced HEP    Baseline  perfroming all exercises that she is able too     Time  8    Period  Weeks    Status  Achieved      PT LONG TERM GOAL #2   Title  pt  will be able to amb > 15 min without increase in pain for improved function    Baseline  can make it about 10 minutes before she can     Time  4    Period  Weeks    Status  Not Met      PT  LONG TERM GOAL #3   Title  Patient will demostrate a 25% limitation in lumbar flexion in order to put her shoes on     Baseline  still limited about 50 %     Time  4    Period  Weeks    Status  Not Met      PT LONG TERM GOAL #4   Title  \            Plan - 07/28/17 1126    Clinical Impression Statement  Patient has reachedmax ppostential for PT at this time Her pain and mobility have improved but she continues to have times of high pain depending on her activity. Therapy has given her all the strethes and exercises that she can tolerate.  She has not met all goals but has a plan to progress towards them. See below for goal specific progress.     Clinical Presentation  Evolving    Clinical Decision Making  Moderate    Rehab Potential  Good    PT Frequency  2x / week    PT Duration  4 weeks    PT Treatment/Interventions  ADLs/Self Care Home Management;Cryotherapy;Electrical Stimulation;Iontophoresis 61m/ml Dexamethasone;Moist Heat;Therapeutic exercise;Therapeutic activities;Functional mobility training;Stair training;Gait training;Ultrasound;Balance training;Neuromuscular re-education;Patient/family education;Manual techniques;Dry needling    PT Next Visit Plan  Answer any ADL questions,  review sit to stand.  Add back extension to HEP.  consider cybex leg press.    PT Home Exercise Plan  seated glut squeeze; seated pelvic tilt sit to stand with good hip alignment    Consulted and Agree with Plan of Care  Patient       Patient will benefit from skilled therapeutic intervention in order to improve the following deficits and impairments:  Abnormal gait, Pain, Increased fascial restricitons, Increased muscle spasms, Difficulty walking, Decreased balance, Decreased strength, Impaired flexibility, Decreased activity tolerance  Visit Diagnosis: Chronic bilateral low back pain without sciatica  Muscle spasm of back  Muscle weakness (generalized)  Other abnormalities of gait and  mobility  Pain in right hip  PHYSICAL THERAPY DISCHARGE SUMMARY  Visits from Start of Care: 8  Current functional level related to goals / functional outcomes: Improved pain from baseline but still has days where her pain levels are significant    Remaining deficits: Increased pain on certain days that is debilitating to her     Education / Equipment: Has complete HEP   Plan: Patient agrees to discharge.  Patient goals were partially met. Patient is being discharged due to being pleased with the current functional level.  ?????       Problem List Patient Active Problem List   Diagnosis Date Noted  . Back pain with right-sided radiculopathy 03/11/2017  . Piriformis syndrome of right side 01/09/2017  . Postmenopausal bleeding 12/31/2016  . Atrophic endometrium 12/18/2016  . Left flank pain 10/23/2016  . Hypertension 10/08/2016  . Fibromyalgia 10/08/2016  . Osteoarthritis 10/08/2016  . GERD (gastroesophageal reflux disease) 10/08/2016  . Anemia 10/08/2016  . Screening for cholesterol level 10/08/2016  . Preventative health care 10/08/2016  . Screening for diabetes mellitus (DM) 10/08/2016  . Status post right foot surgery  04/12/2014  . 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 Pronation deformity of ankle, acquired 02/01/2014  . Tenosynovitis of right ankle 02/01/2014    Carney Living PT DPT  07/29/2017, 9:06 AM  [facility] [address] [place], [place], [address] Phone: [phone]   Fax:  [phone]  Name: Sandra Church MRN: [id number] Date of Birth: [birth year]

## 2017-08-03 ENCOUNTER — Encounter: Payer: Self-pay | Admitting: *Deleted

## 2017-08-18 ENCOUNTER — Other Ambulatory Visit: Payer: Self-pay

## 2017-08-18 ENCOUNTER — Ambulatory Visit: Payer: Medicaid Other | Admitting: Internal Medicine

## 2017-08-18 ENCOUNTER — Encounter: Payer: Self-pay | Admitting: Internal Medicine

## 2017-08-18 VITALS — BP 138/79 | HR 76 | Temp 98.0°F | Ht 66.0 in | Wt 207.0 lb

## 2017-08-18 DIAGNOSIS — Z0000 Encounter for general adult medical examination without abnormal findings: Secondary | ICD-10-CM

## 2017-08-18 DIAGNOSIS — K219 Gastro-esophageal reflux disease without esophagitis: Secondary | ICD-10-CM

## 2017-08-18 DIAGNOSIS — M549 Dorsalgia, unspecified: Secondary | ICD-10-CM | POA: Diagnosis not present

## 2017-08-18 DIAGNOSIS — M5410 Radiculopathy, site unspecified: Secondary | ICD-10-CM

## 2017-08-18 DIAGNOSIS — Z79899 Other long term (current) drug therapy: Secondary | ICD-10-CM | POA: Diagnosis not present

## 2017-08-18 DIAGNOSIS — Z9181 History of falling: Secondary | ICD-10-CM | POA: Diagnosis not present

## 2017-08-18 DIAGNOSIS — Z791 Long term (current) use of non-steroidal anti-inflammatories (NSAID): Secondary | ICD-10-CM

## 2017-08-18 DIAGNOSIS — R634 Abnormal weight loss: Secondary | ICD-10-CM

## 2017-08-18 DIAGNOSIS — M159 Polyosteoarthritis, unspecified: Secondary | ICD-10-CM

## 2017-08-18 DIAGNOSIS — M7989 Other specified soft tissue disorders: Secondary | ICD-10-CM | POA: Diagnosis not present

## 2017-08-18 DIAGNOSIS — M19041 Primary osteoarthritis, right hand: Secondary | ICD-10-CM | POA: Diagnosis not present

## 2017-08-18 DIAGNOSIS — Z6833 Body mass index (BMI) 33.0-33.9, adult: Secondary | ICD-10-CM | POA: Diagnosis not present

## 2017-08-18 DIAGNOSIS — M19042 Primary osteoarthritis, left hand: Secondary | ICD-10-CM | POA: Diagnosis not present

## 2017-08-18 MED ORDER — DICLOFENAC SODIUM 1 % TD GEL: 2.0000 g | Freq: Four times a day (QID) | TRANSDERMAL | 1 refills | Status: DC

## 2017-08-18 NOTE — Progress Notes (Signed)
   CC: bilateral hand pain  HPI: Sandra Church is a 57 y.o. female with PMH as documented below presenting today for bilateral hand pain and to establish care. Please see encounter based charting for a detailed description of the patient's acute and chronic medical problems.   Past Medical History:  Diagnosis Date  . Anemia   . Anxiety   . Arthritis   . Atrophic endometrium 12/18/2016  . Fibromyalgia   . GERD (gastroesophageal reflux disease)   . Hypertension   . Jaundice    AGE 69 OR 21  . Mitral valve problem   . Wears glasses   . Wears partial dentures    UPPER   Review of Systems:   Review of Systems  Constitutional: Positive for weight loss. Negative for chills and fever.  HENT: Negative for congestion, sinus pain and sore throat.   Respiratory: Negative for cough, shortness of breath and wheezing.   Cardiovascular: Positive for leg swelling. Negative for chest pain, palpitations and PND.  Gastrointestinal: Negative for constipation, diarrhea, heartburn and nausea.  Musculoskeletal: Positive for back pain, falls, joint pain and myalgias. Negative for neck pain.  Neurological: Negative for dizziness, tingling, sensory change, focal weakness and headaches.  All other systems reviewed and are negative.      Physical Exam:  There were no vitals filed for this visit.   Physical Exam  Constitution: NAD, obese, noted discomfort while sitting HENT: atraumatic, normocephalic Eyes: no scleral icterus, EOM intact Cardio: RRR, no murmurs, rubs, gallops, no edema Respiratory: CTAB, no wheezing, rhonci, rales Abdominal: +BS, NTTP, non-distended, soft MSK: NTTP hands b/l, mild swelling in 2nd & 3rd MCP joints right hand. No erythema or diffuse swelling Neuro: A&O, pleasant, cooperative Skin: clean, dry, intact    Assessment & Plan:   See Encounters Tab for problem based charting.  Problems addressed today: Back pain with right-sided radiculopathy,  Osteoarthritis, GERD (NSAID use), Preventative Care  Patient seen with Dr. Evette Doffing

## 2017-08-18 NOTE — Assessment & Plan Note (Signed)
She is still taking her medications. Notes some abdominal pain with goody powders.   - BMP - encouraged reduced NSAID use and to not other NSAIDs -continue prilosec

## 2017-08-18 NOTE — Assessment & Plan Note (Signed)
-   BMP to assess kidney function due to NSAID use  - referred for colonoscopy  - she will have mammogram with Dr. Vianne Bulls (sp?) - agrees for HIV & Hep C screening

## 2017-08-18 NOTE — Assessment & Plan Note (Signed)
She has had increasing pain in her bilaterally, the right worse than the left. Her pain mostly surrounds the metacarpophalangeal joints. She describes it as an aching pain that increases with work throughout the day. She states she often massages her legs and thinks this may be making the pain worse. She notes some swelling. On exam there is no erythema or swelling or TTP today.   - encouraged use of ice and resting her hands  - use hand held massager for legs  - NSAIDs can be used but limit due to GERD.

## 2017-08-18 NOTE — Assessment & Plan Note (Addendum)
She had MRI with Dr. Vertell Limber and states she was not found to be a candidate for surgery. She has been referred back to therapy since May 2019 and states she feels it is helping somewhat. She was also referred to Dr. Maryjean Ka for pain shots in lower spine, which have been controlling her pain. Her Next appt with Dr. Maryjean Ka September 19th. She is not taking prescribed Meloxicam 15mg  because it was not helping her pain control. She states she also uses ice, hot water baths, and other methods to treat her pain. She states she is only using goody powders as form of NSAID. Flexaril also helped, but she only takes this at night as it makes her sleepy.   - will order voltaren gel as she did not pick up on previous office visit  - continue flexeril

## 2017-08-19 ENCOUNTER — Encounter: Payer: Self-pay | Admitting: Gastroenterology

## 2017-08-19 LAB — BMP8+ANION GAP
Anion Gap: 15 mmol/L (ref 10.0–18.0)
BUN/Creatinine Ratio: 21 (ref 9–23)
BUN: 17 mg/dL (ref 6–24)
CALCIUM: 9.8 mg/dL (ref 8.7–10.2)
CO2: 23 mmol/L (ref 20–29)
CREATININE: 0.82 mg/dL (ref 0.57–1.00)
Chloride: 102 mmol/L (ref 96–106)
GFR calc Af Amer: 91 mL/min/{1.73_m2} (ref >59)
GFR, EST NON AFRICAN AMERICAN: 79 mL/min/{1.73_m2} (ref >59)
Glucose: 88 mg/dL (ref 65–99)
Potassium: 3.9 mmol/L (ref 3.5–5.2)
SODIUM: 140 mmol/L (ref 134–144)

## 2017-08-19 LAB — HIV ANTIBODY (ROUTINE TESTING W REFLEX): HIV Screen 4th Generation wRfx: NONREACTIVE

## 2017-08-19 LAB — HEPATITIS C ANTIBODY

## 2017-08-19 NOTE — Progress Notes (Signed)
Internal Medicine Clinic Attending  I saw and evaluated the patient.  I personally confirmed the key portions of the history and exam documented by Dr. Seawell and I reviewed pertinent patient test results.  The assessment, diagnosis, and plan were formulated together and I agree with the documentation in the resident's note.     

## 2017-08-19 NOTE — Addendum Note (Signed)
Addended by: Lalla Brothers T on: 08/19/2017 09:51 AM   Modules accepted: Level of Service

## 2017-09-25 ENCOUNTER — Ambulatory Visit (AMBULATORY_SURGERY_CENTER): Payer: Self-pay | Admitting: *Deleted

## 2017-09-25 VITALS — Ht 66.0 in | Wt 207.0 lb

## 2017-09-25 DIAGNOSIS — Z1211 Encounter for screening for malignant neoplasm of colon: Secondary | ICD-10-CM

## 2017-09-25 MED ORDER — NA SULFATE-K SULFATE-MG SULF 17.5-3.13-1.6 GM/177ML PO SOLN: ORAL | 0 refills | Status: DC

## 2017-09-25 NOTE — Progress Notes (Signed)
Patient denies any allergies to eggs or soy. Patient denies any problems with anesthesia/sedation. Patient denies any oxygen use at home. Patient denies taking any diet/weight loss medications or blood thinners. EMMI education offered, pt declined.  

## 2017-10-01 DIAGNOSIS — M5416 Radiculopathy, lumbar region: Secondary | ICD-10-CM | POA: Diagnosis not present

## 2017-10-08 ENCOUNTER — Ambulatory Visit (AMBULATORY_SURGERY_CENTER): Payer: Medicaid Other | Admitting: Gastroenterology

## 2017-10-08 ENCOUNTER — Encounter: Payer: Self-pay | Admitting: Gastroenterology

## 2017-10-08 VITALS — BP 109/71 | HR 60 | Temp 96.6°F | Resp 13 | Ht 66.0 in | Wt 207.0 lb

## 2017-10-08 DIAGNOSIS — D123 Benign neoplasm of transverse colon: Secondary | ICD-10-CM

## 2017-10-08 DIAGNOSIS — Z1211 Encounter for screening for malignant neoplasm of colon: Secondary | ICD-10-CM

## 2017-10-08 DIAGNOSIS — K635 Polyp of colon: Secondary | ICD-10-CM | POA: Diagnosis not present

## 2017-10-08 MED ORDER — SODIUM CHLORIDE 0.9 % IV SOLN: 500.0000 mL | Freq: Once | INTRAVENOUS | Status: DC

## 2017-10-08 NOTE — Op Note (Signed)
Sandra Church: Berklee Battey Procedure Date: 10/08/2017 7:29 AM MRN: [id number] Endoscopist: Thornton Park MD, MD Age: 57 Referring MD:  Date of Birth: [birth year] Gender: Female Account #: [id number] Procedure:                Colonoscopy Indications:              Screening for colorectal malignant neoplasm. No                            known family history of colon cancer or polyps. No                            baseline GI symptoms. Last colonoscopy 2009. Medicines:                See the Anesthesia note for documentation of the                            administered medications Procedure:                Pre-Anesthesia Assessment:                           - Prior to the procedure, a History and Physical                            was performed, and patient medications and                            allergies were reviewed. The patient's tolerance of                            previous anesthesia was also reviewed. The risks                            and benefits of the procedure and the sedation                            options and risks were discussed with the patient.                            All questions were answered, and informed consent                            was obtained. Prior Anticoagulants: The patient has                            taken no previous anticoagulant or antiplatelet                            agents. ASA Grade Assessment: II - A patient with                            mild systemic disease. After reviewing the risks  and benefits, the patient was deemed in                            satisfactory condition to undergo the procedure.                           After obtaining informed consent, the colonoscope                            was passed under direct vision. Throughout the                            procedure, the patient's blood pressure, pulse, and                            oxygen  saturations were monitored continuously. The                            Colonoscope was introduced through the anus and                            advanced to the the terminal ileum, with                            identification of the appendiceal orifice and IC                            valve. The colonoscopy was performed without                            difficulty. The patient tolerated the procedure                            well. The quality of the bowel preparation was                            excellent. Scope In: 8:12:10 AM Scope Out: 8:24:38 AM Scope Withdrawal Time: 0 hours 9 minutes 59 seconds  Total Procedure Duration: 0 hours 12 minutes 28 seconds  Findings:                 The perianal and digital rectal examinations were                            normal.                           A 1 mm polyp was found in the proximal transverse                            colon. The polyp was sessile. The polyp was removed                            with a cold biopsy forceps. Resection and retrieval  were complete. Estimated blood loss: none.                           A few small-mouthed diverticula were found in the                            sigmoid colon and descending colon.                           There was a large hypertrophied anal papilla. It                            was soft and floppy and the overlying mucosa                            appeared normal. I did not biopsy due to the                            vascular appearance under the papilla. Complications:            No immediate complications. Estimated Blood Loss:     Estimated blood loss: none. Impression:               - One 1 mm polyp in the proximal transverse colon,                            removed with a cold biopsy forceps. Resected and                            retrieved.                           - Mild diverticulosis in the sigmoid colon and in                             the descending colon. Recommendation:           - Discharge patient to home.                           - Resume previous diet. High fiber diet recommended.                           - Continue present medications.                           - Await pathology results.                           - Repeat colonoscopy in 5 years for surveillance if                            the polyp is an adenoma. If the polyp is benign,                            continue with  routine colon cancer screening.                            Current guidelines suggest repeating a colonoscopy                            in 10 years or earlier with the development of new                            symptoms. Thornton Park MD, MD 10/08/2017 8:48:47 AM This report has been signed electronically.

## 2017-10-08 NOTE — Progress Notes (Signed)
To PACU, VSS. Report to [email or web address] 

## 2017-10-08 NOTE — Progress Notes (Signed)
Pt's states no medical or surgical changes since previsit or office visit. 

## 2017-10-08 NOTE — Patient Instructions (Addendum)
Handouts given on polyps   YOU HAD AN ENDOSCOPIC PROCEDURE TODAY AT [facility]:   Refer to the procedure report that was given to you for any specific questions about what was found during the examination.  If the procedure report does not answer your questions, please call your gastroenterologist to clarify.  If you requested that your care partner not be given the details of your procedure findings, then the procedure report has been included in a sealed envelope for you to review at your convenience later.  YOU SHOULD EXPECT: Some feelings of bloating in the abdomen. Passage of more gas than usual.  Walking can help get rid of the air that was put into your GI tract during the procedure and reduce the bloating. If you had a lower endoscopy (such as a colonoscopy or flexible sigmoidoscopy) you may notice spotting of blood in your stool or on the toilet paper. If you underwent a bowel prep for your procedure, you may not have a normal bowel movement for a few days.  Please Note:  You might notice some irritation and congestion in your nose or some drainage.  This is from the oxygen used during your procedure.  There is no need for concern and it should clear up in a day or so.  SYMPTOMS TO REPORT IMMEDIATELY:   Following lower endoscopy (colonoscopy or flexible sigmoidoscopy):  Excessive amounts of blood in the stool  Significant tenderness or worsening of abdominal pains  Swelling of the abdomen that is new, acute  Fever of 100F or higher   For urgent or emergent issues, a gastroenterologist can be reached at any hour by calling [phone].   DIET:  We do recommend a small meal at first, but then you may proceed to your regular diet.  Drink plenty of fluids but you should avoid alcoholic beverages for 24 hours. A High Fiber diet is recommended (see handout given to you by your recovery nurse).  MEDICATIONS: Continue present medications.  Please see handouts given to  you by your recovery nurse.  ACTIVITY:  You should plan to take it easy for the rest of today and you should NOT DRIVE or use heavy machinery until tomorrow (because of the sedation medicines used during the test).    FOLLOW UP: Our staff will call the number listed on your records the next business day following your procedure to check on you and address any questions or concerns that you may have regarding the information given to you following your procedure. If we do not reach you, we will leave a message.  However, if you are feeling well and you are not experiencing any problems, there is no need to return our call.  We will assume that you have returned to your regular daily activities without incident.  If any biopsies were taken you will be contacted by phone or by letter within the next 1-3 weeks.  Please call us at [phone] if you have not heard about the biopsies in 3 weeks.   Thank you for allowing [place] to provide for your healthcare needs today.  SIGNATURES/CONFIDENTIALITY: You and/or your care partner have signed paperwork which will be entered into your electronic medical record.  These signatures attest to the fact that that the information above on your After Visit Summary has been reviewed and is understood.  Full responsibility of the confidentiality of this discharge information lies with you and/or your care-partner.

## 2017-10-09 ENCOUNTER — Telehealth: Payer: Self-pay | Admitting: *Deleted

## 2017-10-09 NOTE — Telephone Encounter (Signed)
  Follow up Call-  Call back number 10/08/2017  Post procedure Call Back phone  # [phone]  Permission to leave phone message Yes  Some recent data might be hidden     Patient questions:  Do you have a fever, pain , or abdominal swelling? No. Pain Score  0 *  Have you tolerated food without any problems? Yes.    Have you been able to return to your normal activities? Yes.    Do you have any questions about your discharge instructions: Diet   No. Medications  No. Follow up visit  No.  Do you have questions or concerns about your Care? No.  Actions: * If pain score is 4 or above: No action needed, pain <4.

## 2017-10-14 ENCOUNTER — Encounter: Payer: Self-pay | Admitting: Gastroenterology

## 2017-10-19 DIAGNOSIS — Z23 Encounter for immunization: Secondary | ICD-10-CM | POA: Diagnosis not present

## 2017-11-16 DIAGNOSIS — M1990 Unspecified osteoarthritis, unspecified site: Secondary | ICD-10-CM | POA: Diagnosis not present

## 2017-11-16 DIAGNOSIS — I208 Other forms of angina pectoris: Secondary | ICD-10-CM | POA: Diagnosis not present

## 2017-11-16 DIAGNOSIS — E785 Hyperlipidemia, unspecified: Secondary | ICD-10-CM | POA: Diagnosis not present

## 2017-11-16 DIAGNOSIS — I10 Essential (primary) hypertension: Secondary | ICD-10-CM | POA: Diagnosis not present

## 2017-11-26 ENCOUNTER — Telehealth: Payer: Self-pay | Admitting: *Deleted

## 2017-11-26 NOTE — Telephone Encounter (Signed)
Call to [employer] for PA for Diclofenac Gel 1%.  Approved 11/26/2017 thru 12/25/2017  [id number]. [id number].  Sander Nephew, RN 11/26/2017 4:15 PM.

## 2017-12-02 ENCOUNTER — Other Ambulatory Visit: Payer: Self-pay | Admitting: *Deleted

## 2017-12-02 MED ORDER — DICLOFENAC SODIUM 1 % TD GEL: 2.0000 g | Freq: Four times a day (QID) | TRANSDERMAL | 1 refills | Status: DC

## 2017-12-31 DIAGNOSIS — M5416 Radiculopathy, lumbar region: Secondary | ICD-10-CM | POA: Diagnosis not present

## 2018-02-11 DIAGNOSIS — I10 Essential (primary) hypertension: Secondary | ICD-10-CM | POA: Diagnosis not present

## 2018-02-11 DIAGNOSIS — M545 Low back pain: Secondary | ICD-10-CM | POA: Diagnosis not present

## 2018-02-11 DIAGNOSIS — Z6833 Body mass index (BMI) 33.0-33.9, adult: Secondary | ICD-10-CM | POA: Diagnosis not present

## 2018-02-24 ENCOUNTER — Ambulatory Visit: Payer: Medicaid Other | Admitting: Internal Medicine

## 2018-02-24 ENCOUNTER — Other Ambulatory Visit: Payer: Self-pay

## 2018-02-24 VITALS — BP 134/83 | HR 79 | Temp 97.4°F | Ht 66.0 in | Wt 206.3 lb

## 2018-02-24 DIAGNOSIS — M797 Fibromyalgia: Secondary | ICD-10-CM

## 2018-02-24 DIAGNOSIS — I10 Essential (primary) hypertension: Secondary | ICD-10-CM

## 2018-02-24 DIAGNOSIS — Z131 Encounter for screening for diabetes mellitus: Secondary | ICD-10-CM

## 2018-02-24 DIAGNOSIS — Z0000 Encounter for general adult medical examination without abnormal findings: Secondary | ICD-10-CM

## 2018-02-24 DIAGNOSIS — F329 Major depressive disorder, single episode, unspecified: Secondary | ICD-10-CM | POA: Diagnosis not present

## 2018-02-24 DIAGNOSIS — Z79899 Other long term (current) drug therapy: Secondary | ICD-10-CM

## 2018-02-24 LAB — POCT GLYCOSYLATED HEMOGLOBIN (HGB A1C): HEMOGLOBIN A1C: 5.2 % (ref 4.0–5.6)

## 2018-02-24 LAB — GLUCOSE, CAPILLARY: Glucose-Capillary: 89 mg/dL (ref 70–99)

## 2018-02-24 MED ORDER — AMITRIPTYLINE HCL 10 MG PO TABS: 10.0000 mg | ORAL_TABLET | Freq: Every day | ORAL | 0 refills | Status: DC

## 2018-02-24 NOTE — Patient Instructions (Signed)
Thank you for allowing [place] to provide your care today. Today we discussed your fibromyalgia and reactive depression.   I have ordered the following labs for you:  HemoglobinA1c and basic metabolic panel    I will call with your results.  Today we made the following changes to your medications:   Please START taking Amitriptyline 5 MG tablet once in the evening before bed.   Please STOP taking BC powders as these can cause bleeding and worsening of esophageal reflux.   A referral has been placed to our in-house therapist, Dessie Coma. She will call you to schedule a follow-up appointment.   Please follow-up in one month.    Should you have any questions or concerns please call the internal medicine clinic at [phone].

## 2018-02-24 NOTE — Progress Notes (Signed)
   CC: fibromyalgia pain  HPI:  [name] is a 58 y.o. with PMH as below presenting with pain in her legs and back from her fibromyalgia.   Please see A&P for assessment of the patient's acute and chronic medical conditions.   Past Medical History:  Diagnosis Date  . Anemia   . Anxiety   . Arthritis   . Atrophic endometrium 12/18/2016  . Fibromyalgia   . GERD (gastroesophageal reflux disease)   . Hypertension   . Jaundice    AGE 52 OR 21  . Mitral valve problem   . Wears glasses   . Wears partial dentures    UPPER   Review of Systems:   Review of Systems  Constitutional: Negative for chills, fever and weight loss.  Respiratory: Negative for shortness of breath and wheezing.   Cardiovascular: Positive for chest pain. Negative for palpitations, orthopnea and leg swelling.  Musculoskeletal: Positive for back pain, falls (uses cane) and myalgias. Negative for neck pain.  Neurological: Negative for dizziness, tingling and sensory change.  Psychiatric/Behavioral: Positive for depression. Negative for memory loss, substance abuse and suicidal ideas. The patient is nervous/anxious and has insomnia.    Physical Exam:  Constitution:  Distressed, tearful HENT: AT/[place] Eyes: eom intact, no icterus  Cardio: RRR, no m/r/g  GI: soft, NTTP, non-distended Respiratory: CTA, no w/r/r  MSK: no LE edema Neuro: normal affect, tearful, a&o, cooperative Skin: c/d/i    Vitals:   02/24/18 1551  BP: 134/83  Pulse: 79  Temp: (!) 97.4 F (36.3 C)  TempSrc: Oral  SpO2: 99%  Weight: 206 lb 4.8 oz (93.6 kg)  Height: 5\' 6"  (1.676 m)     Assessment & Plan:   See Encounters Tab for problem based charting.  Patient discussed with Dr. Lynnae January

## 2018-02-25 LAB — BMP8+ANION GAP
Anion Gap: 18 mmol/L (ref 10.0–18.0)
BUN / CREAT RATIO: 13 (ref 9–23)
BUN: 12 mg/dL (ref 6–24)
CO2: 22 mmol/L (ref 20–29)
CREATININE: 0.92 mg/dL (ref 0.57–1.00)
Calcium: 9.9 mg/dL (ref 8.7–10.2)
Chloride: 102 mmol/L (ref 96–106)
GFR calc Af Amer: 79 mL/min/{1.73_m2} (ref >59)
GFR, EST NON AFRICAN AMERICAN: 68 mL/min/{1.73_m2} (ref >59)
Glucose: 83 mg/dL (ref 65–99)
Potassium: 4 mmol/L (ref 3.5–5.2)
SODIUM: 142 mmol/L (ref 134–144)

## 2018-02-27 DIAGNOSIS — F329 Major depressive disorder, single episode, unspecified: Secondary | ICD-10-CM | POA: Insufficient documentation

## 2018-02-27 NOTE — Assessment & Plan Note (Signed)
She continues to have bilateral thigh, back pain and diffuse musculoskeletal pain. She uses heat and ice which sometimes helps. She uses BC powders frequently and discussed that these should be minimally used with her history of GERD. She is open to trying amitriptyline for her ongoing pain.   - start amitriptyline 10mg  qd

## 2018-02-27 NOTE — Assessment & Plan Note (Signed)
Mother diagnosed with gastroesophageal cancer right before appointment. She is tearful and states she has been having a tough time as her brother also passed away in 12/07/22 due to pancreatic cancer. She has had multiple other relatives pass and has been in a lot of pain. Chest pain associated with anxiety. No associated symptoms such as SOB, nausea, tingling. The pain occurs at night when she is lying in bed and her mind is racing and resolves when she calms her self down. She states she has no desire to harm herself or SI. She think she would benefit from speaking with someone.   - referral to in-house therapist - start amitriptyline 10mg  qd, which will also potentially help her fibromyalgia pain  - instructed not to take flexaril when starting this medication as it may cause drowsiness. She has not been taking flexaril as she does not like how it "makes her feel drunk."

## 2018-02-27 NOTE — Assessment & Plan Note (Signed)
Patient was extremely upset about her mother's recent diagnosis and these issues were unable to be addressed. Discuss mammogram, vaccination and pap at follow-up

## 2018-02-27 NOTE — Assessment & Plan Note (Signed)
BP Readings from Last 3 Encounters:  02/24/18 134/83  10/08/17 109/71  08/18/17 138/79  Blood pressure controlled on amlodipine 5mg  qd, losartan 100mg  qd, metoprolol 100 mg 24 hr qd  - cont. Current medications  - BMP

## 2018-03-01 NOTE — Progress Notes (Signed)
Internal Medicine Clinic Attending  Case discussed with Dr. Seawell at the time of the visit.  We reviewed the resident's history and exam and pertinent patient test results.  I agree with the assessment, diagnosis, and plan of care documented in the resident's note.    

## 2018-03-02 ENCOUNTER — Telehealth: Payer: Self-pay | Admitting: Licensed Clinical Social Worker

## 2018-03-02 NOTE — Telephone Encounter (Signed)
Patient was contacted to schedule an appointment given a referral from his PCP. Patient could not be reached and a voicemail was left to schedule an appointment through the main office number.   -Jackalyn Lombard, Behavioral Health Intern

## 2018-03-09 ENCOUNTER — Telehealth: Payer: Self-pay | Admitting: Licensed Clinical Social Worker

## 2018-03-09 NOTE — Addendum Note (Signed)
Addended by: Hulan Fray on: 03/09/2018 07:22 PM   Modules accepted: Orders

## 2018-03-09 NOTE — Telephone Encounter (Signed)
Patient reported she is not interested in our in-clinic behavioral health services at this time due to dealing with family circumstances. Patient understands that she can contact our office back at any time to request services.

## 2018-03-26 ENCOUNTER — Encounter: Payer: Self-pay | Admitting: Internal Medicine

## 2018-03-27 ENCOUNTER — Other Ambulatory Visit: Payer: Self-pay | Admitting: Internal Medicine

## 2018-03-27 DIAGNOSIS — M797 Fibromyalgia: Secondary | ICD-10-CM

## 2018-03-27 DIAGNOSIS — F329 Major depressive disorder, single episode, unspecified: Secondary | ICD-10-CM

## 2018-04-07 ENCOUNTER — Telehealth (INDEPENDENT_AMBULATORY_CARE_PROVIDER_SITE_OTHER): Payer: Medicaid Other | Admitting: Internal Medicine

## 2018-04-07 ENCOUNTER — Other Ambulatory Visit: Payer: Self-pay

## 2018-04-07 DIAGNOSIS — M797 Fibromyalgia: Secondary | ICD-10-CM

## 2018-04-07 DIAGNOSIS — F329 Major depressive disorder, single episode, unspecified: Secondary | ICD-10-CM | POA: Diagnosis not present

## 2018-04-07 DIAGNOSIS — I10 Essential (primary) hypertension: Secondary | ICD-10-CM

## 2018-04-07 DIAGNOSIS — Z0000 Encounter for general adult medical examination without abnormal findings: Secondary | ICD-10-CM

## 2018-04-07 NOTE — Progress Notes (Signed)
   CC: Telehealth   HPI:  [name] is a 58 y.o. with PMH as below.   Please see A&P for assessment of the patient's acute and chronic medical conditions.   Past Medical History:  Diagnosis Date  . Anemia   . Anxiety   . Arthritis   . Atrophic endometrium 12/18/2016  . Fibromyalgia   . GERD (gastroesophageal reflux disease)   . Hypertension   . Jaundice    AGE 73 OR 21  . Mitral valve problem   . Wears glasses   . Wears partial dentures    UPPER   Review of Systems:   Review of Systems  HENT: Negative for sinus pain and sore throat.   Eyes: Positive for blurred vision. Negative for pain and redness.  Respiratory: Negative for cough and shortness of breath.   Cardiovascular: Negative for chest pain, palpitations and leg swelling.  Gastrointestinal: Negative for abdominal pain, constipation, diarrhea, nausea and vomiting.  Musculoskeletal: Positive for back pain, joint pain and myalgias. Negative for falls.  Neurological: Positive for tingling.  Psychiatric/Behavioral: Positive for depression. Negative for suicidal ideas. The patient does not have insomnia.    Physical Exam: Unable to obtain due to telehealth encounter    There were no vitals filed for this visit.   Assessment & Plan:   See Encounters Tab for problem based charting.  Patient discussed with Dr. Evette Doffing  This is a telephone encounter between [place] on 04/07/2018 for follow-up of depression and fibromyalgia. The visit was conducted with the patient located at home and Marty Heck at [facility]. The patient's identity was confirmed using their DOB and current address. The patient has consented to being evaluated through a telephone encounter and understands the associated risks/benefits. I personally spent 20 on medical discussion.

## 2018-04-08 NOTE — Assessment & Plan Note (Signed)
Tele encounter During last appointment she had just found out that her mother had stomach cancer. Since that time her mother has passed away. She states she is doing ok but is taking time to grieve and has strong support from her friends and family. She states she feels the amitriptyline has helped. She was referred to [facility] at previous visit but has since stated she did not want [facility] at this time while she is grieving.   - cont. amitryptiline 10 MG qd  - f/u three months

## 2018-04-08 NOTE — Assessment & Plan Note (Addendum)
Telehealth Her blood pressure medications have previously been controlled by Dr. Terrence Dupont. She is asking if she can transfer bp control to PCP.  Last saw cardiologist back in November - sees Dr. Terrence Dupont. She states at this time she was changed from losartan 100 mg to olmesartan 40 mg qd. She is still taking toprol-XL 100 mg qd and norvasc 5 mg qd  - cont. Current medications - request records from Dr. Zenia Resides office

## 2018-04-08 NOTE — Assessment & Plan Note (Signed)
She states she sees Dr. Thornell Sartorius and has had a mammogram since 2017 although does not appear in the chart. She states she also gets a yearly pap smear with Dr. Melba Coon.   - vaccinations at follow-up

## 2018-04-08 NOTE — Assessment & Plan Note (Signed)
She states the amitryptiline helps with her pain but she continues to have flare-ups. She does not use it all the time as it makes her drowsy. Trying to use ice and heat, massaging her legs. Pain in back and legs with burning. She does not want to try an increased dose of amytriptiline at this time as she is grieving her mother's death. She has a follow-up appointment with Dr. Luan Pulling on 4/02 for a steroid shot and wants to keep this appointment because of the amount of pain she is having.   - follow-up three months

## 2018-04-09 NOTE — Progress Notes (Signed)
Internal Medicine Clinic Attending  Case discussed with Dr. Seawell at the time of the visit.  We reviewed the resident's history and exam and pertinent patient test results.  I agree with the assessment, diagnosis, and plan of care documented in the resident's note.    

## 2018-04-26 ENCOUNTER — Telehealth: Payer: Self-pay | Admitting: *Deleted

## 2018-04-26 NOTE — Telephone Encounter (Signed)
Call to [employer] for PA for Diclofenac Gel 1%.  Information was given.  Approved 04/26/2018 thru 04/21/2018.  [facility] was notified at [phone].  Sander Nephew, RN 04/26/2018 1:52 PM.

## 2018-05-25 DIAGNOSIS — Z130 Encounter for screening for diseases of the blood and blood-forming organs and certain disorders involving the immune mechanism: Secondary | ICD-10-CM | POA: Diagnosis not present

## 2018-05-25 DIAGNOSIS — Z6832 Body mass index (BMI) 32.0-32.9, adult: Secondary | ICD-10-CM | POA: Diagnosis not present

## 2018-05-25 DIAGNOSIS — Z01419 Encounter for gynecological examination (general) (routine) without abnormal findings: Secondary | ICD-10-CM | POA: Diagnosis not present

## 2018-05-25 DIAGNOSIS — N951 Menopausal and female climacteric states: Secondary | ICD-10-CM | POA: Diagnosis not present

## 2018-05-25 DIAGNOSIS — M797 Fibromyalgia: Secondary | ICD-10-CM | POA: Diagnosis not present

## 2018-05-25 DIAGNOSIS — B373 Candidiasis of vulva and vagina: Secondary | ICD-10-CM | POA: Diagnosis not present

## 2018-05-25 DIAGNOSIS — Z124 Encounter for screening for malignant neoplasm of cervix: Secondary | ICD-10-CM | POA: Diagnosis not present

## 2018-05-25 DIAGNOSIS — Z1231 Encounter for screening mammogram for malignant neoplasm of breast: Secondary | ICD-10-CM | POA: Diagnosis not present

## 2018-05-26 DIAGNOSIS — M5416 Radiculopathy, lumbar region: Secondary | ICD-10-CM | POA: Diagnosis not present

## 2018-05-26 DIAGNOSIS — Z124 Encounter for screening for malignant neoplasm of cervix: Secondary | ICD-10-CM | POA: Diagnosis not present

## 2018-05-26 DIAGNOSIS — M545 Low back pain: Secondary | ICD-10-CM | POA: Diagnosis not present

## 2018-06-08 DIAGNOSIS — H5203 Hypermetropia, bilateral: Secondary | ICD-10-CM | POA: Diagnosis not present

## 2018-06-08 DIAGNOSIS — H524 Presbyopia: Secondary | ICD-10-CM | POA: Diagnosis not present

## 2018-06-08 DIAGNOSIS — H52223 Regular astigmatism, bilateral: Secondary | ICD-10-CM | POA: Diagnosis not present

## 2018-07-08 DIAGNOSIS — M5416 Radiculopathy, lumbar region: Secondary | ICD-10-CM | POA: Diagnosis not present

## 2018-07-21 ENCOUNTER — Encounter: Payer: Self-pay | Admitting: Internal Medicine

## 2018-07-21 ENCOUNTER — Ambulatory Visit: Payer: Medicaid Other | Admitting: Internal Medicine

## 2018-07-21 ENCOUNTER — Other Ambulatory Visit: Payer: Self-pay

## 2018-07-21 VITALS — BP 107/80 | HR 86 | Temp 98.2°F | Wt 207.9 lb

## 2018-07-21 DIAGNOSIS — Z9889 Other specified postprocedural states: Secondary | ICD-10-CM

## 2018-07-21 DIAGNOSIS — M722 Plantar fascial fibromatosis: Secondary | ICD-10-CM

## 2018-07-21 DIAGNOSIS — M797 Fibromyalgia: Secondary | ICD-10-CM | POA: Diagnosis not present

## 2018-07-21 DIAGNOSIS — F329 Major depressive disorder, single episode, unspecified: Secondary | ICD-10-CM

## 2018-07-21 DIAGNOSIS — K5900 Constipation, unspecified: Secondary | ICD-10-CM

## 2018-07-21 DIAGNOSIS — Z79899 Other long term (current) drug therapy: Secondary | ICD-10-CM

## 2018-07-21 DIAGNOSIS — M5410 Radiculopathy, site unspecified: Secondary | ICD-10-CM

## 2018-07-21 DIAGNOSIS — Z0000 Encounter for general adult medical examination without abnormal findings: Secondary | ICD-10-CM

## 2018-07-21 MED ORDER — CYCLOBENZAPRINE HCL 5 MG PO TABS: 5.0000 mg | ORAL_TABLET | Freq: Three times a day (TID) | ORAL | 0 refills | Status: DC | PRN

## 2018-07-21 NOTE — Progress Notes (Signed)
   CC: feet pain, constipation   HPI:  Sandra Church is a 58 y.o. with PMH as below.   Please see A&P for assessment of the patient's acute and chronic medical conditions.    Past Medical History:  Diagnosis Date  . Anemia   . Anxiety   . Arthritis   . Atrophic endometrium 12/18/2016  . Fibromyalgia   . GERD (gastroesophageal reflux disease)   . Hypertension   . Jaundice    AGE 58 OR 21  . Mitral valve problem   . Wears glasses   . Wears partial dentures    UPPER   Review of Systems:   Review of Systems  Constitutional: Negative for chills, fever and weight loss.  Cardiovascular: Negative for chest pain and leg swelling.  Gastrointestinal: Positive for constipation. Negative for blood in stool, heartburn, melena and nausea. Abdominal pain: upper abdominal pain with straining   Musculoskeletal: Positive for back pain, joint pain and myalgias.  Neurological: Negative for dizziness, tingling and headaches.  Psychiatric/Behavioral: Negative for depression and suicidal ideas. The patient is not nervous/anxious.     Physical Exam:  Constitution: NAD, appears stated age 58: RRR, no m/r/g; no LE edema  Respiratory: CTA, no w/r/r  Abdominal: non-distended, NTTP, +BS, soft MSK: TTP inferior aspect of feet over lisfrank joint and metatarsals Neuro: a&o, normal affect, pleasant  Skin: dry skin feet bilaterally    Vitals:   07/21/18 0949  BP: 107/80  Pulse: 86  Temp: 98.2 F (36.8 C)  TempSrc: Oral  SpO2: 99%  Weight: 207 lb 14.4 oz (94.3 kg)     Assessment & Plan:   See Encounters Tab for problem based charting.  Patient discussed with Dr. Evette Doffing

## 2018-07-21 NOTE — Patient Instructions (Addendum)
Thank you for allowing [place] to provide your care today. Today we discussed your plantar fasciitis, constipation, and fibromyalgia.    Today we made the following changes to your medications:   I have refilled your flexaril to use as needed for your fibromyalgia.   I have sent a referral for Podiatry.   For your constipation, please buy Metamucil (orange flavor) over the counter and take as needed - typically start with one tablespoon per day (one teaspoon per day with the sugar free orange flavor). Make sure to drink lots of water with this!  Please follow-up in 6 months or sooner if necessary.    Should you have any questions or concerns please call the internal medicine clinic at [phone].

## 2018-07-22 ENCOUNTER — Encounter: Payer: Self-pay | Admitting: Internal Medicine

## 2018-07-22 DIAGNOSIS — K5900 Constipation, unspecified: Secondary | ICD-10-CM

## 2018-07-22 DIAGNOSIS — M722 Plantar fascial fibromatosis: Secondary | ICD-10-CM

## 2018-07-22 HISTORY — DX: Plantar fascial fibromatosis: M72.2

## 2018-07-22 HISTORY — DX: Constipation, unspecified: K59.00

## 2018-07-22 NOTE — Assessment & Plan Note (Signed)
-   had a mammogram and pap smear with Dr. Ray Church 5/12

## 2018-07-22 NOTE — Assessment & Plan Note (Signed)
She has been doing ok although continues to have some sadness occassionally with the recent passing of her mother. Her son was recently admitted to the hospital as well but is doing better now. She has a strong support system at home, and this helps her keep her spirits up. She is tolerating the amitriptyline well and feels that has helped.   - cont. amitriptyline

## 2018-07-22 NOTE — Assessment & Plan Note (Signed)
Her fibromyalgia continues to cause her pain sometimes but is stable and controlled with intermittent ice and heat, amitriptyline. When her pain is unbearable flexaril 5 mg helps, she does not use this everyday. She does continue to use NSAIDs on occasion as well. She sees Dr. Alfonzo Beers for her ongoing back pain as well.     - continue current medications - refill flexaril 5 mg qd

## 2018-07-22 NOTE — Progress Notes (Signed)
Internal Medicine Clinic Attending  Case discussed with Dr. Seawell at the time of the visit.  We reviewed the resident's history and exam and pertinent patient test results.  I agree with the assessment, diagnosis, and plan of care documented in the resident's note.    

## 2018-07-22 NOTE — Assessment & Plan Note (Signed)
Pain has been worse recently. The pain is worse in the inferior anterior area. The pain is mostly in her right foot and is constant. No trauma or acute injury occurred. She has had previous plantar fasciotomy bilaterally in 2016 and has not seen podiatry since that time. She was also prescribed orthotics but did not feel these helped. She wears multiple layers of gel pads in her shoes which does slightly help.  - discussed home therapy to try - she knows most of these exercises and they do not really help anymore - short course increased NSAIDs - avoid goody powders  - referral to podiatry placed

## 2018-07-22 NOTE — Assessment & Plan Note (Signed)
She has been having pain in her upper abdomen when she strains to have BM. She has BM every day, these have been orange in color, she has had increased constipation recently with small pellets of stool. This seemed to be relieved when she ate sweet potatoes the other day. She has not been drinking much water. She does try to eat vegetables but may need to eat more. and she has been having more constipation. She has no abdominal pain after she eats or at other times, only when straining to go to the restroom. No heartburn or nausea. No changes in weight. Had a colonoscopy last September that was good. She does continue to take NSAIDs on occasion.   - OTC metamucil - explained this needs to be taken with lots of water  - encouraged increase water intake  - cont prilosec

## 2018-07-29 DIAGNOSIS — M545 Low back pain: Secondary | ICD-10-CM | POA: Diagnosis not present

## 2018-07-29 DIAGNOSIS — M5416 Radiculopathy, lumbar region: Secondary | ICD-10-CM | POA: Diagnosis not present

## 2018-08-04 ENCOUNTER — Ambulatory Visit: Payer: Medicaid Other | Admitting: Podiatry

## 2018-08-04 ENCOUNTER — Encounter: Payer: Self-pay | Admitting: Podiatry

## 2018-08-04 ENCOUNTER — Other Ambulatory Visit: Payer: Self-pay

## 2018-08-04 ENCOUNTER — Ambulatory Visit (INDEPENDENT_AMBULATORY_CARE_PROVIDER_SITE_OTHER): Payer: Medicaid Other

## 2018-08-04 VITALS — BP 139/82 | HR 73 | Temp 97.7°F

## 2018-08-04 DIAGNOSIS — M722 Plantar fascial fibromatosis: Secondary | ICD-10-CM | POA: Diagnosis not present

## 2018-08-04 DIAGNOSIS — L989 Disorder of the skin and subcutaneous tissue, unspecified: Secondary | ICD-10-CM

## 2018-08-11 DIAGNOSIS — N898 Other specified noninflammatory disorders of vagina: Secondary | ICD-10-CM | POA: Diagnosis not present

## 2018-08-11 DIAGNOSIS — R1033 Periumbilical pain: Secondary | ICD-10-CM | POA: Diagnosis not present

## 2018-08-11 DIAGNOSIS — N760 Acute vaginitis: Secondary | ICD-10-CM | POA: Diagnosis not present

## 2018-08-11 DIAGNOSIS — R102 Pelvic and perineal pain: Secondary | ICD-10-CM | POA: Diagnosis not present

## 2018-08-11 DIAGNOSIS — B373 Candidiasis of vulva and vagina: Secondary | ICD-10-CM | POA: Diagnosis not present

## 2018-08-11 NOTE — Progress Notes (Signed)
   Subjective: 58 y.o. female presenting today as a new patient with a chief complaint of pain to the bilateral feet that has been present for the past few years. She reports associated nodules noted to the posterior heels. Walking and being on her feet for long periods of time increases the pain. She has tried using arch supports and insoles with no significant relief. She also reports h/o surgery to the arches of the feet. Patient is here for further evaluation and treatment.   Past Medical History:  Diagnosis Date  . Anemia   . Anxiety   . Arthritis   . Atrophic endometrium 12/18/2016  . Fibromyalgia   . GERD (gastroesophageal reflux disease)   . Hypertension   . Jaundice    AGE 58 OR 21  . 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 Mitral valve problem   . Postmenopausal bleeding 12/31/2016  . Status post right foot surgery 04/12/2014  . Wears glasses   . Wears partial dentures    UPPER     Objective: Physical Exam General: The patient is alert and oriented x3 in no acute distress.  Dermatology: Hyperkeratotic lesions present on the bilateral feet. Pain on palpation with a central nucleated core noted. Skin is warm, dry and supple bilateral lower extremities. Negative for open lesions or macerations bilateral.   Vascular: Dorsalis Pedis and Posterior Tibial pulses palpable bilateral.  Capillary fill time is immediate to all digits.  Neurological: Epicritic and protective threshold intact bilateral.   Musculoskeletal: Tenderness to palpation to the plantar aspect of the bilateral heels along the plantar fascia. All other joints range of motion within normal limits bilateral. Strength 5/5 in all groups bilateral.   Radiographic exam: Normal osseous mineralization. Joint spaces preserved. No fracture/dislocation/boney destruction. No other soft tissue abnormalities or radiopaque foreign bodies.   Assessment: 1. plantar fasciitis bilateral feet 2. Porokeratosis bilateral feet x 4   Plan of Care:  1. Patient evaluated. Xrays reviewed.   2. Plantar fascial band(s) dispensed for bilateral plantar fasciitis. 3. Instructed patient regarding therapies and modalities at home to alleviate symptoms.  4. Excisional debridement of keratotic lesion using a chisel blade was performed without incident. Light dressing applied.  5. Continue taking Flexeril as directed by PCP.  6. Return to clinic in 3 months.    Edrick Kins, DPM [facility]  Dr. Edrick Kins, DPM    [address], [place] [address]                Office [phone]  Fax [phone]

## 2018-09-13 ENCOUNTER — Other Ambulatory Visit: Payer: Self-pay | Admitting: Internal Medicine

## 2018-09-13 DIAGNOSIS — M797 Fibromyalgia: Secondary | ICD-10-CM

## 2018-09-13 DIAGNOSIS — I10 Essential (primary) hypertension: Secondary | ICD-10-CM | POA: Diagnosis not present

## 2018-09-13 DIAGNOSIS — E785 Hyperlipidemia, unspecified: Secondary | ICD-10-CM | POA: Diagnosis not present

## 2018-09-13 DIAGNOSIS — R0789 Other chest pain: Secondary | ICD-10-CM | POA: Diagnosis not present

## 2018-09-13 DIAGNOSIS — M1990 Unspecified osteoarthritis, unspecified site: Secondary | ICD-10-CM | POA: Diagnosis not present

## 2018-09-13 DIAGNOSIS — I208 Other forms of angina pectoris: Secondary | ICD-10-CM | POA: Diagnosis not present

## 2018-10-04 DIAGNOSIS — E789 Disorder of lipoprotein metabolism, unspecified: Secondary | ICD-10-CM | POA: Diagnosis not present

## 2018-10-04 DIAGNOSIS — I208 Other forms of angina pectoris: Secondary | ICD-10-CM | POA: Diagnosis not present

## 2018-10-04 DIAGNOSIS — I10 Essential (primary) hypertension: Secondary | ICD-10-CM | POA: Diagnosis not present

## 2018-10-04 DIAGNOSIS — E669 Obesity, unspecified: Secondary | ICD-10-CM | POA: Diagnosis not present

## 2018-10-04 DIAGNOSIS — R0789 Other chest pain: Secondary | ICD-10-CM | POA: Diagnosis not present

## 2018-10-04 DIAGNOSIS — M1990 Unspecified osteoarthritis, unspecified site: Secondary | ICD-10-CM | POA: Diagnosis not present

## 2018-10-04 DIAGNOSIS — E785 Hyperlipidemia, unspecified: Secondary | ICD-10-CM | POA: Diagnosis not present

## 2018-10-11 DIAGNOSIS — M5416 Radiculopathy, lumbar region: Secondary | ICD-10-CM | POA: Diagnosis not present

## 2018-10-13 ENCOUNTER — Encounter: Payer: Self-pay | Admitting: Internal Medicine

## 2018-10-13 ENCOUNTER — Ambulatory Visit: Payer: Medicaid Other | Admitting: Internal Medicine

## 2018-10-13 ENCOUNTER — Other Ambulatory Visit: Payer: Self-pay

## 2018-10-13 ENCOUNTER — Encounter (INDEPENDENT_AMBULATORY_CARE_PROVIDER_SITE_OTHER): Payer: Self-pay

## 2018-10-13 VITALS — BP 129/78 | HR 72 | Temp 98.6°F | Wt 205.5 lb

## 2018-10-13 DIAGNOSIS — K5900 Constipation, unspecified: Secondary | ICD-10-CM | POA: Diagnosis not present

## 2018-10-13 DIAGNOSIS — M797 Fibromyalgia: Secondary | ICD-10-CM

## 2018-10-13 DIAGNOSIS — R1013 Epigastric pain: Secondary | ICD-10-CM | POA: Diagnosis not present

## 2018-10-13 DIAGNOSIS — R152 Fecal urgency: Secondary | ICD-10-CM

## 2018-10-13 DIAGNOSIS — F329 Major depressive disorder, single episode, unspecified: Secondary | ICD-10-CM | POA: Diagnosis not present

## 2018-10-13 DIAGNOSIS — Z791 Long term (current) use of non-steroidal anti-inflammatories (NSAID): Secondary | ICD-10-CM | POA: Diagnosis not present

## 2018-10-13 DIAGNOSIS — R159 Full incontinence of feces: Secondary | ICD-10-CM

## 2018-10-13 DIAGNOSIS — R197 Diarrhea, unspecified: Secondary | ICD-10-CM

## 2018-10-13 DIAGNOSIS — Z8742 Personal history of other diseases of the female genital tract: Secondary | ICD-10-CM | POA: Diagnosis not present

## 2018-10-13 DIAGNOSIS — R142 Eructation: Secondary | ICD-10-CM

## 2018-10-13 DIAGNOSIS — Z9049 Acquired absence of other specified parts of digestive tract: Secondary | ICD-10-CM | POA: Diagnosis not present

## 2018-10-13 DIAGNOSIS — R32 Unspecified urinary incontinence: Secondary | ICD-10-CM | POA: Insufficient documentation

## 2018-10-13 DIAGNOSIS — Z86010 Personal history of colonic polyps: Secondary | ICD-10-CM

## 2018-10-13 HISTORY — DX: Unspecified urinary incontinence: R32

## 2018-10-13 LAB — CBC WITH DIFFERENTIAL/PLATELET
Abs Immature Granulocytes: 0.06 10*3/uL (ref 0.00–0.07)
Basophils Absolute: 0.1 10*3/uL (ref 0.0–0.1)
Basophils Relative: 1 %
Eosinophils Absolute: 0.4 10*3/uL (ref 0.0–0.5)
Eosinophils Relative: 3 %
HCT: 39.9 % (ref 36.0–46.0)
Hemoglobin: 13.3 g/dL (ref 12.0–15.0)
Immature Granulocytes: 0 %
Lymphocytes Relative: 45 %
Lymphs Abs: 6.3 10*3/uL — ABNORMAL HIGH (ref 0.7–4.0)
MCH: 31.9 pg (ref 26.0–34.0)
MCHC: 33.3 g/dL (ref 30.0–36.0)
MCV: 95.7 fL (ref 80.0–100.0)
Monocytes Absolute: 0.7 10*3/uL (ref 0.1–1.0)
Monocytes Relative: 5 %
Neutro Abs: 6.6 10*3/uL (ref 1.7–7.7)
Neutrophils Relative %: 46 %
Platelets: 384 10*3/uL (ref 150–400)
RBC: 4.17 MIL/uL (ref 3.87–5.11)
RDW: 14 % (ref 11.5–15.5)
WBC: 14.2 10*3/uL — ABNORMAL HIGH (ref 4.0–10.5)
nRBC: 0 % (ref 0.0–0.2)

## 2018-10-13 LAB — COMPREHENSIVE METABOLIC PANEL
ALT: 21 U/L (ref 0–44)
AST: 24 U/L (ref 15–41)
Albumin: 4 g/dL (ref 3.5–5.0)
Alkaline Phosphatase: 89 U/L (ref 38–126)
Anion gap: 11 (ref 5–15)
BUN: 15 mg/dL (ref 6–20)
CO2: 23 mmol/L (ref 22–32)
Calcium: 9.3 mg/dL (ref 8.9–10.3)
Chloride: 104 mmol/L (ref 98–111)
Creatinine, Ser: 0.67 mg/dL (ref 0.44–1.00)
GFR calc Af Amer: >60 mL/min (ref >60)
GFR calc non Af Amer: >60 mL/min (ref >60)
Glucose, Bld: 96 mg/dL (ref 70–99)
Potassium: 3.4 mmol/L — ABNORMAL LOW (ref 3.5–5.1)
Sodium: 138 mmol/L (ref 135–145)
Total Bilirubin: 0.6 mg/dL (ref 0.3–1.2)
Total Protein: 7.4 g/dL (ref 6.5–8.1)

## 2018-10-13 LAB — C-REACTIVE PROTEIN: CRP: <0.8 mg/dL (ref <1.0)

## 2018-10-13 LAB — LIPASE, BLOOD: Lipase: 37 U/L (ref 11–51)

## 2018-10-13 NOTE — Assessment & Plan Note (Signed)
PHQ-9 today is 13. She states she is doing ok and does not want any medication changes at this time. She continues to miss her mother who passed away last year from gastric cancer and has depression from friends and family members passing away. Her brother and sister often fight as well which is stressful. Overall, she feels that she has a good support system, though. She has no SI and feels that she has a lot to live for.   - repeat PHQ-9 at f/u - It is unclear if she is still taking amitriptyline as her meds have expired and will contact her to discuss this.

## 2018-10-13 NOTE — Assessment & Plan Note (Addendum)
Two month history of intermittent fecal incontinence with loose stools. Last colonoscopy 09/2017 with one polyp, normal pathology. On PE she has some mild epigastric tenderness, no icterus or jaundice. She does not drink alcohol and had a cholecystectomy many years ago. Her symptoms may be consistent with IBS considering her fibromyalgia as well and intermittent constipation. Of note, family history is significant for pancreatic cancer in her brother and gastric cancer in her mother. No alarm symptoms at this time. She is eating and drinking well, vitals are stable and she appears euvolemic. Will obtain labs to rule out alternate etiologies and follow-up with results and symptoms at that time.   - CBC with diff, CRP, CMP, lipase  - f/u if symptoms worsen of fail to improve  - cont. prilosec   ADDENDUM: CBC with elevated white count, lymphocyte predominant, otherwise benign. Called to discuss results with her and she is having ongoing epigastric abdominal pain and belching. She states she really likes spicy foods. We also had discussed cessation of BC powders, but she has still been taking these frequently. She also has not been taking her prilosec. Considering her family history of gastric cancer and symptoms, I will place a referral to GI and refilled her prilosec 40 mg qd.

## 2018-10-13 NOTE — Patient Instructions (Signed)
Thank you for allowing [place] to provide your care today. Today we discussed your loose stools with incontinence.     I have ordered the following labs for you:  CBC, CMP, lipase, and inflammatory markers.    I will call with results and we will discuss follow-up then.   If you continue to have diarrhea, you can try over the counter Imodium as needed but try to limit use of this as it may cause constipation.     Please call the internal medicine center clinic if you have any questions or concerns, we may be able to help and keep you from a long and expensive emergency room wait. Our clinic and after hours phone number is [phone], the best time to call is Monday through Friday 9 am to 4 pm but there is always someone available 24/7 if you have an emergency. If you need medication refills please notify your pharmacy one week in advance and they will send [place] a request.

## 2018-10-13 NOTE — Progress Notes (Signed)
   CC: fecal incontinence  HPI:  [name] is a 58 y.o. with PMH as below presenting with loose stools and intermittent fecal incontinence for the past two months. She has been having 5-6 BM per day that are loose and orange with 5 episodes of loose stool. This seems to occur suddenly and she does not realize she is going to the bathroom on herself when it happens. No black stool or BRB. She denies LE numbness, acute weakness or saddle anesthesia. She has some mild epigastric pain, no changes in appetite, nausea or vomiting. She has been eating and drinking fluids without issue. She denies heart burn but has been belching. No recent GI illness, travel or sick contacts. She was treated 7/30 for bacterial vaginosis and yeast infection and completed metronidazole and fluconazole. She has been having intermittent urinary incontinence where she does not realize she has to use the bathroom, this just happens. She has no dysuria, sensation of still having to urinate after she finishes. This does not occur with laughing or coughing. No increase or decrease or other changes in urination that she has noticed. She denies cough, dizziness, diffuse or focal weakness, and SOB.   Please see A&P for assessment of the patient's acute and chronic medical conditions.  Past Medical History:  Diagnosis Date  . Anemia   . Anxiety   . Arthritis   . Atrophic endometrium 12/18/2016  . Fibromyalgia   . GERD (gastroesophageal reflux disease)   . Hypertension   . Jaundice    AGE 60 OR 21  . 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 Mitral valve problem   . Postmenopausal bleeding 12/31/2016  . Status post right foot surgery 04/12/2014  . Wears glasses   . Wears partial dentures    UPPER   Review of Systems:   ROS negative except as noted in HPI.   Physical Exam:  Constitution: NAD, well dressed and nourished  Eyes: no icterus or injection Cardio: RRR, no m/r/g  Abdominal: soft, non-distended, mild  epigastric/periumbilical tenderness Skin: c/d/i, no jaundice    Vitals:   10/13/18 1453  BP: 129/78  Pulse: 72  Temp: 98.6 F (37 C)  TempSrc: Oral  SpO2: 100%  Weight: 205 lb 8 oz (93.2 kg)    Assessment & Plan:   See Encounters Tab for problem based charting.  Patient discussed with Dr. Evette Doffing

## 2018-10-13 NOTE — Assessment & Plan Note (Addendum)
She has been having intermittent urinary incontinence where she does not realize she has to use the bathroom, this just happens. She has no dysuria, sensation of still having to urinate after she finishes. This does not occur with laughing or coughing. No increase or decrease or other changes in urination that she has noticed. She was recently treated for with metro and fluconazole for BV and yeast infection.  - CMP - patient left without completing UA - will discuss if symptoms have improved once labs return. If not, will have her return for UA and post void residual.

## 2018-10-14 NOTE — Progress Notes (Signed)
Internal Medicine Clinic Attending  Case discussed with Dr. Seawell at the time of the visit.  We reviewed the resident's history and exam and pertinent patient test results.  I agree with the assessment, diagnosis, and plan of care documented in the resident's note.    

## 2018-10-18 ENCOUNTER — Other Ambulatory Visit: Payer: Self-pay | Admitting: Internal Medicine

## 2018-10-18 DIAGNOSIS — R1013 Epigastric pain: Secondary | ICD-10-CM

## 2018-10-18 MED ORDER — OMEPRAZOLE 40 MG PO CPDR: 40.0000 mg | DELAYED_RELEASE_CAPSULE | Freq: Every day | ORAL | 3 refills | Status: DC

## 2018-11-02 ENCOUNTER — Ambulatory Visit: Payer: Medicaid Other | Admitting: Podiatry

## 2018-11-08 ENCOUNTER — Other Ambulatory Visit: Payer: Self-pay

## 2018-11-08 ENCOUNTER — Ambulatory Visit (INDEPENDENT_AMBULATORY_CARE_PROVIDER_SITE_OTHER): Payer: Medicaid Other | Admitting: Gastroenterology

## 2018-11-08 ENCOUNTER — Encounter: Payer: Self-pay | Admitting: Gastroenterology

## 2018-11-08 VITALS — BP 128/74 | HR 70 | Temp 98.2°F | Ht 66.0 in | Wt 204.0 lb

## 2018-11-08 DIAGNOSIS — Z800 Family history of malignant neoplasm of digestive organs: Secondary | ICD-10-CM | POA: Diagnosis not present

## 2018-11-08 DIAGNOSIS — R1013 Epigastric pain: Secondary | ICD-10-CM | POA: Diagnosis not present

## 2018-11-08 MED ORDER — OMEPRAZOLE 40 MG PO CPDR: 40.0000 mg | DELAYED_RELEASE_CAPSULE | Freq: Two times a day (BID) | ORAL | 1 refills | Status: DC

## 2018-11-08 NOTE — Patient Instructions (Addendum)
We have sent the following medications to your pharmacy for you to pick up at your convenience:  Omeprazole 40 mg twice a day   You have been scheduled for an endoscopy. Please follow written instructions given to you at your visit today. If you use inhalers (even only as needed), please bring them with you on the day of your procedure.  Due to recent COVID-19 restrictions implemented by [employer] and state authorities and in an effort to keep both patients and staff as safe as possible, [facility] requires COVID-19 testing prior to any scheduled endoscopic procedure. The testing center is located at [address] [place], [place] [address] in the [facility] [employer]  suite.  Your appointment has been scheduled for 11-22-2018 at 9:00 am.   Please bring your insurance cards to this appointment. You will require your COVID screen 2 business days prior to your endoscopic procedure.  You are not required to quarantine after your screening.  You will only receive a phone call with the results if it is POSITIVE.  If you do not receive a call the day before your procedure you should begin your prep, if ordered, and you should report to the endo center for your procedure at your designated appointment arrival time ( one hour prior to the procedure time). There is no cost to you for the screening on the day of the swab.  [facility] will file with your insurance company for the testing.    You may receive an automated phone call prior to your procedure or have a message in your MyChart that you have an appointment for a BP/15 at the [facility], please disregard this message.  Your testing will be at the [place] location.   If you are leaving [facility] travel [place] on [address], turn left onto [address], turn night onto [address] at the  1st stop light turn right, pass the [employer] on your right and proceed to [place] (white building).

## 2018-11-08 NOTE — Progress Notes (Signed)
Referring Provider: Marty Heck, DO Primary Care Physician:  Marty Heck, DO  Reason for Consultation:  Epigastric pain  IMPRESSION:  Epigastric pain without alarm features Frequent use of BC powders Family history of gastric cancer (mother) Family history of pancreatic cancer (brother)  Epigastric pain without alarm features although there is a family history of gastric cancer in a first-degree relative. The differential for upper abdominal pain includes esophagitis, esophageal motility disorders, functional dyspepsia, gastroparesis, gastritis, duodenitis, functional abdominal pain, pancreatic and/or hepatobiliary disorders, and malignancy.  Given this differential I am recommending that she increase her omeprazole to 40 mg twice daily for 8 weeks.  I have also recommended an upper endoscopy with esophageal and gastric biopsies.   PLAN: Increase omeprazole to 40 mg BID x 8 weeks EGD Low threshold to consider cross-sectional abdominal imaging  I consented the patient discussing the risks, benefits, and alternatives to endoscopic evaluation. In particular, we discussed the risks that include, but are not limited to, reaction to medication, cardiopulmonary compromise, bleeding requiring blood transfusion, aspiration resulting in pneumonia, perforation requiring surgery, lack of diagnosis, severe illness requiring hospitalization, and even death. We reviewed the risk of missed lesion including polyps or even cancer. The patient acknowledges these risks and asks that we proceed.  Please see the "Patient Instructions" section for addition details about the plan.  HPI: Sandra Church is a 58 y.o. female who self-referred for epigastric pain. The history is obtained through the patient and review of her electronic health record.  She has fibromyalgia. I met her for a screening colonoscopy 10/08/17. She had a 1 mm hyperplastic polyp in the transverse colon and left-sided mild  diverticulosis.  Epigastric and chest pain. Controlled on PPI when she started taking it regularly. Worries that it's gas and stress. No dysphagia or odynophagia. Has significant eructation.  Wakes with a bad taste in her mouth. No blood in the stool. Fluctuating diarrhea and constipation depending on what she eats.  She has been using BC powders. No other associated symptoms. No identified exacerbating or relieving features.   No evidence for GI bleeding, iron deficiency anemia, anorexia, unexplained weight loss, dysphagia, odynophagia, persistent vomiting.  Sandra Church is the oldest of 6 children. Over the last 8 months she has had multiple family members die. Brother died of pancreatic cancer at age 23, mother with stomach and esophageal cancer, uncle (fell off a deck and broke his neck), and cousin (breast cancer).  No known family history of colon cancer or polyps.   No prior history of upper endoscopy. No recent abdominal imaging.    Past Medical History:  Diagnosis Date  . Anemia   . Anxiety   . Arthritis   . Atrophic endometrium 12/18/2016  . Fibromyalgia   . GERD (gastroesophageal reflux disease)   . Hypertension   . Jaundice    AGE 55 OR 21  . 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 Mitral valve problem   . Postmenopausal bleeding 12/31/2016  . Status post right foot surgery 04/12/2014  . Wears glasses   . Wears partial dentures    UPPER    Past Surgical History:  Procedure Laterality Date  . CLOSED MANIPULATION KNEE WITH STERIOD INJECTION  2011   rt  . COLONOSCOPY  AT AGE 52-normal exam   [place]  . [place] Osteotomy with Bone Graft Right 02/23/2014   Rt foot @ [facility]  . DIAGNOSTIC LAPAROSCOPY    . FOOT BONE EXCISION     both feet-spurs  .  FOOT SURGERY     plantar fascitis  . HYSTEROSCOPY W/D&C N/A 12/18/2016   Procedure: DILATATION AND CURETTAGE /HYSTEROSCOPY;  Surgeon: Janyth Contes, MD;  Location: [place];  Service: Gynecology;  Laterality:  N/A;  . JOINT REPLACEMENT  2011   rt total knee replacement  . KNEE ARTHROPLASTY  2012   rt  . OPEN REDUCTION INTERNAL FIXATION (ORIF) METACARPAL Left 07/06/2012   Procedure: LEFT HAND EXPLORATION WOUNDS, OPEN REDUCTION INTERNAL FIXATION SMALL METACARPAL FRACTURE;  Surgeon: Tennis Must, MD;  Location: [place];  Service: Orthopedics;  Laterality: Left;  . REPAIR EXTENSOR TENDON Left 07/06/2012   Procedure: REPAIR EXTENSOR TENDON;  Surgeon: Tennis Must, MD;  Location: [place];  Service: Orthopedics;  Laterality: Left;  Left   . SHOULDER ARTHROSCOPY WITH DISTAL CLAVICLE RESECTION Left 01/24/2016   Procedure: SHOULDER ARTHROSCOPY WITH DISTAL CLAVICLE RESECTION;  Surgeon: [name], MD;  Location: [place];  Service: Orthopedics;  Laterality: Left;  . SHOULDER ARTHROSCOPY WITH ROTATOR CUFF REPAIR AND SUBACROMIAL DECOMPRESSION Left 01/24/2016   Procedure: LEFT SHOULDER ARTHROSCOPY ACROMIOPLASTY, CLAVICLECTOMY, ARTHROSCOPIC ROTATOR CUFF REPAIR;  Surgeon: [name], MD;  Location: [place];  Service: Orthopedics;  Laterality: Left;  . SHOULDER SURGERY  2010   rt    Current Outpatient Medications  Medication Sig Dispense Refill  . amitriptyline (ELAVIL) 10 MG tablet TAKE 1 TABLET BY MOUTH EVERYDAY AT BEDTIME 90 tablet 0  . amLODipine (NORVASC) 5 MG tablet Take 5 mg by mouth every morning.   3  . cyclobenzaprine (FLEXERIL) 5 MG tablet TAKE 1 TABLET BY MOUTH THREE TIMES A DAY AS NEEDED FOR MUSCLE SPASMS 60 tablet 0  . diclofenac sodium (VOLTAREN) 1 % GEL Apply 2 g topically 4 (four) times daily. 1 Tube 1  . diphenhydrAMINE (BENADRYL) 25 mg capsule Take 1 capsule (25 mg total) by mouth every 6 (six) hours as needed. (Patient taking differently: Take 25 mg by mouth every 6 (six) hours as needed for allergies. ) 30 capsule 0  . metoprolol succinate (TOPROL-XL) 100 MG 24 hr tablet Take 100 mg by mouth every morning. Take with  or immediately following a meal.    . Multiple Vitamins-Minerals (MULTIVITAMIN GUMMIES ADULT PO) Take by mouth.    . olmesartan (BENICAR) 40 MG tablet Take 40 mg by mouth daily.    Marland Kitchen omeprazole (PRILOSEC) 40 MG capsule Take 1 capsule (40 mg total) by mouth daily. 30 capsule 3  . rosuvastatin (CRESTOR) 10 MG tablet Take 10 mg by mouth daily.  3   No current facility-administered medications for this visit.     Allergies as of 11/08/2018 - Review Complete 11/08/2018  Allergen Reaction Noted  . Shrimp [shellfish allergy] Anaphylaxis, Shortness Of Breath, and Swelling 10/14/2011  . Codeine Itching 10/14/2011  . Shea butter  12/12/2016    Family History  Problem Relation Age of Onset  . Diabetes Mother   . Stroke Mother   . Stomach cancer Mother   . Esophageal cancer Mother   . Cancer Father   . Hypertension Father   . Pancreatic cancer Brother   . Breast cancer Cousin   . Colon cancer Neg Hx   . Colon polyps Neg Hx     Social History   Socioeconomic History  . Marital status: Single    Spouse name: Not on file  . Number of children: Not on file  . Years of education: Not on file  .  Highest education level: Not on file  Occupational History  . Not on file  Social Needs  . Financial resource strain: Not on file  . Food insecurity    Worry: Not on file    Inability: Not on file  . Transportation needs    Medical: Not on file    Non-medical: Not on file  Tobacco Use  . Smoking status: Never Smoker  . Smokeless tobacco: Never Used  Substance and Sexual Activity  . Alcohol use: No    Alcohol/week: 0.0 standard drinks    Frequency: Never  . Drug use: No  . Sexual activity: Not on file  Lifestyle  . Physical activity    Days per week: Not on file    Minutes per session: Not on file  . Stress: Not on file  Relationships  . Social Herbalist on phone: Not on file    Gets together: Not on file    Attends religious service: Not on file    Active member of  club or organization: Not on file    Attends meetings of clubs or organizations: Not on file    Relationship status: Not on file  . Intimate partner violence    Fear of current or ex partner: Not on file    Emotionally abused: Not on file    Physically abused: Not on file    Forced sexual activity: Not on file  Other Topics Concern  . Not on file  Social History Narrative  . Not on file    Review of Systems: 12 system ROS is negative except as noted above.   Physical Exam: General:   Alert,  well-nourished, pleasant and cooperative in NAD Head:  Normocephalic and atraumatic. Eyes:  Sclera clear, no icterus.   Conjunctiva pink. Ears:  Normal auditory acuity. Nose:  No deformity, discharge,  or lesions. Mouth:  No deformity or lesions.   Neck:  Supple; no masses or thyromegaly. Lungs:  Clear throughout to auscultation.   No wheezes. Heart:  Regular rate and rhythm; no murmurs. Abdomen:  Soft,nontender, nondistended, normal bowel sounds, no rebound or guarding. No hepatosplenomegaly.   Rectal:  Deferred  Msk:  Symmetrical. No boney deformities LAD: No inguinal or umbilical LAD Extremities:  No clubbing or edema. Neurologic:  Alert and  oriented x4;  grossly nonfocal Skin:  Intact without significant lesions or rashes. Psych:  Alert and cooperative. Normal mood and affect.  Korey Prashad L. Tarri Glenn, MD, MPH 11/08/2018, 11:34 AM

## 2018-11-09 DIAGNOSIS — M48061 Spinal stenosis, lumbar region without neurogenic claudication: Secondary | ICD-10-CM | POA: Diagnosis not present

## 2018-11-09 DIAGNOSIS — I10 Essential (primary) hypertension: Secondary | ICD-10-CM | POA: Diagnosis not present

## 2018-11-09 DIAGNOSIS — M545 Low back pain: Secondary | ICD-10-CM | POA: Diagnosis not present

## 2018-11-09 DIAGNOSIS — Z6833 Body mass index (BMI) 33.0-33.9, adult: Secondary | ICD-10-CM | POA: Diagnosis not present

## 2018-11-09 NOTE — Addendum Note (Signed)
Addended by: Wyline Beady on: 11/09/2018 08:46 AM   Modules accepted: Orders

## 2018-11-22 ENCOUNTER — Other Ambulatory Visit: Payer: Self-pay | Admitting: Gastroenterology

## 2018-11-22 DIAGNOSIS — Z1159 Encounter for screening for other viral diseases: Secondary | ICD-10-CM | POA: Diagnosis not present

## 2018-11-23 LAB — SARS CORONAVIRUS 2 (TAT 6-24 HRS): SARS Coronavirus 2: NEGATIVE

## 2018-11-24 ENCOUNTER — Encounter: Payer: Self-pay | Admitting: Gastroenterology

## 2018-11-24 ENCOUNTER — Encounter: Payer: Medicaid Other | Admitting: Gastroenterology

## 2018-11-24 ENCOUNTER — Ambulatory Visit (AMBULATORY_SURGERY_CENTER): Payer: Medicaid Other | Admitting: Gastroenterology

## 2018-11-24 ENCOUNTER — Other Ambulatory Visit: Payer: Self-pay

## 2018-11-24 ENCOUNTER — Other Ambulatory Visit: Payer: Self-pay | Admitting: Gastroenterology

## 2018-11-24 VITALS — BP 112/17 | HR 78 | Temp 98.1°F | Resp 18 | Ht 66.0 in | Wt 204.0 lb

## 2018-11-24 DIAGNOSIS — R1013 Epigastric pain: Secondary | ICD-10-CM

## 2018-11-24 DIAGNOSIS — B9681 Helicobacter pylori [H. pylori] as the cause of diseases classified elsewhere: Secondary | ICD-10-CM | POA: Diagnosis not present

## 2018-11-24 DIAGNOSIS — Z800 Family history of malignant neoplasm of digestive organs: Secondary | ICD-10-CM | POA: Diagnosis not present

## 2018-11-24 DIAGNOSIS — I10 Essential (primary) hypertension: Secondary | ICD-10-CM | POA: Diagnosis not present

## 2018-11-24 DIAGNOSIS — M797 Fibromyalgia: Secondary | ICD-10-CM | POA: Diagnosis not present

## 2018-11-24 DIAGNOSIS — K219 Gastro-esophageal reflux disease without esophagitis: Secondary | ICD-10-CM | POA: Diagnosis not present

## 2018-11-24 DIAGNOSIS — K2950 Unspecified chronic gastritis without bleeding: Secondary | ICD-10-CM | POA: Diagnosis not present

## 2018-11-24 MED ORDER — SODIUM CHLORIDE 0.9 % IV SOLN: 500.0000 mL | INTRAVENOUS | Status: DC

## 2018-11-24 NOTE — Patient Instructions (Addendum)
YOU HAD AN ENDOSCOPIC PROCEDURE TODAY AT [facility]:   Refer to the procedure report that was given to you for any specific questions about what was found during the examination.  If the procedure report does not answer your questions, please call your gastroenterologist to clarify.  If you requested that your care partner not be given the details of your procedure findings, then the procedure report has been included in a sealed envelope for you to review at your convenience later.  YOU SHOULD EXPECT: Some feelings of bloating in the abdomen. Passage of more gas than usual.  Walking can help get rid of the air that was put into your GI tract during the procedure and reduce the bloating. If you had a lower endoscopy (such as a colonoscopy or flexible sigmoidoscopy) you may notice spotting of blood in your stool or on the toilet paper. If you underwent a bowel prep for your procedure, you may not have a normal bowel movement for a few days.  Please Note:  You might notice some irritation and congestion in your nose or some drainage.  This is from the oxygen used during your procedure.  There is no need for concern and it should clear up in a day or so.  SYMPTOMS TO REPORT IMMEDIATELY:    Following upper endoscopy (EGD)  Vomiting of blood or coffee ground material  New chest pain or pain under the shoulder blades  Painful or persistently difficult swallowing  New shortness of breath  Fever of 100F or higher  Black, tarry-looking stools  For urgent or emergent issues, a gastroenterologist can be reached at any hour by calling [phone].   DIET:  We do recommend a small meal at first, but then you may proceed to your regular diet.  Drink plenty of fluids but you should avoid alcoholic beverages for 24 hours.  ACTIVITY:  You should plan to take it easy for the rest of today and you should NOT DRIVE or use heavy machinery until tomorrow (because of the sedation medicines used  during the test).    FOLLOW UP: Our staff will call the number listed on your records 48-72 hours following your procedure to check on you and address any questions or concerns that you may have regarding the information given to you following your procedure. If we do not reach you, we will leave a message.  We will attempt to reach you two times.  During this call, we will ask if you have developed any symptoms of COVID 19. If you develop any symptoms (ie: fever, flu-like symptoms, shortness of breath, cough etc.) before then, please call [phone].  If you test positive for Covid 19 in the 2 weeks post procedure, please call and report this information to [place].    If any biopsies were taken you will be contacted by phone or by letter within the next 1-3 weeks.  Please call us at [phone] if you have not heard about the biopsies in 3 weeks.    SIGNATURES/CONFIDENTIALITY: You and/or your care partner have signed paperwork which will be entered into your electronic medical record.  These signatures attest to the fact that that the information above on your After Visit Summary has been reviewed and is understood.  Full responsibility of the confidentiality of this discharge information lies with you and/or your care-partner.    Handouts were given to you Gastritis and Gastric Ulcers. AVOID all NSAIDS including BC Powder. Continue present meds including  OMEPRAZOLE 40 mg twice daily until the next endoscopy. Repeat Upper Endoscopy in 8 - 10 weeks to check for healing ulcer.  The schedule is not out that far.  We will call or send you a letter when time to reschedule. You may resume your current medications today. Await biopsy results. Please call if any questions or concerns.

## 2018-11-24 NOTE — Progress Notes (Signed)
Called to room to assist during endoscopic procedure.  Patient ID and intended procedure confirmed with present staff. Received instructions for my participation in the procedure from the performing physician.  

## 2018-11-24 NOTE — Op Note (Addendum)
[facility] Patient Name: Sandra Church Procedure Date: 11/24/2018 8:27 AM MRN: [id number] Endoscopist: Thornton Park MD, MD Age: 58 Referring MD:  Date of Birth: 09/21/1960 Gender: Female Account #: [id number] Procedure:                Upper GI endoscopy Indications:              Epigastric pain                           Frequent use of BC powders                           Family history of gastric cancer (mother)                           Family history of pancreatic cancer (brother) Medicines:                Monitored Anesthesia Care Procedure:                Pre-Anesthesia Assessment:                           - Prior to the procedure, a History and Physical                            was performed, and patient medications and                            allergies were reviewed. The patient's tolerance of                            previous anesthesia was also reviewed. The risks                            and benefits of the procedure and the sedation                            options and risks were discussed with the patient.                            All questions were answered, and informed consent                            was obtained. Prior Anticoagulants: The patient has                            taken no previous anticoagulant or antiplatelet                            agents. ASA Grade Assessment: III - A patient with                            severe systemic disease. After reviewing the risks  and benefits, the patient was deemed in                            satisfactory condition to undergo the procedure.                           After obtaining informed consent, the endoscope was                            passed under direct vision. Throughout the                            procedure, the patient's blood pressure, pulse, and                            oxygen saturations were monitored continuously. The               Endoscope was introduced through the mouth, and                            advanced to the third part of duodenum. The upper                            GI endoscopy was accomplished without difficulty.                            The patient tolerated the procedure well. Scope In: Scope Out: Findings:                 The esophagus was normal.                           Two non-bleeding gastric ulcers were present in the                            antrum. There is associated deformity with the                            ulcers proximal to the pylorus. The largest lesion                            was a linear 10 mm ulcer. The smaller is a 70mm                            superficial ulcer. Biopsies were taken with a cold                            forceps for histology. Estimated blood loss was                            minimal.                           Diffuse mild inflammation was found in the gastric  body. Biopsies were taken from the antrum and the                            body and fundus with a cold forceps for histology.                           The examined duodenum was normal.                           The cardia and gastric fundus were normal on                            retroflexion. Complications:            No immediate complications. Estimated blood loss:                            Minimal. Estimated Blood Loss:     Estimated blood loss was minimal. Impression:               - Normal esophagus.                           - Non-bleeding gastric ulcers with no stigmata of                            bleeding. This is the likely cause of the abdominal                            pain. These ulcers are likely due to [facility] powder.                            Biopsied.                           - Gastritis. Biopsied to evaluate for H pylori.                           - Normal examined duodenum. Recommendation:           - Patient has a contact  number available for                            emergencies. The signs and symptoms of potential                            delayed complications were discussed with the                            patient. Return to normal activities tomorrow.                            Written discharge instructions were provided to the                            patient.                           -  Resume previous diet today.                           - Continue present medications. Continue omeprazole                            40 mg twice daily until the next endoscopy.                           - Await pathology results.                           - Avoid all NSAIDs including BC powder. Please talk                            to your doctor about alternative treatment options                            for your sciatica pain.                           - Repeat upper endoscopy in 8-10 weeks to check                            healing. Thornton Park MD, MD 11/24/2018 8:47:49 AM This report has been signed electronically.

## 2018-11-24 NOTE — Progress Notes (Signed)
Pt needs to repeat upper endoscopy in 8-10 weeks to check healing.  The schedule is not out that far.  Will put in the c40mputer as a recall EGD.  No problems noted in the recovery room. maw

## 2018-11-24 NOTE — Progress Notes (Signed)
Report to PACU, RN, vss, BBS= Clear.  

## 2018-11-26 ENCOUNTER — Other Ambulatory Visit: Payer: Self-pay | Admitting: *Deleted

## 2018-11-26 ENCOUNTER — Telehealth: Payer: Self-pay

## 2018-11-26 MED ORDER — AMOXICILLIN 500 MG PO TABS: 1000.0000 mg | ORAL_TABLET | Freq: Two times a day (BID) | ORAL | 0 refills | Status: AC

## 2018-11-26 MED ORDER — CLARITHROMYCIN 500 MG PO TABS: 500.0000 mg | ORAL_TABLET | Freq: Two times a day (BID) | ORAL | 0 refills | Status: AC

## 2018-11-26 NOTE — Telephone Encounter (Signed)
  Follow up Call-  Call back number 11/24/2018 10/08/2017  Post procedure Call Back phone  # [phone]  Permission to leave phone message Yes Yes  Some recent data might be hidden     Patient questions:  Do you have a fever, pain , or abdominal swelling? No. Pain Score  0 *  Have you tolerated food without any problems? Yes.    Have you been able to return to your normal activities? Yes.    Do you have any questions about your discharge instructions: Diet   No. Medications  No. Follow up visit  No.  Do you have questions or concerns about your Care? No.  Actions: * If pain score is 4 or above: No action needed, pain <4.  Have you developed a fever since your procedure? No 2.   Have you had an respiratory symptoms (SOB or cough) since your procedure? No  3.   Have you tested positive for COVID 19 since your procedure No  4.   Have you had any family members/close contacts diagnosed with the COVID 19 since your procedure?  No   If yes to any of these questions please route to Joylene John, RN and Alphonsa Gin, RN.

## 2018-12-16 DIAGNOSIS — M545 Low back pain: Secondary | ICD-10-CM | POA: Diagnosis not present

## 2018-12-22 ENCOUNTER — Other Ambulatory Visit: Payer: Self-pay | Admitting: Internal Medicine

## 2018-12-22 DIAGNOSIS — M797 Fibromyalgia: Secondary | ICD-10-CM

## 2018-12-29 ENCOUNTER — Ambulatory Visit: Payer: Medicaid Other | Admitting: Gastroenterology

## 2018-12-30 ENCOUNTER — Telehealth: Payer: Self-pay | Admitting: *Deleted

## 2018-12-30 NOTE — Telephone Encounter (Signed)
Left message for the patient to call back to schedule repeat upper EGD mid to late January.

## 2018-12-31 ENCOUNTER — Other Ambulatory Visit: Payer: Self-pay | Admitting: *Deleted

## 2018-12-31 ENCOUNTER — Telehealth: Payer: Self-pay | Admitting: *Deleted

## 2018-12-31 DIAGNOSIS — R1013 Epigastric pain: Secondary | ICD-10-CM

## 2018-12-31 DIAGNOSIS — Z1159 Encounter for screening for other viral diseases: Secondary | ICD-10-CM

## 2018-12-31 DIAGNOSIS — A048 Other specified bacterial intestinal infections: Secondary | ICD-10-CM

## 2018-12-31 NOTE — Telephone Encounter (Signed)
Spoke to the patient who has been scheduled for her repeat EGD. Following appts scheduled with patient:   02/02/19 at 8 am COVID screening (orders in Epic)  02/04/19 at 8 am repeat EGD (ambulatory referral in [place])

## 2019-01-11 DIAGNOSIS — Z6832 Body mass index (BMI) 32.0-32.9, adult: Secondary | ICD-10-CM | POA: Diagnosis not present

## 2019-01-11 DIAGNOSIS — I10 Essential (primary) hypertension: Secondary | ICD-10-CM | POA: Diagnosis not present

## 2019-01-17 ENCOUNTER — Ambulatory Visit (HOSPITAL_COMMUNITY)
Admission: RE | Admit: 2019-01-17 | Discharge: 2019-01-17 | Disposition: A | Discharge status: Home / Self Care | Payer: Medicaid Other | Source: Ambulatory Visit | Attending: Internal Medicine | Admitting: Internal Medicine

## 2019-01-17 ENCOUNTER — Other Ambulatory Visit: Payer: Self-pay

## 2019-01-17 ENCOUNTER — Encounter: Payer: Self-pay | Admitting: Internal Medicine

## 2019-01-17 ENCOUNTER — Ambulatory Visit: Payer: Medicaid Other | Admitting: Internal Medicine

## 2019-01-17 VITALS — BP 126/83 | HR 66 | Temp 98.0°F | Ht 66.0 in | Wt 203.6 lb

## 2019-01-17 DIAGNOSIS — M25532 Pain in left wrist: Secondary | ICD-10-CM

## 2019-01-17 DIAGNOSIS — M654 Radial styloid tenosynovitis [de Quervain]: Secondary | ICD-10-CM

## 2019-01-17 HISTORY — DX: Radial styloid tenosynovitis (de quervain): M65.4

## 2019-01-17 MED ORDER — THUMB SPLINT/LEFT MEDIUM MISC: 0 refills | Status: DC

## 2019-01-17 NOTE — Patient Instructions (Signed)
Thank you for allowing [place] to provide your care today. Today we discussed your left wrist pain.   Please START using a thumb spica splint for your hand pain. You do not need to wear this constantly but as you are able. If your symptoms do not improve, please call the clinic to see if you can schedule a steroid injection or for a referral.    Please call the internal medicine center clinic if you have any questions or concerns, we may be able to help and keep you from a long and expensive emergency room wait. Our clinic and after hours phone number is [phone], the best time to call is Monday through Friday 9 am to 4 pm but there is always someone available 24/7 if you have an emergency. If you need medication refills please notify your pharmacy one week in advance and they will send [place] a request.

## 2019-01-17 NOTE — Progress Notes (Signed)
   CC: hand pain  HPI:  Ms.Kamiya L Tines is a 59 y.o. with PMH as below.   Please see A&P for assessment of the patient's acute and chronic medical conditions.   She has been having left wrist pain for the past two months. She denies any trauma to the hand or wrist. She denies numbness. She has pain along the radial aspect of the wrist which hurts most with ulnar flexion. She is unable to take NSAIDs due to dx of ulcers. She has been alternating tylenol and ice, which has helped slightly. She has been using a splint which does not immobilize her wrist very well.    Past Medical History:  Diagnosis Date  . Anemia   . Anxiety   . Arthritis   . Atrophic endometrium 12/18/2016  . Fibromyalgia   . GERD (gastroesophageal reflux disease)   . Hypertension   . Jaundice    AGE 72 OR 21  . 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 Mitral valve problem   . Postmenopausal bleeding 12/31/2016  . Status post right foot surgery 04/12/2014  . Wears glasses   . Wears partial dentures    UPPER   Review of Systems:   10 point ROS negative except as noted in HPI  Physical Exam:  Constitution: NAD, appears stated age  Respiratory: non-labored breathing, on RA MSK: +left finkelstein's, swelling left wrist, TTP of radial wrist  Neuro: normal affect, a&ox3 Skin: c/d/i    Vitals:   01/17/19 1024 01/17/19 1025  BP:  126/83  Pulse:  66  Temp:  98 F (36.7 C)  TempSrc:  Oral  SpO2:  99%  Weight: 203 lb 9.6 oz (92.4 kg)   Height: 5\' 6"  (1.676 m)      Assessment & Plan:   See Encounters Tab for problem based charting.  Patient discussed with Dr. Philipp Ovens

## 2019-01-17 NOTE — Progress Notes (Signed)
Internal Medicine Clinic Attending  Case discussed with Dr. Seawell at the time of the visit.  We reviewed the resident's history and exam and pertinent patient test results.  I agree with the assessment, diagnosis, and plan of care documented in the resident's note.    

## 2019-01-17 NOTE — Assessment & Plan Note (Signed)
Symptoms consistent with de quervain's tenosynovitis with sharp increase in pain with Finkelstein's test.   - xr to rule out other fracture or etiology  - continue to alternate ice and tylenol, voltaren gel prn  - thumb spica splint DME  - f/u if symptoms worsen or fail to improve for steroid injection here or referral - avoid NSAIDs

## 2019-01-18 NOTE — Progress Notes (Signed)
Called patient and discussed results

## 2019-01-24 ENCOUNTER — Encounter: Payer: Self-pay | Admitting: Gastroenterology

## 2019-01-24 ENCOUNTER — Ambulatory Visit (INDEPENDENT_AMBULATORY_CARE_PROVIDER_SITE_OTHER): Payer: Medicaid Other | Admitting: Gastroenterology

## 2019-01-24 VITALS — BP 126/76 | HR 68 | Temp 96.7°F | Wt 200.1 lb

## 2019-01-24 DIAGNOSIS — K259 Gastric ulcer, unspecified as acute or chronic, without hemorrhage or perforation: Secondary | ICD-10-CM | POA: Diagnosis not present

## 2019-01-24 DIAGNOSIS — R1013 Epigastric pain: Secondary | ICD-10-CM | POA: Diagnosis not present

## 2019-01-24 DIAGNOSIS — Z800 Family history of malignant neoplasm of digestive organs: Secondary | ICD-10-CM

## 2019-01-24 DIAGNOSIS — Z01818 Encounter for other preprocedural examination: Secondary | ICD-10-CM | POA: Diagnosis not present

## 2019-01-24 DIAGNOSIS — B9681 Helicobacter pylori [H. pylori] as the cause of diseases classified elsewhere: Secondary | ICD-10-CM

## 2019-01-24 NOTE — Patient Instructions (Addendum)
-   Continue omeprazole 40 mg BID - EGD 02/04/2019 - Continue to avoid all NSAIDs - Low threshold to consider cross-sectional abdominal imaging if symptoms persist and follow-up EGD is negative - Colonoscopy 09/2027  The nature of the procedure, as well as the risks, benefits, and alternatives were carefully and thoroughly reviewed with the patient. Ample time for discussion and questions allowed. The patient understood, was satisfied, and agreed to proceed.  Please see the "Patient Instructions" section for addition details about the plan.   If you are age 65 or older, your body mass index should be between 23-30. Your Body mass index is 32.3 kg/m. If this is out of the aforementioned range listed, please consider follow up with your Primary Care Provider.  If you are age 29 or younger, your body mass index should be between 19-25. Your Body mass index is 32.3 kg/m. If this is out of the aformentioned range listed, please consider follow up with your Primary Care Provider.   Due to recent changes in healthcare laws, you may see the results of your imaging and laboratory studies on MyChart before your provider has had a chance to review them.  We understand that in some cases there may be results that are confusing or concerning to you. Not all laboratory results come back in the same time frame and the provider may be waiting for multiple results in order to interpret others.  Please give [place] 48 hours in order for your provider to thoroughly review all the results before contacting the office for clarification of your results.

## 2019-01-24 NOTE — Progress Notes (Signed)
Referring Provider: Marty Heck, DO Primary Care Physician:  Marty Heck, DO  Chief complaint:  Epigastric pain  IMPRESSION:  H. pylori and NSAID associated gastric ulcers and gastritis on EGD 11/24/2018     - presenting with epigastric and chest pain  History of frequent use of BC powders, none since gastric ulcer diagnosis Family history of gastric cancer (mother) Family history of pancreatic cancer (brother) Sigmoid diverticulosis Hyperplastic polyp on screening colonoscopy 09/2017    - screening recommended 2029  EGD recommended to document resolution of gastric ulcers, particularly given lingering symptoms and family history of gastric cancer in her mother. Continue omeprazole 40 mg BID until the time of EGD.   Reviewed colonoscopy reports from 09/2017. No first degree family members with colon cancer. Next exam recommended in 10 years.    PLAN: - Continue omeprazole 40 mg BID - EGD 02/04/2019 - Continue to avoid all NSAIDs - Low threshold to consider cross-sectional abdominal imaging if symptoms persist and follow-up EGD is negative - Colonoscopy 09/2027  The nature of the procedure, as well as the risks, benefits, and alternatives were carefully and thoroughly reviewed with the patient. Ample time for discussion and questions allowed. The patient understood, was satisfied, and agreed to proceed.  Please see the "Patient Instructions" section for addition details about the plan.  HPI: Sandra Church is a 59 y.o. female who self-referred for epigastric pain.  She was seen in consult 11/08/2018 and had an upper endoscopy 11/24/2018.  She returns in scheduled follow-up.  The interval history is obtained through the patient and review of her electronic health record.  She has fibromyalgia.   Seen in consultation in October for epigastric and chest pain with associated irritation. Controlled on PPI when she started taking it regularly. Fluctuating diarrhea and  constipation depending on what she eats.  She has been using BC powders.  EGD 11/24/2018 showed 2 nonbleeding gastric ulcers in the antrum with associated deformity.  The largest lesion was 10 mm the smaller lesion was 5 mm.  There was also diffuse gastritis.  Biopsies confirmed H. pylori gastritis.  H. pylori was treated with 10 days of amoxicillin 1 g twice daily, clarithromycin 500 mg twice daily, and omeprazole 40 mg twice daily.  I recommended that she avoid all NSAIDs including BC powder.  Repeat upper endoscopy recommended in 8 to 10 weeks to check for healing.  Her abdominal pain has largely resolved, although some chest pain/tightness lingers, most recently last week. She stopped using all BC powder. Now using generic Tylenol and heating pads.  No dysphagia or odynophagia. No change in bowel habits or blood in the stool.   Her most recent labs from 10/13/2018 showed no anemia.  Hemoglobin 13.3, MCV 95.7, RDW 14, platelets 384.  Maternal uncle with colon cancer in his late 56s.    Endoscopic history: Screening colonoscopy 10/08/2017: 1 mm hyperplastic polyp in the transverse colon and left-sided mild diverticulosis. EGD 11/24/2018: 2 nonbleeding gastric ulcers in the antrum with associated deformity.  The largest lesion was 10 mm the smaller lesion was 5 mm.  There was also diffuse gastritis.  Biopsies confirmed H. pylori gastritis.    Past Medical History:  Diagnosis Date  . Anemia   . Anxiety   . Arthritis   . Atrophic endometrium 12/18/2016  . Fibromyalgia   . GERD (gastroesophageal reflux disease)   . Hypertension   . Jaundice    AGE 66 OR 21  . 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  Mitral valve problem   . Postmenopausal bleeding 12/31/2016  . Status post right foot surgery 04/12/2014  . Wears glasses   . Wears partial dentures    UPPER    Past Surgical History:  Procedure Laterality Date  . CLOSED MANIPULATION KNEE WITH STERIOD INJECTION  2011   rt  . COLONOSCOPY  AT  AGE 83-normal exam   [place]  . Cotton Osteotomy with Bone Graft Right 02/23/2014   Rt foot @ [facility]  . DIAGNOSTIC LAPAROSCOPY    . FOOT BONE EXCISION     both feet-spurs  . FOOT SURGERY     plantar fascitis  . HYSTEROSCOPY WITH D & C N/A 12/18/2016   Procedure: DILATATION AND CURETTAGE /HYSTEROSCOPY;  Surgeon: Janyth Contes, MD;  Location: [place];  Service: Gynecology;  Laterality: N/A;  . JOINT REPLACEMENT  2011   rt total knee replacement  . KNEE ARTHROPLASTY  2012   rt  . OPEN REDUCTION INTERNAL FIXATION (ORIF) METACARPAL Left 07/06/2012   Procedure: LEFT HAND EXPLORATION WOUNDS, OPEN REDUCTION INTERNAL FIXATION SMALL METACARPAL FRACTURE;  Surgeon: Tennis Must, MD;  Location: [place];  Service: Orthopedics;  Laterality: Left;  . REPAIR EXTENSOR TENDON Left 07/06/2012   Procedure: REPAIR EXTENSOR TENDON;  Surgeon: Tennis Must, MD;  Location: [place];  Service: Orthopedics;  Laterality: Left;  Left   . SHOULDER ARTHROSCOPY WITH DISTAL CLAVICLE RESECTION Left 01/24/2016   Procedure: SHOULDER ARTHROSCOPY WITH DISTAL CLAVICLE RESECTION;  Surgeon: [name], MD;  Location: [place];  Service: Orthopedics;  Laterality: Left;  . SHOULDER ARTHROSCOPY WITH ROTATOR CUFF REPAIR AND SUBACROMIAL DECOMPRESSION Left 01/24/2016   Procedure: LEFT SHOULDER ARTHROSCOPY ACROMIOPLASTY, CLAVICLECTOMY, ARTHROSCOPIC ROTATOR CUFF REPAIR;  Surgeon: [name], MD;  Location: [place];  Service: Orthopedics;  Laterality: Left;  . SHOULDER SURGERY  2010   rt    Current Outpatient Medications  Medication Sig Dispense Refill  . cyclobenzaprine (FLEXERIL) 5 MG tablet TAKE 1 TABLET BY MOUTH THREE TIMES A DAY AS NEEDED FOR MUSCLE SPASMS 60 tablet 0  . diclofenac Sodium (VOLTAREN) 1 % GEL APPLY 2 GRAMS TO AFFECTED AREA 4 TIMES A DAY 100 g 1  . amitriptyline (ELAVIL) 10 MG tablet TAKE 1 TABLET BY MOUTH EVERYDAY  AT BEDTIME 90 tablet 0  . amLODipine (NORVASC) 5 MG tablet Take 5 mg by mouth every morning.   3  . chlorhexidine (PERIDEX) 0.12 % solution See admin instructions.    . diphenhydrAMINE (BENADRYL) 25 mg capsule Take 1 capsule (25 mg total) by mouth every 6 (six) hours as needed. (Patient not taking: Reported on 11/24/2018) 30 capsule 0  . Elastic Bandages & Supports (THUMB SPLINT/LEFT MEDIUM) MISC Apply to left wrist as needed for wrist pain. 1 each 0  . metoprolol succinate (TOPROL-XL) 100 MG 24 hr tablet Take 100 mg by mouth every morning. Take with or immediately following a meal.    . Multiple Vitamins-Minerals (MULTIVITAMIN GUMMIES ADULT PO) Take by mouth.    . olmesartan (BENICAR) 40 MG tablet Take 40 mg by mouth daily.    Marland Kitchen omeprazole (PRILOSEC) 40 MG capsule Take 1 capsule (40 mg total) by mouth 2 (two) times daily. 60 capsule 1  . rosuvastatin (CRESTOR) 10 MG tablet Take 10 mg by mouth daily.  3   No current facility-administered medications for this visit.    Allergies as of 01/24/2019 - Review Complete 01/17/2019  Allergen Reaction Noted  .  Shrimp [shellfish allergy] Anaphylaxis, Shortness Of Breath, and Swelling 10/14/2011  . Codeine Itching 10/14/2011  . Shea butter  12/12/2016    Family History  Problem Relation Age of Onset  . Diabetes Mother   . Stroke Mother   . Stomach cancer Mother   . Esophageal cancer Mother   . Cancer Mother   . Cancer Father   . Hypertension Father   . Pancreatic cancer Brother   . Breast cancer Cousin   . Colon cancer Neg Hx   . Colon polyps Neg Hx     Social History   Socioeconomic History  . Marital status: Single    Spouse name: Not on file  . Number of children: Not on file  . Years of education: Not on file  . Highest education level: Not on file  Occupational History  . Not on file  Tobacco Use  . Smoking status: Never Smoker  . Smokeless tobacco: Never Used  Substance and Sexual Activity  . Alcohol use: No     Alcohol/week: 0.0 standard drinks  . Drug use: No  . Sexual activity: Not on file  Other Topics Concern  . Not on file  Social History Narrative  . Not on file   Social Determinants of Health   Financial Resource Strain:   . Difficulty of Paying Living Expenses: Not on file  Food Insecurity:   . Worried About Charity fundraiser in the Last Year: Not on file  . Ran Out of Food in the Last Year: Not on file  Transportation Needs:   . Lack of Transportation (Medical): Not on file  . Lack of Transportation (Non-Medical): Not on file  Physical Activity:   . Days of Exercise per Week: Not on file  . Minutes of Exercise per Session: Not on file  Stress:   . Feeling of Stress : Not on file  Social Connections:   . Frequency of Communication with Friends and Family: Not on file  . Frequency of Social Gatherings with Friends and Family: Not on file  . Attends Religious Services: Not on file  . Active Member of Clubs or Organizations: Not on file  . Attends Archivist Meetings: Not on file  . Marital Status: Not on file  Intimate Partner Violence:   . Fear of Current or Ex-Partner: Not on file  . Emotionally Abused: Not on file  . Physically Abused: Not on file  . Sexually Abused: Not on file     Physical Exam: General:   Alert,  well-nourished, pleasant and cooperative in NAD Head:  Normocephalic and atraumatic. Eyes:  Sclera clear, no icterus.   Conjunctiva pink. Abdomen:  Soft,nontender, nondistended, normal bowel sounds, no rebound or guarding. No hepatosplenomegaly.   Neurologic:  Alert and  oriented x4;  grossly nonfocal Skin:  Intact without significant lesions or rashes. Psych:  Alert and cooperative. Normal mood and affect.  Norine Reddington L. Tarri Glenn, MD, MPH 01/24/2019, 8:35 AM

## 2019-01-28 ENCOUNTER — Other Ambulatory Visit: Payer: Self-pay | Admitting: Gastroenterology

## 2019-02-01 DIAGNOSIS — M79645 Pain in left finger(s): Secondary | ICD-10-CM | POA: Diagnosis not present

## 2019-02-02 ENCOUNTER — Other Ambulatory Visit: Payer: Self-pay | Admitting: Internal Medicine

## 2019-02-02 ENCOUNTER — Other Ambulatory Visit: Payer: Self-pay | Admitting: Gastroenterology

## 2019-02-02 ENCOUNTER — Ambulatory Visit (INDEPENDENT_AMBULATORY_CARE_PROVIDER_SITE_OTHER): Payer: Medicaid Other

## 2019-02-02 ENCOUNTER — Telehealth: Payer: Self-pay | Admitting: Internal Medicine

## 2019-02-02 DIAGNOSIS — M25532 Pain in left wrist: Secondary | ICD-10-CM

## 2019-02-02 DIAGNOSIS — Z1159 Encounter for screening for other viral diseases: Secondary | ICD-10-CM

## 2019-02-02 LAB — SARS CORONAVIRUS 2 (TAT 6-24 HRS): SARS Coronavirus 2: NEGATIVE

## 2019-02-02 NOTE — Telephone Encounter (Signed)
Referral sent 

## 2019-02-02 NOTE — Telephone Encounter (Signed)
Pt would like a referral to dr Percell Miller at [facility] ortho. For her hand

## 2019-02-02 NOTE — Telephone Encounter (Signed)
Pt is want to be referral to an oped dr; pls call [phone]

## 2019-02-04 ENCOUNTER — Encounter: Payer: Self-pay | Admitting: Gastroenterology

## 2019-02-04 ENCOUNTER — Ambulatory Visit (AMBULATORY_SURGERY_CENTER): Payer: Medicaid Other | Admitting: Gastroenterology

## 2019-02-04 ENCOUNTER — Other Ambulatory Visit: Payer: Self-pay

## 2019-02-04 VITALS — BP 106/62 | HR 59 | Temp 98.3°F | Resp 15 | Ht 66.0 in | Wt 200.0 lb

## 2019-02-04 DIAGNOSIS — Z800 Family history of malignant neoplasm of digestive organs: Secondary | ICD-10-CM | POA: Diagnosis not present

## 2019-02-04 DIAGNOSIS — R1013 Epigastric pain: Secondary | ICD-10-CM | POA: Diagnosis not present

## 2019-02-04 DIAGNOSIS — K219 Gastro-esophageal reflux disease without esophagitis: Secondary | ICD-10-CM | POA: Diagnosis not present

## 2019-02-04 DIAGNOSIS — K259 Gastric ulcer, unspecified as acute or chronic, without hemorrhage or perforation: Secondary | ICD-10-CM

## 2019-02-04 DIAGNOSIS — K3189 Other diseases of stomach and duodenum: Secondary | ICD-10-CM

## 2019-02-04 DIAGNOSIS — M797 Fibromyalgia: Secondary | ICD-10-CM | POA: Diagnosis not present

## 2019-02-04 DIAGNOSIS — I10 Essential (primary) hypertension: Secondary | ICD-10-CM | POA: Diagnosis not present

## 2019-02-04 MED ORDER — SODIUM CHLORIDE 0.9 % IV SOLN: 500.0000 mL | Freq: Once | INTRAVENOUS | Status: DC

## 2019-02-04 NOTE — Progress Notes (Signed)
Temperature- [place]

## 2019-02-04 NOTE — Op Note (Addendum)
[facility] Patient Name: Sandra Church Procedure Date: 02/04/2019 8:08 AM MRN: [id number] Endoscopist: Thornton Park MD, MD Age: 59 Referring MD:  Date of Birth: May 13, 1960 Gender: Female Account #: [id number] Procedure:                Upper GI endoscopy Indications:              Follow-up of gastric ulcer with obstruction                           EGD 11/24/18 showed 2 gastric antral ulcers related                            to H pylori and NSAIDs                           She stopped using BC powder in November Medicines:                Monitored Anesthesia Care Procedure:                Pre-Anesthesia Assessment:                           - Prior to the procedure, a History and Physical                            was performed, and patient medications and                            allergies were reviewed. The patient's tolerance of                            previous anesthesia was also reviewed. The risks                            and benefits of the procedure and the sedation                            options and risks were discussed with the patient.                            All questions were answered, and informed consent                            was obtained. Prior Anticoagulants: The patient has                            taken no previous anticoagulant or antiplatelet                            agents. ASA Grade Assessment: II - A patient with                            mild systemic disease. After reviewing the risks  and benefits, the patient was deemed in                            satisfactory condition to undergo the procedure.                           After obtaining informed consent, the endoscope was                            passed under direct vision. Throughout the                            procedure, the patient's blood pressure, pulse, and                            oxygen saturations were monitored  continuously. The                            Endoscope was introduced through the mouth, and                            advanced to the third part of duodenum. The upper                            GI endoscopy was accomplished without difficulty.                            The patient tolerated the procedure well. Scope In: Scope Out: Findings:                 The esophagus was normal.                           Two non-bleeding cratered gastric ulcers with no                            stigmata of bleeding were found in the gastric                            antrum. The largest lesion was 3 mm in largest                            dimension. They have improved since the time of                            diagnosis in November. Biopsies were taken from the                            antrum, body, fundus, and ulcer margins with a cold                            forceps for histology. Estimated blood loss was  minimal.                           The examined duodenum was normal.                           The cardia and gastric fundus were normal on                            retroflexion.                           The exam was otherwise without abnormality. Complications:            No immediate complications. Estimated blood loss:                            Minimal. Estimated Blood Loss:     Estimated blood loss was minimal. Impression:               - Normal esophagus.                           - Non-bleeding gastric ulcers with no stigmata of                            bleeding. Improved since 11/24/18 but still                            present. Biopsied.                           - Normal examined duodenum.                           - The examination was otherwise normal. Recommendation:           - Patient has a contact number available for                            emergencies. The signs and symptoms of potential                            delayed  complications were discussed with the                            patient. Return to normal activities tomorrow.                            Written discharge instructions were provided to the                            patient.                           - Resume previous diet.                           - Continue  present medications. Continue omeprazole                            40 mg twice daily for 12 weeks.                           - No aspirin, ibuprofen, naproxen, or other                            non-steroidal anti-inflammatory drugs including BC                            powder.                           - Await pathology results.                           - Follow-up in the office in 8 weeks. Earlier if                            needed. Thornton Park MD, MD 02/04/2019 8:27:31 AM This report has been signed electronically.

## 2019-02-04 NOTE — Progress Notes (Signed)
Called to room to assist during endoscopic procedure.  Patient ID and intended procedure confirmed with present staff. Received instructions for my participation in the procedure from the performing physician.  

## 2019-02-04 NOTE — Patient Instructions (Signed)
Handout on peptic ulcer given to you today.  Continue taking omeprazole 40mg  twice a day for 12 weeks  NO ASPIRIN, ASPIRIN CONTAINING PRODUCTS (BC OR GOODY POWDERS) OR NSAIDS (IBUPROFEN, ADVIL, ALEVE, AND MOTRIN); TYLENOL IS OK TO TAKE   YOU HAD AN ENDOSCOPIC PROCEDURE TODAY AT THE [facility]:   Refer to the procedure report that was given to you for any specific questions about what was found during the examination.  If the procedure report does not answer your questions, please call your gastroenterologist to clarify.  If you requested that your care partner not be given the details of your procedure findings, then the procedure report has been included in a sealed envelope for you to review at your convenience later.  YOU SHOULD EXPECT: Some feelings of bloating in the abdomen. Passage of more gas than usual.  Walking can help get rid of the air that was put into your GI tract during the procedure and reduce the bloating. If you had a lower endoscopy (such as a colonoscopy or flexible sigmoidoscopy) you may notice spotting of blood in your stool or on the toilet paper. If you underwent a bowel prep for your procedure, you may not have a normal bowel movement for a few days.  Please Note:  You might notice some irritation and congestion in your nose or some drainage.  This is from the oxygen used during your procedure.  There is no need for concern and it should clear up in a day or so.  SYMPTOMS TO REPORT IMMEDIATELY:   Following upper endoscopy (EGD)  Vomiting of blood or coffee ground material  New chest pain or pain under the shoulder blades  Painful or persistently difficult swallowing  New shortness of breath  Fever of 100F or higher  Black, tarry-looking stools  For urgent or emergent issues, a gastroenterologist can be reached at any hour by calling [phone].   DIET:  We do recommend a small meal at first, but then you may proceed to your regular diet.  Drink  plenty of fluids but you should avoid alcoholic beverages for 24 hours.  ACTIVITY:  You should plan to take it easy for the rest of today and you should NOT DRIVE or use heavy machinery until tomorrow (because of the sedation medicines used during the test).    FOLLOW UP: Our staff will call the number listed on your records 48-72 hours following your procedure to check on you and address any questions or concerns that you may have regarding the information given to you following your procedure. If we do not reach you, we will leave a message.  We will attempt to reach you two times.  During this call, we will ask if you have developed any symptoms of COVID 19. If you develop any symptoms (ie: fever, flu-like symptoms, shortness of breath, cough etc.) before then, please call [phone].  If you test positive for Covid 19 in the 2 weeks post procedure, please call and report this information to [place].    If any biopsies were taken you will be contacted by phone or by letter within the next 1-3 weeks.  Please call us at [phone] if you have not heard about the biopsies in 3 weeks.    SIGNATURES/CONFIDENTIALITY: You and/or your care partner have signed paperwork which will be entered into your electronic medical record.  These signatures attest to the fact that that the information above on your After Visit Summary has been  reviewed and is understood.  Full responsibility of the confidentiality of this discharge information lies with you and/or your care-partner. 

## 2019-02-04 NOTE — Progress Notes (Signed)
Report to PACU, RN, vss, BBS= Clear.  

## 2019-02-08 ENCOUNTER — Telehealth: Payer: Self-pay

## 2019-02-08 ENCOUNTER — Encounter: Payer: Self-pay | Admitting: *Deleted

## 2019-02-08 ENCOUNTER — Telehealth: Payer: Self-pay | Admitting: *Deleted

## 2019-02-08 NOTE — Telephone Encounter (Signed)
  Follow up Call-  Call back number 02/04/2019 11/24/2018 10/08/2017  Post procedure Call Back phone  # [phone]  Permission to leave phone message Yes Yes Yes  Some recent data might be hidden     Patient questions:  Message left to call us if necessary.

## 2019-02-08 NOTE — Telephone Encounter (Signed)
  Follow up Call-  Call back number 02/04/2019 11/24/2018 10/08/2017  Post procedure Call Back phone  # [phone]  Permission to leave phone message Yes Yes Yes  Some recent data might be hidden     Patient questions:  Do you have a fever, pain , or abdominal swelling? No. Pain Score  0 *  Have you tolerated food without any problems? Yes.    Have you been able to return to your normal activities? Yes.    Do you have any questions about your discharge instructions: Diet   No. Medications  No. Follow up visit  No.  Do you have questions or concerns about your Care? No.  Actions: * If pain score is 4 or above: No action needed, pain <4. 1. Have you developed a fever since your procedure? no  2.   Have you had an respiratory symptoms (SOB or cough) since your procedure? no  3.   Have you tested positive for COVID 19 since your procedure no  4.   Have you had any family members/close contacts diagnosed with the COVID 19 since your procedure?  no   If yes to any of these questions please route to Joylene John, RN and Alphonsa Gin, Therapist, sports.

## 2019-02-09 ENCOUNTER — Encounter: Payer: Self-pay | Admitting: Gastroenterology

## 2019-03-17 DIAGNOSIS — M5416 Radiculopathy, lumbar region: Secondary | ICD-10-CM | POA: Diagnosis not present

## 2019-03-21 DIAGNOSIS — K219 Gastro-esophageal reflux disease without esophagitis: Secondary | ICD-10-CM | POA: Diagnosis not present

## 2019-03-21 DIAGNOSIS — I208 Other forms of angina pectoris: Secondary | ICD-10-CM | POA: Diagnosis not present

## 2019-03-21 DIAGNOSIS — E785 Hyperlipidemia, unspecified: Secondary | ICD-10-CM | POA: Diagnosis not present

## 2019-03-21 DIAGNOSIS — M1990 Unspecified osteoarthritis, unspecified site: Secondary | ICD-10-CM | POA: Diagnosis not present

## 2019-03-21 DIAGNOSIS — I10 Essential (primary) hypertension: Secondary | ICD-10-CM | POA: Diagnosis not present

## 2019-03-22 NOTE — Progress Notes (Signed)
Referring Provider: Marty Heck, DO Primary Care Physician:  Marty Heck, DO  Chief complaint:  Epigastric pain  IMPRESSION:  H. pylori and NSAID associated gastric ulcers and gastritis on EGD 11/24/2018     - presenting with epigastric and chest pain  History of frequent use of BC powders, none since gastric ulcer diagnosis Family history of gastric cancer (mother) Family history of pancreatic cancer (brother) Sigmoid diverticulosis Hyperplastic polyp on screening colonoscopy 09/2017    - screening recommended 2029  EGD showed improvement, although not complete resolution, of gastric ulcers.  Recommended adherence with PPI BID dosing schedule for 8 weeks. Continue to avoid all NSAIDs.   Reviewed colonoscopy reports from 09/2017. No first degree family members with colon cancer. Next exam recommended in 10 years.    PLAN: - Continue omeprazole 40 mg BID with encouragement to use a timer to remember her second dose of the daily - Reviewed lifestyle modifications - Continue to avoid all NSAIDs - Low threshold to consider cross-sectional abdominal imaging if symptoms persist and follow-up EGD is negative - Colonoscopy 09/2027 - Covid vaccine encouraged - Follow-up in 2-3 months, earlier if needed   Please see the "Patient Instructions" section for addition details about the plan.  HPI: Sandra Church is a 59 y.o. female who self-referred for epigastric pain.  She was seen in consult 11/08/2018 and had an upper endoscopy 11/24/2018.  Follow-up EGD was performed 02/04/19. She returns in scheduled follow-up.  The interval history is obtained through the patient and review of her electronic health record.  She has fibromyalgia.   Seen in consultation in October for epigastric and chest pain with associated irritation. Controlled on PPI when she started taking it regularly. Fluctuating diarrhea and constipation depending on what she eats.  She has been using BC  powders.  EGD 11/24/2018 showed 2 nonbleeding gastric ulcers in the antrum with associated deformity.  The largest lesion was 10 mm the smaller lesion was 5 mm.  There was also diffuse gastritis.  Biopsies confirmed H. pylori gastritis.  H. pylori was treated with 10 days of amoxicillin 1 g twice daily, clarithromycin 500 mg twice daily, and omeprazole 40 mg twice daily.  I recommended that she avoid all NSAIDs including BC powder.  Repeat upper endoscopy recommended in 8 to 10 weeks to check for healing.  Her abdominal pain has largely resolved, although some chest pain/tightness lingers, most recently last week. She stopped using all BC powder. Now using generic Tylenol and heating pads.  No dysphagia or odynophagia. No change in bowel habits or blood in the stool.   Upper endoscopy 02/04/2019: 2 nonbleeding cratered gastric ulcers the largest measuring 3 mm located in the gastric antrum.  They had improved since the time of diagnosis in November.  The exam was otherwise normal.  Gastric biopsies were negative for H. pylori.  She is overall feeling better. Takes her PPI most mornings. Misses some afternoon doses.  Having some indigestion when she eats spicy foods.  No longer using BC. She now uses Tylenol as needed.  No other NSAIDs.  No further abdominal pain.   Endoscopic history: Screening colonoscopy 10/08/2017: 1 mm hyperplastic polyp in the transverse colon and left-sided mild diverticulosis. EGD 11/24/2018: 2 nonbleeding gastric ulcers in the antrum with associated deformity.  The largest lesion was 10 mm the smaller lesion was 5 mm.  There was also diffuse gastritis.  Biopsies confirmed H. pylori gastritis.    Past Medical History:  Diagnosis  Date  . Anemia   . Anxiety   . Arthritis   . Atrophic endometrium 12/18/2016  . Chronic kidney disease    kidney stones  . Fibromyalgia   . GERD (gastroesophageal reflux disease)   . Heart murmur   . Hyperlipidemia   . Hypertension   .  Jaundice    AGE 27 OR 21  . 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 Mitral valve problem   . Postmenopausal bleeding 12/31/2016  . Status post right foot surgery 04/12/2014  . Wears glasses   . Wears partial dentures    UPPER    Past Surgical History:  Procedure Laterality Date  . CLOSED MANIPULATION KNEE WITH STERIOD INJECTION  2011   rt  . COLONOSCOPY  AT AGE 84-normal exam   [facility]  . Cotton Osteotomy with Bone Graft Right 02/23/2014   Rt foot @ [facility]  . DIAGNOSTIC LAPAROSCOPY    . FOOT BONE EXCISION     both feet-spurs  . FOOT SURGERY     plantar fascitis  . HYSTEROSCOPY WITH D & C N/A 12/18/2016   Procedure: DILATATION AND CURETTAGE /HYSTEROSCOPY;  Surgeon: Janyth Contes, MD;  Location: [facility];  Service: Gynecology;  Laterality: N/A;  . JOINT REPLACEMENT  2011   rt total knee replacement  . KNEE ARTHROPLASTY  2012   rt  . OPEN REDUCTION INTERNAL FIXATION (ORIF) METACARPAL Left 07/06/2012   Procedure: LEFT HAND EXPLORATION WOUNDS, OPEN REDUCTION INTERNAL FIXATION SMALL METACARPAL FRACTURE;  Surgeon: Tennis Must, MD;  Location: [facility];  Service: Orthopedics;  Laterality: Left;  . REPAIR EXTENSOR TENDON Left 07/06/2012   Procedure: REPAIR EXTENSOR TENDON;  Surgeon: Tennis Must, MD;  Location: [facility];  Service: Orthopedics;  Laterality: Left;  Left   . SHOULDER ARTHROSCOPY WITH DISTAL CLAVICLE RESECTION Left 01/24/2016   Procedure: SHOULDER ARTHROSCOPY WITH DISTAL CLAVICLE RESECTION;  Surgeon: [name], MD;  Location: [facility];  Service: Orthopedics;  Laterality: Left;  . SHOULDER ARTHROSCOPY WITH ROTATOR CUFF REPAIR AND SUBACROMIAL DECOMPRESSION Left 01/24/2016   Procedure: LEFT SHOULDER ARTHROSCOPY ACROMIOPLASTY, CLAVICLECTOMY, ARTHROSCOPIC ROTATOR CUFF REPAIR;  Surgeon: [name], MD;  Location: [facility];  Service: Orthopedics;  Laterality: Left;  . SHOULDER SURGERY  2010    rt    Current Outpatient Medications  Medication Sig Dispense Refill  . acetaminophen (TYLENOL) 500 MG tablet Take 500 mg by mouth every 6 (six) hours as needed.    Marland Kitchen amitriptyline (ELAVIL) 10 MG tablet TAKE 1 TABLET BY MOUTH EVERYDAY AT BEDTIME 90 tablet 0  . amLODipine (NORVASC) 5 MG tablet Take 5 mg by mouth every morning.   3  . chlorhexidine (PERIDEX) 0.12 % solution See admin instructions.    . cyclobenzaprine (FLEXERIL) 5 MG tablet TAKE 1 TABLET BY MOUTH THREE TIMES A DAY AS NEEDED FOR MUSCLE SPASMS 60 tablet 0  . diclofenac Sodium (VOLTAREN) 1 % GEL APPLY 2 GRAMS TO AFFECTED AREA 4 TIMES A DAY 100 g 1  . diphenhydrAMINE (BENADRYL) 25 mg capsule Take 1 capsule (25 mg total) by mouth every 6 (six) hours as needed. 30 capsule 0  . Elastic Bandages & Supports (THUMB SPLINT/LEFT MEDIUM) MISC Apply to left wrist as needed for wrist pain. 1 each 0  . metoprolol succinate (TOPROL-XL) 100 MG 24 hr tablet Take 100 mg by mouth every morning. Take with or immediately following a meal.    . Multiple Vitamins-Minerals (MULTIVITAMIN GUMMIES ADULT PO)  Take by mouth.    . olmesartan (BENICAR) 40 MG tablet Take 40 mg by mouth daily.    Marland Kitchen omeprazole (PRILOSEC) 40 MG capsule TAKE 1 CAPSULE BY MOUTH TWICE A DAY 60 capsule 1   No current facility-administered medications for this visit.    Allergies as of 03/23/2019 - Review Complete 02/04/2019  Allergen Reaction Noted  . Shrimp [shellfish allergy] Anaphylaxis, Shortness Of Breath, and Swelling 10/14/2011  . Codeine Itching 10/14/2011  . Shea butter  12/12/2016    Family History  Problem Relation Age of Onset  . Diabetes Mother   . Stroke Mother   . Stomach cancer Mother   . Esophageal cancer Mother   . Cancer Mother   . Cancer Father   . Hypertension Father   . Pancreatic cancer Brother   . Breast cancer Cousin   . Colon cancer Maternal Uncle   . Colon polyps Neg Hx   . Rectal cancer Neg Hx     Social History   Socioeconomic  History  . Marital status: Single    Spouse name: Not on file  . Number of children: Not on file  . Years of education: Not on file  . Highest education level: Not on file  Occupational History  . Not on file  Tobacco Use  . Smoking status: Never Smoker  . Smokeless tobacco: Never Used  Substance and Sexual Activity  . Alcohol use: No    Alcohol/week: 0.0 standard drinks  . Drug use: No  . Sexual activity: Not on file  Other Topics Concern  . Not on file  Social History Narrative  . Not on file   Social Determinants of Health   Financial Resource Strain:   . Difficulty of Paying Living Expenses: Not on file  Food Insecurity:   . Worried About Charity fundraiser in the Last Year: Not on file  . Ran Out of Food in the Last Year: Not on file  Transportation Needs:   . Lack of Transportation (Medical): Not on file  . Lack of Transportation (Non-Medical): Not on file  Physical Activity:   . Days of Exercise per Week: Not on file  . Minutes of Exercise per Session: Not on file  Stress:   . Feeling of Stress : Not on file  Social Connections:   . Frequency of Communication with Friends and Family: Not on file  . Frequency of Social Gatherings with Friends and Family: Not on file  . Attends Religious Services: Not on file  . Active Member of Clubs or Organizations: Not on file  . Attends Archivist Meetings: Not on file  . Marital Status: Not on file  Intimate Partner Violence:   . Fear of Current or Ex-Partner: Not on file  . Emotionally Abused: Not on file  . Physically Abused: Not on file  . Sexually Abused: Not on file     Physical Exam: General:   Alert,  well-nourished, pleasant and cooperative in NAD Head:  Normocephalic and atraumatic. Eyes:  Sclera clear, no icterus.   Conjunctiva pink. Abdomen:  Soft, nontender, nondistended, normal bowel sounds, no rebound or guarding. No hepatosplenomegaly.   Neurologic:  Alert and  oriented x4;  grossly  nonfocal Skin: No rash or bruise.  Psych:  Alert and cooperative. Normal mood and affect.  Courteny Egler L. Tarri Glenn, MD, MPH 03/22/2019, 12:07 PM

## 2019-03-23 ENCOUNTER — Encounter: Payer: Self-pay | Admitting: Gastroenterology

## 2019-03-23 ENCOUNTER — Ambulatory Visit (INDEPENDENT_AMBULATORY_CARE_PROVIDER_SITE_OTHER): Payer: Medicaid Other | Admitting: Gastroenterology

## 2019-03-23 VITALS — BP 118/64 | HR 72 | Temp 98.6°F | Ht 66.0 in | Wt 202.4 lb

## 2019-03-23 DIAGNOSIS — K259 Gastric ulcer, unspecified as acute or chronic, without hemorrhage or perforation: Secondary | ICD-10-CM

## 2019-03-23 DIAGNOSIS — R1013 Epigastric pain: Secondary | ICD-10-CM | POA: Diagnosis not present

## 2019-03-23 NOTE — Patient Instructions (Signed)
-   Continue omeprazole 40 mg twice a day with encouragement to use a timer to remember her second dose of the daily - Patient advised to avoid spicy, acidic, citrus, chocolate, mints, fruit and fruit juices.  Limit the intake of caffeine, alcohol and Soda.  Don't exercise too soon after eating.  Don't lie down within 3-4 hours of eating.  Elevate the head of your bed. - Continue to avoid all NSAIDs - Low threshold to consider cross-sectional abdominal imaging if symptoms persist and follow-up EGD is negative - Colonoscopy 09/2027 - Covid vaccine encouraged - Follow-up in 2-3 months, earlier if needed

## 2019-04-05 NOTE — H&P (Signed)
[facility] [address]   [place] [address] [phone] A Division of [facility]  RE: Dalli, Furse                                  [id number]         DOB: January 31, 1960 04/01/2019  Reason for visit:  Left wrist de Quervain's.    HPI: She had an injection in January.  This helped significantly, but now it is very painful.  She just wants it to go away.    OBJECTIVE: The patient is a well appearing female, in no apparent distress.  Positive Finkelstein's.  Neurovascularly intact.  Tenderness over her first dorsal compartment.    ASSESSMENT/PLAN:  Sandra Church to the left wrist.  I discussed her options. We could repeat an injection versus surgical release.  She would like to go forward with surgical release of this.  I discussed the risks and benefits, and again she would like to go forward with that.     Ernesta Amble.  Percell Miller, M.D.  Electronically verified by Ernesta Amble. Percell Miller, M.D. TDM:pmw D 04/04/19 T 04/05/19

## 2019-04-07 ENCOUNTER — Ambulatory Visit: Payer: Medicaid Other | Admitting: Gastroenterology

## 2019-04-12 ENCOUNTER — Encounter (HOSPITAL_BASED_OUTPATIENT_CLINIC_OR_DEPARTMENT_OTHER): Payer: Self-pay | Admitting: Orthopedic Surgery

## 2019-04-12 ENCOUNTER — Other Ambulatory Visit: Payer: Self-pay

## 2019-04-12 NOTE — Progress Notes (Addendum)
Spoke with Janett Billow zanetto pa ok to proceed  Spoke w/ via phone for pre-op interview---patient Lab needs dos----I stat 8, ekg              Lab results------lov dr Terrence Dupont 03-21-2019 on chart Stress test 03-14-2015 epic COVID test ------04-15-2019 at [facility] at -------610 am 04-19-2019 NPO after ------midnight Medications to take morning of surgery -----metoprolol succinate, amlodipine, omeprazole Diabetic medication -----n/a Patient Special Instructions ----- Pre-Op special Istructions ----- Patient verbalized understanding of instructions that were given at this phone interview. Patient denies shortness of breath, chest pain, fever, cough a this phone interview.  Anesthesia stable angina, mvp Chart sent to jessica zanetto pa for review  [id number] [facility] Cardiologist : dr Philis Kendall 03-21-2019 on chart Chest x-ray :none EKG :none Echo :none Stress test 03-14-2015 epic Cardiac Cath : none Sleep Study/ CPAP :n/a Fasting Blood Sugar :      / Checks Blood Sugar -- times a day:  n/a Blood Thinner/ Instructions /Last Dose:n/a ASA / Instructions/ Last Dose : n/a  Patient denies shortness of breath, chest pain, fever, and cough at this phone interview.

## 2019-04-14 DIAGNOSIS — M48061 Spinal stenosis, lumbar region without neurogenic claudication: Secondary | ICD-10-CM | POA: Diagnosis not present

## 2019-04-14 DIAGNOSIS — M5416 Radiculopathy, lumbar region: Secondary | ICD-10-CM | POA: Diagnosis not present

## 2019-04-15 ENCOUNTER — Other Ambulatory Visit (HOSPITAL_COMMUNITY)
Admission: RE | Admit: 2019-04-15 | Discharge: 2019-04-15 | Disposition: A | Discharge status: Home / Self Care | Payer: Medicaid Other | Source: Ambulatory Visit | Attending: Orthopedic Surgery | Admitting: Orthopedic Surgery

## 2019-04-15 DIAGNOSIS — Z20822 Contact with and (suspected) exposure to covid-19: Secondary | ICD-10-CM | POA: Diagnosis not present

## 2019-04-15 DIAGNOSIS — Z01812 Encounter for preprocedural laboratory examination: Secondary | ICD-10-CM | POA: Diagnosis not present

## 2019-04-15 LAB — SARS CORONAVIRUS 2 (TAT 6-24 HRS): SARS Coronavirus 2: NEGATIVE

## 2019-04-18 NOTE — Anesthesia Preprocedure Evaluation (Addendum)
Anesthesia Evaluation  Patient identified by MRN, date of birth, ID band Patient awake    Reviewed: Allergy & Precautions, NPO status , Patient's Chart, lab work & pertinent test results  History of Anesthesia Complications Negative for: history of anesthetic complications  Airway Mallampati: III  TM Distance: >3 FB Neck ROM: Full    Dental  (+) Dental Advisory Given, Partial Upper,    Pulmonary neg pulmonary ROS,    Pulmonary exam normal        Cardiovascular hypertension, Pt. on medications and Pt. on home beta blockers Normal cardiovascular exam     Neuro/Psych Anxiety Depression negative neurological ROS     GI/Hepatic Neg liver ROS, GERD  Controlled and Medicated,  Endo/Other  negative endocrine ROS  Renal/GU negative Renal ROS  negative genitourinary   Musculoskeletal  (+) Arthritis , Fibromyalgia -  Abdominal Normal abdominal exam  (+)   Peds  Hematology negative hematology ROS (+)   Anesthesia Other Findings Day of surgery medications reviewed with patient.  Reproductive/Obstetrics negative OB ROS                         Anesthesia Physical Anesthesia Plan  ASA: II  Anesthesia Plan: MAC   Post-op Pain Management:    Induction:   PONV Risk Score and Plan: 2 and Treatment may vary due to age or medical condition, Ondansetron, Propofol infusion and Midazolam  Airway Management Planned: Natural Airway and Nasal Cannula  Additional Equipment: None  Intra-op Plan:   Post-operative Plan:   Informed Consent: I have reviewed the patients History and Physical, chart, labs and discussed the procedure including the risks, benefits and alternatives for the proposed anesthesia with the patient or authorized representative who has indicated his/her understanding and acceptance.       Plan Discussed with: CRNA  Anesthesia Plan Comments:        Anesthesia Quick  Evaluation

## 2019-04-19 ENCOUNTER — Ambulatory Visit (HOSPITAL_BASED_OUTPATIENT_CLINIC_OR_DEPARTMENT_OTHER)
Admission: RE | Admit: 2019-04-19 | Discharge: 2019-04-19 | Disposition: A | Discharge status: Home / Self Care | Payer: Medicaid Other | Attending: Orthopedic Surgery | Admitting: Orthopedic Surgery

## 2019-04-19 ENCOUNTER — Encounter (HOSPITAL_BASED_OUTPATIENT_CLINIC_OR_DEPARTMENT_OTHER): Payer: Self-pay | Admitting: Orthopedic Surgery

## 2019-04-19 ENCOUNTER — Ambulatory Visit (HOSPITAL_BASED_OUTPATIENT_CLINIC_OR_DEPARTMENT_OTHER): Payer: Medicaid Other | Admitting: Physician Assistant

## 2019-04-19 ENCOUNTER — Other Ambulatory Visit: Payer: Self-pay

## 2019-04-19 ENCOUNTER — Encounter (HOSPITAL_BASED_OUTPATIENT_CLINIC_OR_DEPARTMENT_OTHER)
Admission: RE | Disposition: A | Discharge status: Home / Self Care | Payer: Self-pay | Source: Home / Self Care | Attending: Orthopedic Surgery

## 2019-04-19 DIAGNOSIS — F418 Other specified anxiety disorders: Secondary | ICD-10-CM | POA: Diagnosis not present

## 2019-04-19 DIAGNOSIS — M797 Fibromyalgia: Secondary | ICD-10-CM | POA: Diagnosis not present

## 2019-04-19 DIAGNOSIS — I10 Essential (primary) hypertension: Secondary | ICD-10-CM | POA: Diagnosis not present

## 2019-04-19 DIAGNOSIS — M654 Radial styloid tenosynovitis [de Quervain]: Secondary | ICD-10-CM | POA: Diagnosis not present

## 2019-04-19 HISTORY — PX: MINOR RELEASE DORSAL COMPARTMENT (DEQUERVAINS): SHX5980

## 2019-04-19 HISTORY — DX: Personal history of urinary calculi: Z87.442

## 2019-04-19 HISTORY — DX: Other forms of angina pectoris: I20.89

## 2019-04-19 HISTORY — DX: Nonrheumatic mitral (valve) prolapse: I34.1

## 2019-04-19 HISTORY — DX: Other forms of angina pectoris: I20.8

## 2019-04-19 LAB — POCT I-STAT, CHEM 8
BUN: 17 mg/dL (ref 6–20)
Calcium, Ion: 1.31 mmol/L (ref 1.15–1.40)
Chloride: 106 mmol/L (ref 98–111)
Creatinine, Ser: 0.6 mg/dL (ref 0.44–1.00)
Glucose, Bld: 99 mg/dL (ref 70–99)
HCT: 40 % (ref 36.0–46.0)
Hemoglobin: 13.6 g/dL (ref 12.0–15.0)
Potassium: 3.5 mmol/L (ref 3.5–5.1)
Sodium: 143 mmol/L (ref 135–145)
TCO2: 28 mmol/L (ref 22–32)

## 2019-04-19 SURGERY — MINOR RELEASE DORSAL COMPARTMENT (DEQUERVAINS)
Anesthesia: Monitor Anesthesia Care | Site: Wrist | Laterality: Left

## 2019-04-19 MED ORDER — LACTATED RINGERS IV SOLN
INTRAVENOUS | Status: DC
  Filled 2019-04-19: qty 1000

## 2019-04-19 MED ORDER — ONDANSETRON HCL 4 MG PO TABS: 4.0000 mg | ORAL_TABLET | Freq: Three times a day (TID) | ORAL | 0 refills | Status: DC | PRN

## 2019-04-19 MED ORDER — OXYCODONE HCL 5 MG/5ML PO SOLN
5.0000 mg | Freq: Once | ORAL | Status: DC | PRN
  Filled 2019-04-19: qty 5

## 2019-04-19 MED ORDER — FENTANYL CITRATE (PF) 100 MCG/2ML IJ SOLN
INTRAMUSCULAR | Status: DC | PRN
  Administered 2019-04-19: 50 ug via INTRAVENOUS

## 2019-04-19 MED ORDER — LIDOCAINE 2% (20 MG/ML) 5 ML SYRINGE
INTRAMUSCULAR | Status: AC
  Filled 2019-04-19: qty 5

## 2019-04-19 MED ORDER — OXYCODONE HCL 5 MG PO TABS
5.0000 mg | ORAL_TABLET | Freq: Once | ORAL | Status: DC | PRN
  Filled 2019-04-19: qty 1

## 2019-04-19 MED ORDER — ACETAMINOPHEN 500 MG PO TABS
ORAL_TABLET | ORAL | Status: AC
  Filled 2019-04-19: qty 2

## 2019-04-19 MED ORDER — HYDROCODONE-ACETAMINOPHEN 5-325 MG PO TABS: 1.0000 | ORAL_TABLET | Freq: Four times a day (QID) | ORAL | 0 refills | Status: AC | PRN

## 2019-04-19 MED ORDER — PROPOFOL 500 MG/50ML IV EMUL
INTRAVENOUS | Status: DC | PRN
  Administered 2019-04-19: 200 ug/kg/min via INTRAVENOUS

## 2019-04-19 MED ORDER — MIDAZOLAM HCL 2 MG/2ML IJ SOLN
INTRAMUSCULAR | Status: AC
  Filled 2019-04-19: qty 2

## 2019-04-19 MED ORDER — ACETAMINOPHEN 500 MG PO TABS
1000.0000 mg | ORAL_TABLET | Freq: Once | ORAL | Status: DC
  Filled 2019-04-19: qty 2

## 2019-04-19 MED ORDER — PROMETHAZINE HCL 25 MG/ML IJ SOLN
6.2500 mg | INTRAMUSCULAR | Status: DC | PRN
  Filled 2019-04-19: qty 1

## 2019-04-19 MED ORDER — ONDANSETRON HCL 4 MG/2ML IJ SOLN
INTRAMUSCULAR | Status: AC
  Filled 2019-04-19: qty 2

## 2019-04-19 MED ORDER — DEXAMETHASONE SODIUM PHOSPHATE 10 MG/ML IJ SOLN
INTRAMUSCULAR | Status: DC | PRN
  Administered 2019-04-19: 5 mg via INTRAVENOUS

## 2019-04-19 MED ORDER — LIDOCAINE 2% (20 MG/ML) 5 ML SYRINGE
INTRAMUSCULAR | Status: DC | PRN
  Administered 2019-04-19: 20 mg via INTRAVENOUS

## 2019-04-19 MED ORDER — PROPOFOL 10 MG/ML IV BOLUS
INTRAVENOUS | Status: AC
  Filled 2019-04-19: qty 40

## 2019-04-19 MED ORDER — LIDOCAINE HCL 1 % IJ SOLN
INTRAMUSCULAR | Status: DC | PRN
  Administered 2019-04-19: 2 mL

## 2019-04-19 MED ORDER — FENTANYL CITRATE (PF) 100 MCG/2ML IJ SOLN
25.0000 ug | INTRAMUSCULAR | Status: DC | PRN
  Filled 2019-04-19: qty 1

## 2019-04-19 MED ORDER — LACTATED RINGERS IV SOLN
INTRAVENOUS | Status: DC
  Filled 2019-04-19 (×2): qty 1000

## 2019-04-19 MED ORDER — CHLORHEXIDINE GLUCONATE 4 % EX LIQD
60.0000 mL | Freq: Once | CUTANEOUS | Status: DC
  Filled 2019-04-19: qty 118

## 2019-04-19 MED ORDER — CEFAZOLIN SODIUM-DEXTROSE 2-4 GM/100ML-% IV SOLN
2.0000 g | INTRAVENOUS | Status: AC
  Administered 2019-04-19: 2 g via INTRAVENOUS
  Filled 2019-04-19: qty 100

## 2019-04-19 MED ORDER — MIDAZOLAM HCL 2 MG/2ML IJ SOLN
INTRAMUSCULAR | Status: DC | PRN
  Administered 2019-04-19: 2 mg via INTRAVENOUS

## 2019-04-19 MED ORDER — BUPIVACAINE HCL (PF) 0.25 % IJ SOLN
INTRAMUSCULAR | Status: DC | PRN
  Administered 2019-04-19: 2 mL

## 2019-04-19 MED ORDER — CEFAZOLIN SODIUM-DEXTROSE 2-4 GM/100ML-% IV SOLN
INTRAVENOUS | Status: AC
  Filled 2019-04-19: qty 100

## 2019-04-19 MED ORDER — ACETAMINOPHEN 500 MG PO TABS
1000.0000 mg | ORAL_TABLET | Freq: Once | ORAL | Status: AC
  Administered 2019-04-19: 07:00:00 1000 mg via ORAL
  Filled 2019-04-19: qty 2

## 2019-04-19 MED ORDER — DEXAMETHASONE SODIUM PHOSPHATE 10 MG/ML IJ SOLN
INTRAMUSCULAR | Status: AC
  Filled 2019-04-19: qty 1

## 2019-04-19 MED ORDER — FENTANYL CITRATE (PF) 100 MCG/2ML IJ SOLN
INTRAMUSCULAR | Status: AC
  Filled 2019-04-19: qty 2

## 2019-04-19 MED ORDER — ONDANSETRON HCL 4 MG/2ML IJ SOLN
INTRAMUSCULAR | Status: DC | PRN
  Administered 2019-04-19: 4 mg via INTRAVENOUS

## 2019-04-19 SURGICAL SUPPLY — 48 items
BANDAGE ACE 3X5.8 VEL STRL LF (GAUZE/BANDAGES/DRESSINGS) ×3 IMPLANT
BLADE SURG 11 STRL SS (BLADE) IMPLANT
BLADE SURG 15 STRL LF DISP TIS (BLADE) ×1 IMPLANT
BLADE SURG 15 STRL SS (BLADE) ×3
BNDG CMPR 9X4 STRL LF SNTH (GAUZE/BANDAGES/DRESSINGS) ×1
BNDG COHESIVE 3X5 TAN STRL LF (GAUZE/BANDAGES/DRESSINGS) IMPLANT
BNDG ELASTIC 3X5.8 VLCR STR LF (GAUZE/BANDAGES/DRESSINGS) ×2 IMPLANT
BNDG ESMARK 4X9 LF (GAUZE/BANDAGES/DRESSINGS) ×2 IMPLANT
BNDG GAUZE ELAST 4 BULKY (GAUZE/BANDAGES/DRESSINGS) ×3 IMPLANT
CHLORAPREP W/TINT 26 (MISCELLANEOUS) ×3 IMPLANT
CLOSURE STERI-STRIP 1/2X4 (GAUZE/BANDAGES/DRESSINGS) ×1
CLOSURE WOUND 1/2 X4 (GAUZE/BANDAGES/DRESSINGS) ×1
CLSR STERI-STRIP ANTIMIC 1/2X4 (GAUZE/BANDAGES/DRESSINGS) ×2 IMPLANT
CORD BIPOLAR FORCEPS 12FT (ELECTRODE) ×2 IMPLANT
COVER BACK TABLE 60X90IN (DRAPES) ×3 IMPLANT
COVER WAND RF STERILE (DRAPES) ×3 IMPLANT
CUFF TOURN SGL QUICK 18X4 (TOURNIQUET CUFF) ×3 IMPLANT
DRAPE EXTREMITY T 121X128X90 (DISPOSABLE) ×3 IMPLANT
DRAPE IMP U-DRAPE 54X76 (DRAPES) ×3 IMPLANT
DRAPE SURG 17X23 STRL (DRAPES) ×3 IMPLANT
DRSG EMULSION OIL 3X3 NADH (GAUZE/BANDAGES/DRESSINGS) ×3 IMPLANT
DRSG PAD ABDOMINAL 8X10 ST (GAUZE/BANDAGES/DRESSINGS) IMPLANT
GAUZE SPONGE 4X4 12PLY STRL (GAUZE/BANDAGES/DRESSINGS) ×3 IMPLANT
GAUZE SPONGE 4X4 12PLY STRL LF (GAUZE/BANDAGES/DRESSINGS) ×2 IMPLANT
GLOVE BIO SURGEON STRL SZ7.5 (GLOVE) ×6 IMPLANT
GLOVE BIOGEL PI IND STRL 8 (GLOVE) ×2 IMPLANT
GLOVE BIOGEL PI INDICATOR 8 (GLOVE) ×4
GOWN STRL REUS W/ TWL LRG LVL3 (GOWN DISPOSABLE) ×2 IMPLANT
GOWN STRL REUS W/ TWL XL LVL3 (GOWN DISPOSABLE) ×1 IMPLANT
GOWN STRL REUS W/TWL LRG LVL3 (GOWN DISPOSABLE) ×6
GOWN STRL REUS W/TWL XL LVL3 (GOWN DISPOSABLE) ×3
NDL HYPO 25X1 1.5 SAFETY (NEEDLE) IMPLANT
NEEDLE HYPO 25X1 1.5 SAFETY (NEEDLE) ×3 IMPLANT
NS IRRIG 1000ML POUR BTL (IV SOLUTION) ×3 IMPLANT
PACK BASIN DAY SURGERY FS (CUSTOM PROCEDURE TRAY) ×3 IMPLANT
PAD CAST 3X4 CTTN HI CHSV (CAST SUPPLIES) IMPLANT
PADDING CAST ABS 3INX4YD NS (CAST SUPPLIES)
PADDING CAST ABS 4INX4YD NS (CAST SUPPLIES) ×2
PADDING CAST ABS COTTON 3X4 (CAST SUPPLIES) IMPLANT
PADDING CAST ABS COTTON 4X4 ST (CAST SUPPLIES) ×1 IMPLANT
PADDING CAST COTTON 3X4 STRL (CAST SUPPLIES)
STRIP CLOSURE SKIN 1/2X4 (GAUZE/BANDAGES/DRESSINGS) ×2 IMPLANT
SUT ETHILON 3 0 PS 1 (SUTURE) ×2 IMPLANT
SUT MNCRL AB 3-0 PS2 18 (SUTURE) IMPLANT
SUT MNCRL AB 4-0 PS2 18 (SUTURE) IMPLANT
SYR BULB 3OZ (MISCELLANEOUS) ×3 IMPLANT
SYR CONTROL 10ML LL (SYRINGE) ×4 IMPLANT
UNDERPAD 30X30 (UNDERPADS AND DIAPERS) ×3 IMPLANT

## 2019-04-19 NOTE — Anesthesia Procedure Notes (Signed)
Procedure Name: MAC Date/Time: 04/19/2019 8:53 AM Performed by: Wanita Chamberlain, CRNA Pre-anesthesia Checklist: Patient identified, Emergency Drugs available, Suction available, Patient being monitored and Timeout performed Patient Re-evaluated:Patient Re-evaluated prior to induction Oxygen Delivery Method: Nasal cannula Induction Type: IV induction Placement Confirmation: positive ETCO2,  CO2 detector and breath sounds checked- equal and bilateral Dental Injury: Teeth and Oropharynx as per pre-operative assessment

## 2019-04-19 NOTE — Anesthesia Postprocedure Evaluation (Signed)
Anesthesia Post Note  Patient: [name]  Procedure(s) Performed: MINOR RELEASE DORSAL COMPARTMENT (DEQUERVAINS) (Left Wrist)     Patient location during evaluation: PACU Anesthesia Type: MAC Level of consciousness: awake and alert and oriented Pain management: pain level controlled Vital Signs Assessment: post-procedure vital signs reviewed and stable Respiratory status: spontaneous breathing, nonlabored ventilation and respiratory function stable Cardiovascular status: blood pressure returned to baseline Postop Assessment: no apparent nausea or vomiting Anesthetic complications: no    Last Vitals:  Vitals:   04/19/19 0950 04/19/19 1000  BP:  (!) 142/83  Pulse: 60 (!) 55  Resp: 13 15  Temp:    SpO2: 97% 100%    Last Pain:  Vitals:   04/19/19 0950  TempSrc:   PainSc: 0-No pain                 Brennan Bailey

## 2019-04-19 NOTE — Op Note (Signed)
04/19/2019  9:37 AM  PATIENT:  Sandra Church    PRE-OPERATIVE DIAGNOSIS:  LEFT DEQUERVAINS  POST-OPERATIVE DIAGNOSIS:  Same  PROCEDURE:  MINOR RELEASE DORSAL COMPARTMENT (DEQUERVAINS)  SURGEON:  Renette Butters, MD  ASSISTANT: Nehemiah Massed, PA-C, he was present and scrubbed throughout the case, critical for completion in a timely fashion, and for retraction, instrumentation, and closure.   ANESTHESIA:   Mac and local  PREOPERATIVE INDICATIONS:  Sandra Church is a  59 y.o. female with a diagnosis of LEFT DEQUERVAINS who failed conservative measures and elected for surgical management.    The risks benefits and alternatives were discussed with the patient preoperatively including but not limited to the risks of infection, bleeding, nerve injury, cardiopulmonary complications, the need for revision surgery, among others, and the patient was willing to proceed.  OPERATIVE IMPLANTS: none  OPERATIVE FINDINGS: tight 1st dorsal compartment with tenosynovitis  BLOOD LOSS: min  COMPLICATIONS: none  TOURNIQUET TIME: 65mn  OPERATIVE PROCEDURE:  Patient was identified in the preoperative holding area and site was marked by me She was transported to the operating theater and placed on the table in supine position taking care to pad all bony prominences. After a preincinduction time out anesthesia was induced. The left upper extremity was prepped and draped in normal sterile fashion and a pre-incision timeout was performed. She received ancef for preoperative antibiotics.   I performed local anesthetic with 7cc of lidocaine/marcaine mixture.   I made a transverse incision and dissected to the 1st dorsal compartments.   I identified sensory radial nerve branches and protected and retracted these.  I then made a dorsal incision over the 1st dorsal compartment and released this.   I identified both Tendons of the compartment and confirmed no subsheath. These were then mobilized  and translated well.   Tenolysis was performed of the tenosynovitis.   I then irrigated and closed the skin incision.   Sterile dressing was placed.   POST OPERATIVE PLAN: dressings for 5days. Mobilized for dvt px.

## 2019-04-19 NOTE — Discharge Instructions (Signed)
Keep wrist elevated with ice as much as possible to reduce pain and swelling.  Diet: As you were doing prior to hospitalization   Shower / Dressing:  Leave dressing in place and dry until Saturday 04/23/19.  You may then remove dressings and shower over incision.  No bath or submerging incision.  Place clean Band-Aid over incision.  Activity:  Increase activity slowly as tolerated, but follow the weight bearing instructions below.  The rules on driving is that you can not be taking narcotics while you drive, and you must feel in control of the vehicle.    Weight Bearing:  Limit strenuous use of affected wrist.  No lifting >5 lbs.  To prevent constipation: you may use a stool softener such as -  Colace (over the counter) 100 mg by mouth twice a day  Drink plenty of fluids (prune juice may be helpful) and high fiber foods Miralax (over the counter) for constipation as needed.    Itching:  If you experience itching with your medications, try taking only a single pain pill, or even half a pain pill at a time.  You can also use benadryl over the counter for itching or also to help with sleep.   Precautions:  If you experience chest pain or shortness of breath - call 911 immediately for transfer to the hospital emergency department!!  If you develop a fever greater that 101 F, purulent drainage from wound, increased redness or drainage from wound, or calf pain -- Call the office at [phone]                                         Follow- Up Appointment:  Please call for an appointment to be seen in 2 weeks [place] - [phone]   Post Anesthesia Home Care Instructions  Activity: Get plenty of rest for the remainder of the day. A responsible individual must stay with you for 24 hours following the procedure.  For the next 24 hours, DO NOT: -Drive a car -Paediatric nurse -Drink alcoholic beverages -Take any medication unless instructed by your physician -Make any legal decisions  or sign important papers.  Meals: Start with liquid foods such as gelatin or soup. Progress to regular foods as tolerated. Avoid greasy, spicy, heavy foods. If nausea and/or vomiting occur, drink only clear liquids until the nausea and/or vomiting subsides. Call your physician if vomiting continues.  Special Instructions/Symptoms: Your throat may feel dry or sore from the anesthesia or the breathing tube placed in your throat during surgery. If this causes discomfort, gargle with warm salt water. The discomfort should disappear within 24 hours.  If you had a scopolamine patch placed behind your ear for the management of post- operative nausea and/or vomiting:  1. The medication in the patch is effective for 72 hours, after which it should be removed.  Wrap patch in a tissue and discard in the trash. Wash hands thoroughly with soap and water. 2. You may remove the patch earlier than 72 hours if you experience unpleasant side effects which may include dry mouth, dizziness or visual disturbances. 3. Avoid touching the patch. Wash your hands with soap and water after contact with the patch.

## 2019-04-19 NOTE — Interval H&P Note (Signed)
I participated in the care of this patient and agree with the above history, physical and evaluation. I performed a review of the history and a physical exam as detailed   Rylee Nuzum Daniel Heiress Williamson MD  

## 2019-04-19 NOTE — Transfer of Care (Signed)
Immediate Anesthesia Transfer of Care Note  Patient: [name]  Procedure(s) Performed: MINOR RELEASE DORSAL COMPARTMENT (DEQUERVAINS) (Left Wrist)  Patient Location: PACU  Anesthesia Type:MAC  Level of Consciousness: awake, alert , oriented and patient cooperative  Airway & Oxygen Therapy: Patient Spontanous Breathing and Patient connected to nasal cannula oxygen  Post-op Assessment: Report given to RN and Post -op Vital signs reviewed and stable  Post vital signs: Reviewed and stable  Last Vitals:  Vitals Value Taken Time  BP 121/75 04/19/19 0930  Temp    Pulse 55 04/19/19 0931  Resp 11 04/19/19 0931  SpO2 100 % 04/19/19 0931  Vitals shown include unvalidated device data.  Last Pain:  Vitals:   04/19/19 0647  TempSrc: Oral  PainSc: 0-No pain      Patients Stated Pain Goal: 5 (71/85/50 1586)  Complications: No apparent anesthesia complications

## 2019-04-23 ENCOUNTER — Ambulatory Visit: Payer: Medicaid Other | Attending: Internal Medicine

## 2019-04-23 DIAGNOSIS — Z23 Encounter for immunization: Secondary | ICD-10-CM

## 2019-04-23 NOTE — Progress Notes (Signed)
   Covid-19 Vaccination Clinic  Name:  Sandra Church    MRN: [id number] DOB: February 28, 1960  04/23/2019  [name] was observed post Covid-19 immunization for 15 minutes without incident. She was provided with Vaccine Information Sheet and instruction to access the V-Safe system.   [name] was instructed to call 911 with any severe reactions post vaccine: Marland Kitchen Difficulty breathing  . Swelling of face and throat  . A fast heartbeat  . A bad rash all over body  . Dizziness and weakness   Immunizations Administered    Name Date Dose VIS Date Route   Moderna COVID-19 Vaccine 04/23/2019  1:58 PM 0.5 mL 12/14/2018 Intramuscular   Manufacturer: Moderna   LotFP:[id number]   [id number]

## 2019-05-02 ENCOUNTER — Other Ambulatory Visit: Payer: Self-pay | Admitting: Gastroenterology

## 2019-05-09 ENCOUNTER — Other Ambulatory Visit: Payer: Self-pay | Admitting: Internal Medicine

## 2019-05-21 ENCOUNTER — Ambulatory Visit: Payer: Medicaid Other | Attending: Internal Medicine

## 2019-05-21 ENCOUNTER — Ambulatory Visit: Payer: Medicaid Other

## 2019-05-21 DIAGNOSIS — Z23 Encounter for immunization: Secondary | ICD-10-CM

## 2019-05-21 NOTE — Progress Notes (Signed)
   Covid-19 Vaccination Clinic  Name:  Sandra Church    MRN: [id number] DOB: [birth year]  05/21/2019  Sandra Church was observed post Covid-19 immunization for 30 minutes based on pre-vaccination screening without incident. She was provided with Vaccine Information Sheet and instruction to access the V-Safe system.   Sandra Church was instructed to call 911 with any severe reactions post vaccine: Marland Kitchen Difficulty breathing  . Swelling of face and throat  . A fast heartbeat  . A bad rash all over body  . Dizziness and weakness   Immunizations Administered    Name Date Dose VIS Date Route   Moderna COVID-19 Vaccine 05/21/2019 10:48 AM 0.5 mL 12/2018 Intramuscular   Manufacturer: Moderna   Lot: [id number]   [id number]

## 2019-06-08 ENCOUNTER — Other Ambulatory Visit: Payer: Self-pay | Admitting: Internal Medicine

## 2019-06-20 DIAGNOSIS — M5416 Radiculopathy, lumbar region: Secondary | ICD-10-CM | POA: Diagnosis not present
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
## 2019-07-06 ENCOUNTER — Encounter: Payer: Self-pay | Admitting: Internal Medicine

## 2019-07-06 ENCOUNTER — Ambulatory Visit: Payer: Medicaid Other | Admitting: Internal Medicine

## 2019-07-06 VITALS — BP 126/82 | HR 73 | Temp 98.0°F | Ht 66.0 in | Wt 195.9 lb

## 2019-07-06 DIAGNOSIS — I10 Essential (primary) hypertension: Secondary | ICD-10-CM | POA: Diagnosis not present

## 2019-07-06 DIAGNOSIS — K219 Gastro-esophageal reflux disease without esophagitis: Secondary | ICD-10-CM | POA: Diagnosis not present

## 2019-07-06 DIAGNOSIS — M654 Radial styloid tenosynovitis [de Quervain]: Secondary | ICD-10-CM | POA: Diagnosis not present

## 2019-07-06 DIAGNOSIS — Z0000 Encounter for general adult medical examination without abnormal findings: Secondary | ICD-10-CM

## 2019-07-06 DIAGNOSIS — Z9889 Other specified postprocedural states: Secondary | ICD-10-CM | POA: Diagnosis not present

## 2019-07-06 DIAGNOSIS — T300 Burn of unspecified body region, unspecified degree: Secondary | ICD-10-CM

## 2019-07-06 DIAGNOSIS — M797 Fibromyalgia: Secondary | ICD-10-CM | POA: Diagnosis not present

## 2019-07-06 DIAGNOSIS — T2104XA Burn of unspecified degree of lower back, initial encounter: Secondary | ICD-10-CM | POA: Diagnosis not present

## 2019-07-06 DIAGNOSIS — X19XXXA Contact with other heat and hot substances, initial encounter: Secondary | ICD-10-CM

## 2019-07-06 DIAGNOSIS — Z1231 Encounter for screening mammogram for malignant neoplasm of breast: Secondary | ICD-10-CM

## 2019-07-06 MED ORDER — CYCLOBENZAPRINE HCL 5 MG PO TABS: 5.0000 mg | ORAL_TABLET | Freq: Every evening | ORAL | 0 refills | Status: DC | PRN

## 2019-07-06 MED ORDER — METOPROLOL SUCCINATE ER 100 MG PO TB24: 100.0000 mg | ORAL_TABLET | Freq: Every morning | ORAL | 5 refills | Status: DC

## 2019-07-06 MED ORDER — OMEPRAZOLE 40 MG PO CPDR: 40.0000 mg | DELAYED_RELEASE_CAPSULE | Freq: Two times a day (BID) | ORAL | 1 refills | Status: DC

## 2019-07-06 MED ORDER — AMLODIPINE BESYLATE 5 MG PO TABS: 5.0000 mg | ORAL_TABLET | Freq: Every morning | ORAL | 5 refills | Status: DC

## 2019-07-06 MED ORDER — OLMESARTAN MEDOXOMIL 40 MG PO TABS: 40.0000 mg | ORAL_TABLET | Freq: Every day | ORAL | 5 refills | Status: DC
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
## 2019-07-08 ENCOUNTER — Encounter: Payer: Self-pay | Admitting: Internal Medicine

## 2019-07-08 DIAGNOSIS — T300 Burn of unspecified body region, unspecified degree: Secondary | ICD-10-CM

## 2019-07-08 HISTORY — DX: Burn of unspecified body region, unspecified degree: T30.0

## 2019-07-08 NOTE — Assessment & Plan Note (Signed)
-   due for yearly mammogram referral sent to Dr. Sandford Craze, her Ob-gyn where she has mammograms done

## 2019-07-08 NOTE — Assessment & Plan Note (Deleted)
Previously seen by GI with EGD done 11/24/2018 which was positive for H. Pylori. This was treated with antibiotic therapy. She is on omeprazole 40 mg bid. She feels like the medications help but she still sometimes has reflux sometimes when she eats certain foods.   - f/u with GI - didn't realize she was to follow-up in June. She will make appointment - cont. Omeprazole bid  - counseled on avoiding foods that worsen her reflux

## 2019-07-08 NOTE — Assessment & Plan Note (Signed)
She continues to have pain that she controls with ice and heat. She takes flexeril occasionally for the pain. She usually only takes 2.5 mg at a time, otherwise it makes her very tired.   - refill flexeril 5 mg qhs prn

## 2019-07-08 NOTE — Assessment & Plan Note (Signed)
BP Readings from Last 3 Encounters:  07/06/19 126/82  04/19/19 140/84  03/23/19 118/64   Medications include amlodipine 5 mg, toprol 100 mg qd, and benicar 40 mg qd. Blood pressure today well controlled. She has had no difficulties with her medications or symptoms of low blood pressure.   - cont. Current medications - refill toprol, amlodipine, and benicar - f/u three months for BMP
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
## 2019-07-08 NOTE — Assessment & Plan Note (Signed)
Previously seen by GI with EGD done 11/24/2018 which was positive for H. Pylori. This was treated with antibiotic therapy. She is on omeprazole 40 mg bid. She feels like the medications help but she still sometimes has reflux sometimes when she eats certain foods.   - f/u with GI - didn't realize she was to follow-up in June. She will make appointment - cont. Omeprazole bid  - counseled on avoiding foods that worsen her reflux

## 2019-07-08 NOTE — Assessment & Plan Note (Signed)
Now s/p surgery and symptoms have resolved.

## 2019-07-14 NOTE — Progress Notes (Signed)
Internal Medicine Clinic Attending  Case discussed with Dr. Seawell at the time of the visit.  We reviewed the resident's history and exam and pertinent patient test results.  I agree with the assessment, diagnosis, and plan of care documented in the resident's note.    

## 2019-07-15 DIAGNOSIS — M48061 Spinal stenosis, lumbar region without neurogenic claudication: Secondary | ICD-10-CM | POA: Diagnosis not present

## 2019-07-15 DIAGNOSIS — M5416 Radiculopathy, lumbar region: Secondary | ICD-10-CM | POA: Diagnosis not present

## 2019-07-15 DIAGNOSIS — M4316 Spondylolisthesis, lumbar region: Secondary | ICD-10-CM | POA: Diagnosis not present

## 2019-09-01 ENCOUNTER — Other Ambulatory Visit: Payer: Self-pay | Admitting: Internal Medicine

## 2019-09-01 DIAGNOSIS — M797 Fibromyalgia: Secondary | ICD-10-CM

## 2019-09-01 MED ORDER — CYCLOBENZAPRINE HCL 5 MG PO TABS: 5.0000 mg | ORAL_TABLET | Freq: Every evening | ORAL | 0 refills | Status: DC | PRN

## 2019-09-01 NOTE — Telephone Encounter (Signed)
#  30 with 5 refills sent 07/06/2019 for amlodipine, metoprolol, and Benicar. Will forward refill request on flexeril. Patient notified to contact pharmacy. She was very Patent attorney. Hubbard Hartshorn, BSN, RN-BC

## 2019-09-01 NOTE — Telephone Encounter (Signed)
Need refill on amLODipine (NORVASC) 5 MG tablet  metoprolol succinate (TOPROL-XL) 100 MG 24 hr tablet  cyclobenzaprine (FLEXERIL) 5 MG tablet olmesartan (BENICAR) 40 MG tablet  ;pt contact [phone]    [id number] - [place], [place] [address]

## 2019-09-13 ENCOUNTER — Other Ambulatory Visit: Payer: Self-pay | Admitting: Internal Medicine

## 2019-09-22 DIAGNOSIS — M5416 Radiculopathy, lumbar region: Secondary | ICD-10-CM | POA: Diagnosis not present

## 2019-10-07 ENCOUNTER — Encounter: Payer: Self-pay | Admitting: Internal Medicine

## 2019-10-07 ENCOUNTER — Other Ambulatory Visit: Payer: Self-pay | Admitting: Internal Medicine

## 2019-10-07 ENCOUNTER — Ambulatory Visit (INDEPENDENT_AMBULATORY_CARE_PROVIDER_SITE_OTHER): Payer: Medicaid Other | Admitting: Internal Medicine

## 2019-10-07 ENCOUNTER — Other Ambulatory Visit: Payer: Self-pay

## 2019-10-07 VITALS — BP 126/81 | HR 74 | Temp 97.7°F | Ht 66.0 in | Wt 196.3 lb

## 2019-10-07 DIAGNOSIS — Z13220 Encounter for screening for lipoid disorders: Secondary | ICD-10-CM | POA: Diagnosis not present

## 2019-10-07 DIAGNOSIS — D72829 Elevated white blood cell count, unspecified: Secondary | ICD-10-CM

## 2019-10-07 DIAGNOSIS — E7800 Pure hypercholesterolemia, unspecified: Secondary | ICD-10-CM | POA: Diagnosis not present

## 2019-10-07 DIAGNOSIS — Z0000 Encounter for general adult medical examination without abnormal findings: Secondary | ICD-10-CM

## 2019-10-07 DIAGNOSIS — I10 Essential (primary) hypertension: Secondary | ICD-10-CM

## 2019-10-07 DIAGNOSIS — Z0001 Encounter for general adult medical examination with abnormal findings: Secondary | ICD-10-CM

## 2019-10-07 DIAGNOSIS — M797 Fibromyalgia: Secondary | ICD-10-CM

## 2019-10-07 DIAGNOSIS — R3981 Functional urinary incontinence: Secondary | ICD-10-CM

## 2019-10-07 DIAGNOSIS — R0789 Other chest pain: Secondary | ICD-10-CM

## 2019-10-07 DIAGNOSIS — M5410 Radiculopathy, site unspecified: Secondary | ICD-10-CM | POA: Insufficient documentation

## 2019-10-07 DIAGNOSIS — K219 Gastro-esophageal reflux disease without esophagitis: Secondary | ICD-10-CM

## 2019-10-07 DIAGNOSIS — G8929 Other chronic pain: Secondary | ICD-10-CM | POA: Insufficient documentation

## 2019-10-07 DIAGNOSIS — R202 Paresthesia of skin: Secondary | ICD-10-CM

## 2019-10-07 DIAGNOSIS — M5417 Radiculopathy, lumbosacral region: Secondary | ICD-10-CM

## 2019-10-07 DIAGNOSIS — D649 Anemia, unspecified: Secondary | ICD-10-CM

## 2019-10-07 DIAGNOSIS — E559 Vitamin D deficiency, unspecified: Secondary | ICD-10-CM

## 2019-10-07 HISTORY — DX: Functional urinary incontinence: R39.81

## 2019-10-07 MED ORDER — CYCLOBENZAPRINE HCL 5 MG PO TABS: 5.0000 mg | ORAL_TABLET | Freq: Every evening | ORAL | 0 refills | Status: DC | PRN

## 2019-10-07 MED ORDER — OLMESARTAN MEDOXOMIL 40 MG PO TABS: 40.0000 mg | ORAL_TABLET | Freq: Every day | ORAL | 5 refills | Status: DC

## 2019-10-07 MED ORDER — METOPROLOL SUCCINATE ER 100 MG PO TB24: 100.0000 mg | ORAL_TABLET | Freq: Every morning | ORAL | 5 refills | Status: DC

## 2019-10-07 MED ORDER — AMLODIPINE BESYLATE 5 MG PO TABS: 5.0000 mg | ORAL_TABLET | Freq: Every morning | ORAL | 5 refills | Status: DC

## 2019-10-07 NOTE — Progress Notes (Signed)
   CC: hip pain  HPI:  Ms.Jeanifer L [place]el is a 59 y.o. with PMH as below.   Please see A&P for assessment of the patient's acute and chronic medical conditions.   Past Medical History:  Diagnosis Date  . Anemia   . Anxiety   . Arthritis   . Atrophic endometrium 12/18/2016  . De Quervain's disease (tenosynovitis) 01/17/2019  . Fibromyalgia   . GERD (gastroesophageal reflux disease)   . Heart murmur    yrs ago, heart murmur gone now  . History of kidney stones   . Hyperlipidemia   . Hypertension   . Jaundice    AGE 12 OR 21, food poisioning  . 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 MVP (mitral valve prolapse)   . Postmenopausal bleeding 12/31/2016  . Stable angina (HCC)    per pt no chest pain since 2017  . Status post right foot surgery 04/12/2014  . Urinary incontinence 10/13/2018  . Wears glasses   . Wears partial dentures    UPPER   Review of Systems:   Review of Systems  Constitutional: Negative for chills and fever.  Respiratory: Negative for cough and wheezing.   Cardiovascular: Negative for chest pain, palpitations and leg swelling.  Gastrointestinal: Negative for abdominal pain, blood in stool, constipation, diarrhea, melena and nausea.  Genitourinary: Negative for dysuria, frequency and urgency.  Musculoskeletal: Positive for back pain, joint pain and myalgias. Negative for falls.  Neurological: Positive for tingling. Negative for dizziness, sensory change, focal weakness and headaches.   Physical Exam: Constitution: NAD, appears stated age Cardio: RRR, no m/r/g, no LE edema  Respiratory: CTA, no w/r/r Abdominal: NTTP, soft, non-distended MSK: moving all extremities Neuro: normal affect, a&ox3, pleasant  Skin: c/d/i    Vitals:   10/07/19 1031  BP: 126/81  Pulse: 74  Temp: 97.7 F (36.5 C)  TempSrc: Oral  SpO2: 99%  Weight: 196 lb 4.8 oz (89 kg)    Assessment & Plan:   See Encounters Tab for problem based charting.  Patient discussed with Dr.  Dareen Piano

## 2019-10-07 NOTE — Assessment & Plan Note (Signed)
This problem is chronic and stable. Symptoms controlled unless she eats certain foods she knows she shouldn't. She was told by GI to follow-up if she has worsening GERD symptoms of abdominal pain again.   - continue prilosec 40 mg bid

## 2019-10-07 NOTE — Assessment & Plan Note (Addendum)
Her chronic left sided back/hip pain has been much worse recently. There are no new symptoms, it is just the same but worse. She sees [facility]. She last had an injection one month ago, but this has not lasted as long as usual. She will follow-up with them on October 9th as she is having so much pain.  She had a lumbar MRI in 2019 and was told that she would not really benefit from surgery due to the condition of her spine.   - discussed possible PT, she states she has done a lot of PT, which hasn't really help. She alternates ice and heat and takes tylenol prn. She is not taking NSAIDs  - will request

## 2019-10-07 NOTE — Assessment & Plan Note (Signed)
Due to her chronic back pain she has trouble making it to the bathroom on time. No symptoms of urge or stress incontinence or overflow, she just cannot make it in time if she is in bed.   - discussed not drinking fluids two hours before bed time

## 2019-10-07 NOTE — Assessment & Plan Note (Signed)
BP Readings from Last 3 Encounters:  10/07/19 126/81  07/06/19 126/82  04/19/19 140/84   Blood pressure currently controlled on norvasc 5 mg, toprol 100 mg qam, and benicar 40 mg qd.   - refill medications - bmp

## 2019-10-07 NOTE — Patient Instructions (Addendum)
Thank you for allowing [place] to provide your care today. Today we discussed     I have ordered the following labs for you:  Basic metabolic panel, lipid panel, complete blood count   I will call if any are abnormal.     Please follow-up in six months.    Please call the internal medicine center clinic if you have any questions or concerns, we may be able to help and keep you from a long and expensive emergency room wait. Our clinic and after hours phone number is [phone], the best time to call is Monday through Friday 9 am to 4 pm but there is always someone available 24/7 if you have an emergency. If you need medication refills please notify your pharmacy one week in advance and they will send [place] a request.

## 2019-10-07 NOTE — Assessment & Plan Note (Addendum)
-   flu shot today - lipid panel

## 2019-10-08 LAB — LIPID PANEL
Chol/HDL Ratio: 4.7 ratio — ABNORMAL HIGH (ref 0.0–4.4)
Cholesterol, Total: 207 mg/dL — ABNORMAL HIGH (ref 100–199)
HDL: 44 mg/dL (ref >39)
LDL Chol Calc (NIH): 142 mg/dL — ABNORMAL HIGH (ref 0–99)
Triglycerides: 114 mg/dL (ref 0–149)
VLDL Cholesterol Cal: 21 mg/dL (ref 5–40)

## 2019-10-08 LAB — CBC
Hematocrit: 39.2 % (ref 34.0–46.6)
Hemoglobin: 13.2 g/dL (ref 11.1–15.9)
MCH: 32 pg (ref 26.6–33.0)
MCHC: 33.7 g/dL (ref 31.5–35.7)
MCV: 95 fL (ref 79–97)
Platelets: 423 10*3/uL (ref 150–450)
RBC: 4.13 x10E6/uL (ref 3.77–5.28)
RDW: 12.9 % (ref 11.7–15.4)
WBC: 11.6 10*3/uL — ABNORMAL HIGH (ref 3.4–10.8)

## 2019-10-08 LAB — BMP8+ANION GAP
Anion Gap: 16 mmol/L (ref 10.0–18.0)
BUN/Creatinine Ratio: 17 (ref 12–28)
BUN: 14 mg/dL (ref 8–27)
CO2: 24 mmol/L (ref 20–29)
Calcium: 9.8 mg/dL (ref 8.7–10.3)
Chloride: 102 mmol/L (ref 96–106)
Creatinine, Ser: 0.81 mg/dL (ref 0.57–1.00)
GFR calc Af Amer: 91 mL/min/{1.73_m2} (ref >59)
GFR calc non Af Amer: 79 mL/min/{1.73_m2} (ref >59)
Glucose: 94 mg/dL (ref 65–99)
Potassium: 4.1 mmol/L (ref 3.5–5.2)
Sodium: 142 mmol/L (ref 134–144)

## 2019-10-08 LAB — VITAMIN D 25 HYDROXY (VIT D DEFICIENCY, FRACTURES): Vit D, 25-Hydroxy: 21.6 ng/mL — ABNORMAL LOW (ref 30.0–100.0)

## 2019-10-10 DIAGNOSIS — D72829 Elevated white blood cell count, unspecified: Secondary | ICD-10-CM | POA: Insufficient documentation

## 2019-10-10 MED ORDER — CHOLECALCIFEROL 20 MCG (800 UNIT) PO TABS: 1.0000 | ORAL_TABLET | Freq: Every day | ORAL | 3 refills | Status: DC

## 2019-10-10 MED ORDER — ATORVASTATIN CALCIUM 20 MG PO TABS: 20.0000 mg | ORAL_TABLET | Freq: Every day | ORAL | 1 refills | Status: DC

## 2019-10-10 NOTE — Assessment & Plan Note (Signed)
Elevated LDL and cholesterol. Will start atorvastatin 20 mg qd.

## 2019-10-10 NOTE — Assessment & Plan Note (Signed)
ADDENDUM: Mildly elevated leukocytosis. No infectious etiology. Likely benign but had elevated lymphocytosis last year, although this was overall thought to be due to infection. Would like to review lymphocyte count and blood smear.

## 2019-10-10 NOTE — Addendum Note (Signed)
Addended by: Molli Hazard A on: 10/10/2019 12:58 PM   Modules accepted: Orders

## 2019-10-10 NOTE — Addendum Note (Signed)
Addended by: Molli Hazard A on: 10/10/2019 12:53 PM   Modules accepted: Orders

## 2019-10-10 NOTE — Addendum Note (Signed)
Addended by: Truddie Crumble on: 10/10/2019 05:05 PM   Modules accepted: Orders

## 2019-10-10 NOTE — Addendum Note (Signed)
Addended by: Truddie Crumble on: 10/10/2019 04:50 PM   Modules accepted: Orders

## 2019-10-14 ENCOUNTER — Other Ambulatory Visit (INDEPENDENT_AMBULATORY_CARE_PROVIDER_SITE_OTHER): Payer: Medicaid Other

## 2019-10-14 ENCOUNTER — Other Ambulatory Visit: Payer: Self-pay

## 2019-10-14 ENCOUNTER — Ambulatory Visit (INDEPENDENT_AMBULATORY_CARE_PROVIDER_SITE_OTHER): Payer: Medicaid Other | Admitting: *Deleted

## 2019-10-14 DIAGNOSIS — Z23 Encounter for immunization: Secondary | ICD-10-CM

## 2019-10-14 DIAGNOSIS — D72829 Elevated white blood cell count, unspecified: Secondary | ICD-10-CM | POA: Diagnosis present

## 2019-10-14 LAB — CBC WITH DIFFERENTIAL/PLATELET
Abs Immature Granulocytes: 0.04 10*3/uL (ref 0.00–0.07)
Basophils Absolute: 0.1 10*3/uL (ref 0.0–0.1)
Basophils Relative: 1 %
Eosinophils Absolute: 0.5 10*3/uL (ref 0.0–0.5)
Eosinophils Relative: 6 %
HCT: 39.6 % (ref 36.0–46.0)
Hemoglobin: 12.8 g/dL (ref 12.0–15.0)
Immature Granulocytes: 1 %
Lymphocytes Relative: 48 %
Lymphs Abs: 3.9 10*3/uL (ref 0.7–4.0)
MCH: 32.1 pg (ref 26.0–34.0)
MCHC: 32.3 g/dL (ref 30.0–36.0)
MCV: 99.2 fL (ref 80.0–100.0)
Monocytes Absolute: 0.6 10*3/uL (ref 0.1–1.0)
Monocytes Relative: 8 %
Neutro Abs: 2.8 10*3/uL (ref 1.7–7.7)
Neutrophils Relative %: 36 %
Platelets: 360 10*3/uL (ref 150–400)
RBC: 3.99 MIL/uL (ref 3.87–5.11)
RDW: 13.6 % (ref 11.5–15.5)
WBC: 7.9 10*3/uL (ref 4.0–10.5)
nRBC: 0 % (ref 0.0–0.2)

## 2019-10-17 NOTE — Progress Notes (Signed)
Internal Medicine Clinic Attending  Case discussed with Dr. Seawell  At the time of the visit.  We reviewed the resident's history and exam and pertinent patient test results.  I agree with the assessment, diagnosis, and plan of care documented in the resident's note.  

## 2019-10-18 LAB — PATHOLOGIST SMEAR REVIEW

## 2019-10-24 DIAGNOSIS — M4316 Spondylolisthesis, lumbar region: Secondary | ICD-10-CM | POA: Diagnosis not present

## 2019-10-24 DIAGNOSIS — M48061 Spinal stenosis, lumbar region without neurogenic claudication: Secondary | ICD-10-CM | POA: Diagnosis not present

## 2019-11-29 ENCOUNTER — Other Ambulatory Visit: Payer: Self-pay

## 2019-11-29 MED ORDER — DICLOFENAC SODIUM 1 % EX GEL
2.0000 g | Freq: Four times a day (QID) | CUTANEOUS | 3 refills | Status: DC
Start: 2019-11-29 — End: 2020-05-04

## 2019-11-29 NOTE — Telephone Encounter (Signed)
diclofenac Sodium (VOLTAREN) 1 % GEL, REFILL REQUEST @  [id number] - [place], [place] - [place] [address] Phone:  [phone]  Fax:  [phone]

## 2019-12-05 DIAGNOSIS — Z1231 Encounter for screening mammogram for malignant neoplasm of breast: Secondary | ICD-10-CM | POA: Diagnosis not present

## 2019-12-05 DIAGNOSIS — N898 Other specified noninflammatory disorders of vagina: Secondary | ICD-10-CM | POA: Diagnosis not present

## 2019-12-05 DIAGNOSIS — R319 Hematuria, unspecified: Secondary | ICD-10-CM | POA: Diagnosis not present

## 2019-12-05 DIAGNOSIS — Z1151 Encounter for screening for human papillomavirus (HPV): Secondary | ICD-10-CM | POA: Diagnosis not present

## 2019-12-05 DIAGNOSIS — Z130 Encounter for screening for diseases of the blood and blood-forming organs and certain disorders involving the immune mechanism: Secondary | ICD-10-CM | POA: Diagnosis not present

## 2019-12-05 DIAGNOSIS — Z01419 Encounter for gynecological examination (general) (routine) without abnormal findings: Secondary | ICD-10-CM | POA: Diagnosis not present

## 2019-12-05 DIAGNOSIS — M797 Fibromyalgia: Secondary | ICD-10-CM | POA: Diagnosis not present

## 2019-12-05 DIAGNOSIS — Z6831 Body mass index (BMI) 31.0-31.9, adult: Secondary | ICD-10-CM | POA: Diagnosis not present

## 2019-12-05 DIAGNOSIS — M545 Low back pain, unspecified: Secondary | ICD-10-CM | POA: Diagnosis not present

## 2019-12-05 DIAGNOSIS — Z1389 Encounter for screening for other disorder: Secondary | ICD-10-CM | POA: Diagnosis not present

## 2019-12-05 DIAGNOSIS — Z124 Encounter for screening for malignant neoplasm of cervix: Secondary | ICD-10-CM | POA: Diagnosis not present

## 2019-12-22 DIAGNOSIS — M5416 Radiculopathy, lumbar region: Secondary | ICD-10-CM | POA: Diagnosis not present

## 2020-01-24 IMAGING — DX DG LUMBAR SPINE COMPLETE 4+V
5 series · 5 of 5 positions shown · non-contrast
Comparison: CT of the abdomen and pelvis 11/24/2007

CLINICAL DATA: Right-sided low back pain extending into the hips.
Pain goes into the right lower extremity to the foot.

EXAM:
LUMBAR SPINE - COMPLETE 4+ VIEW

[l-spine ap]
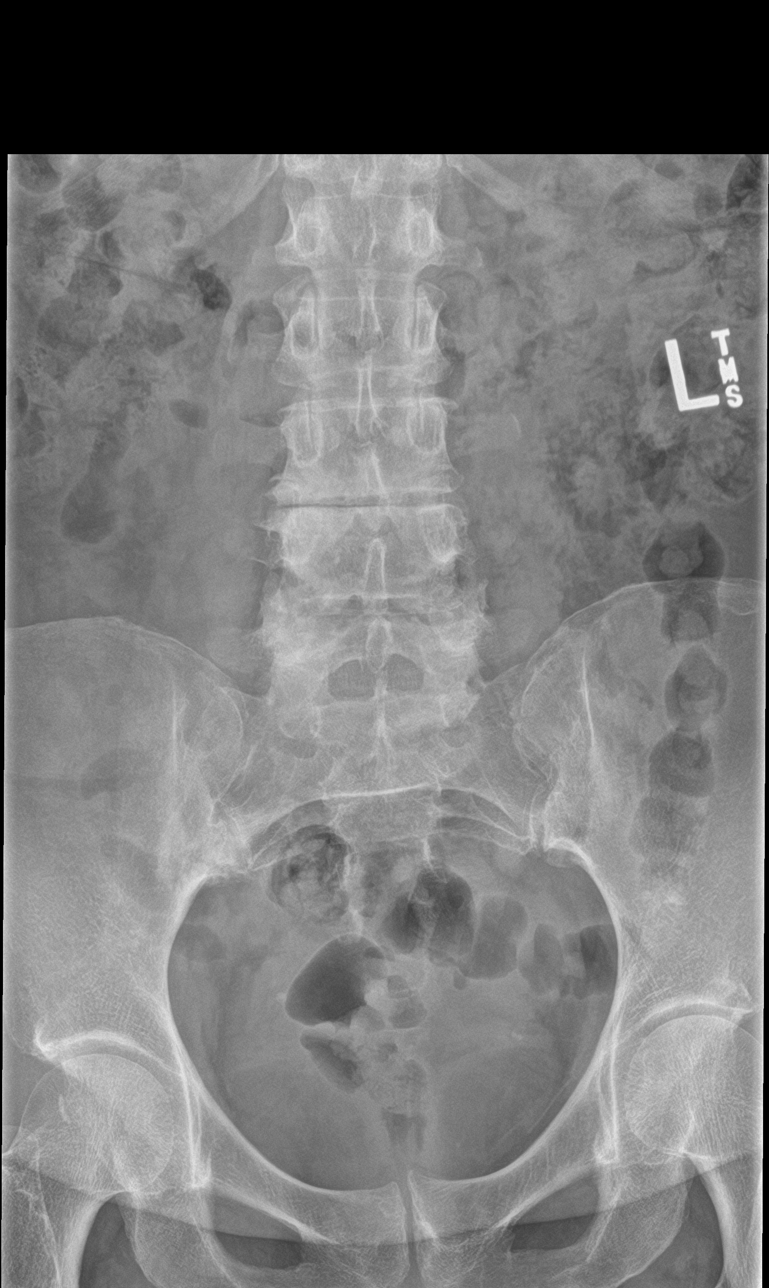

[l-spine obl (1 of 2)]
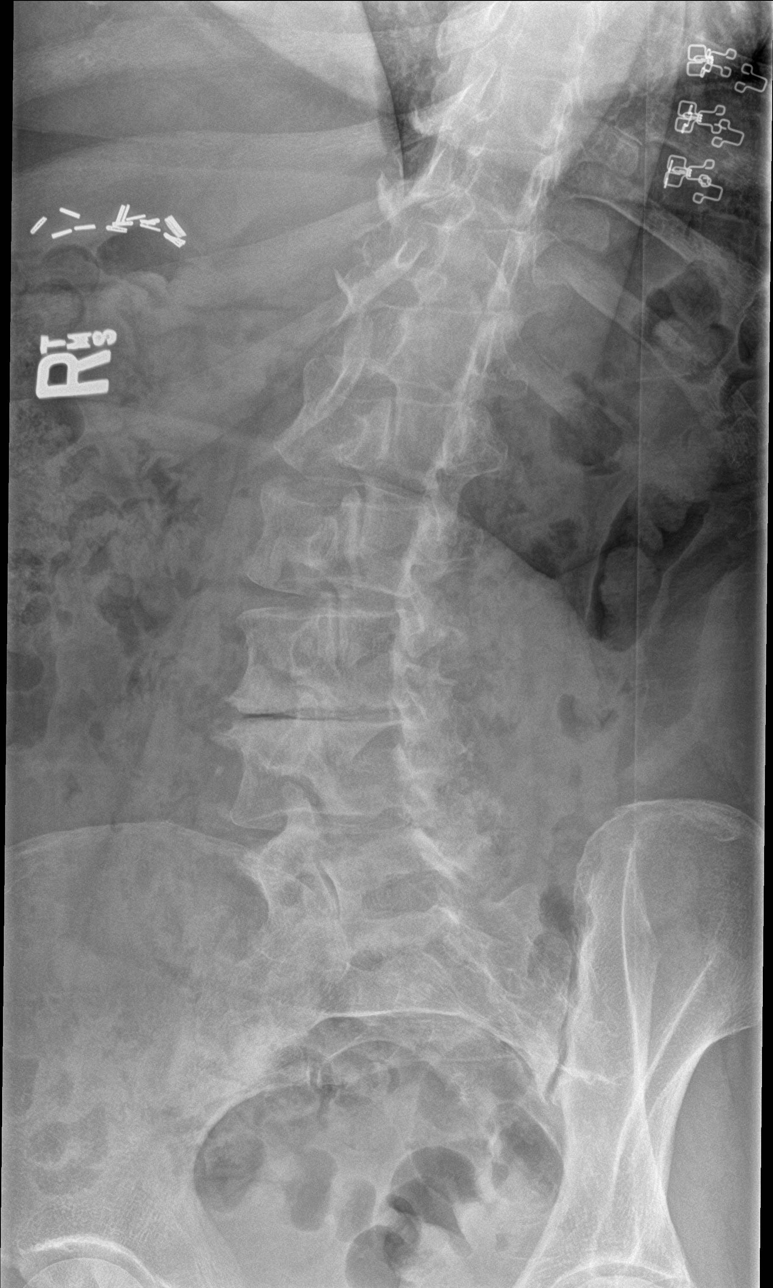

[l-spine obl (2 of 2)]
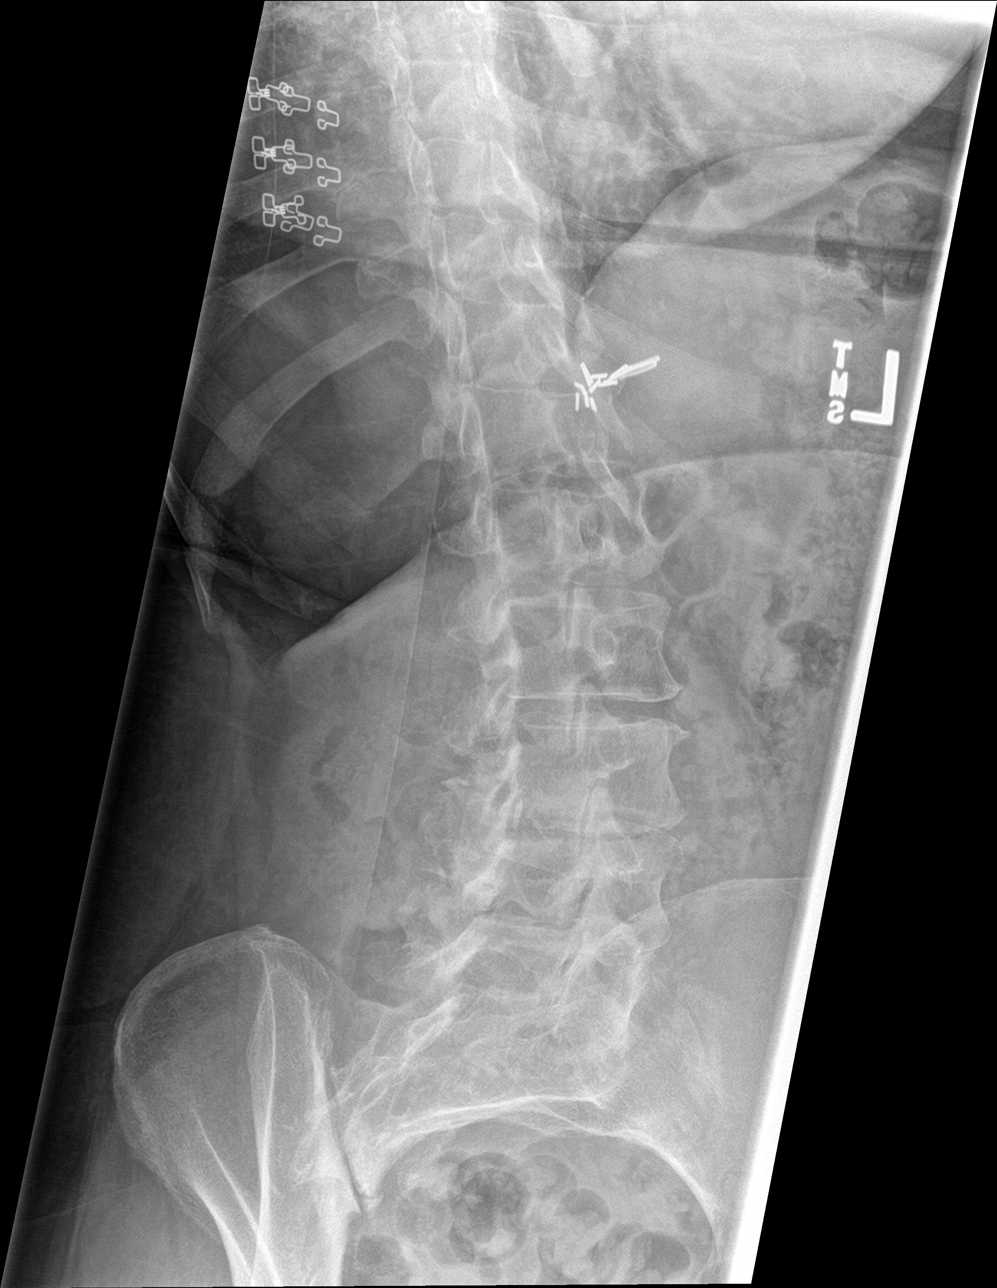

[l-spine lat]
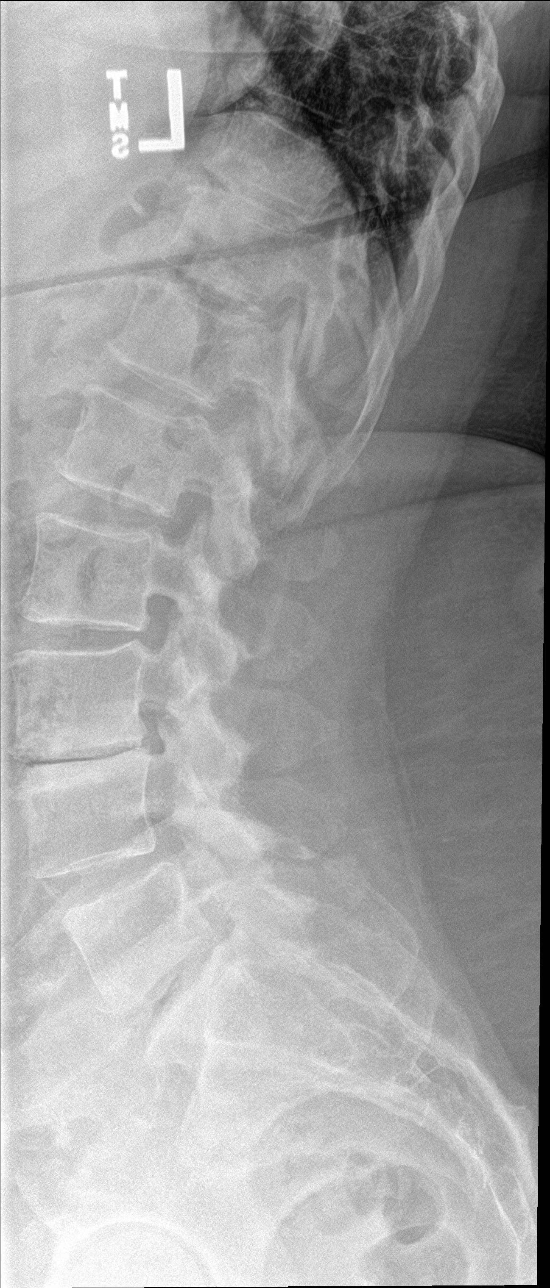

[l-spine spot]
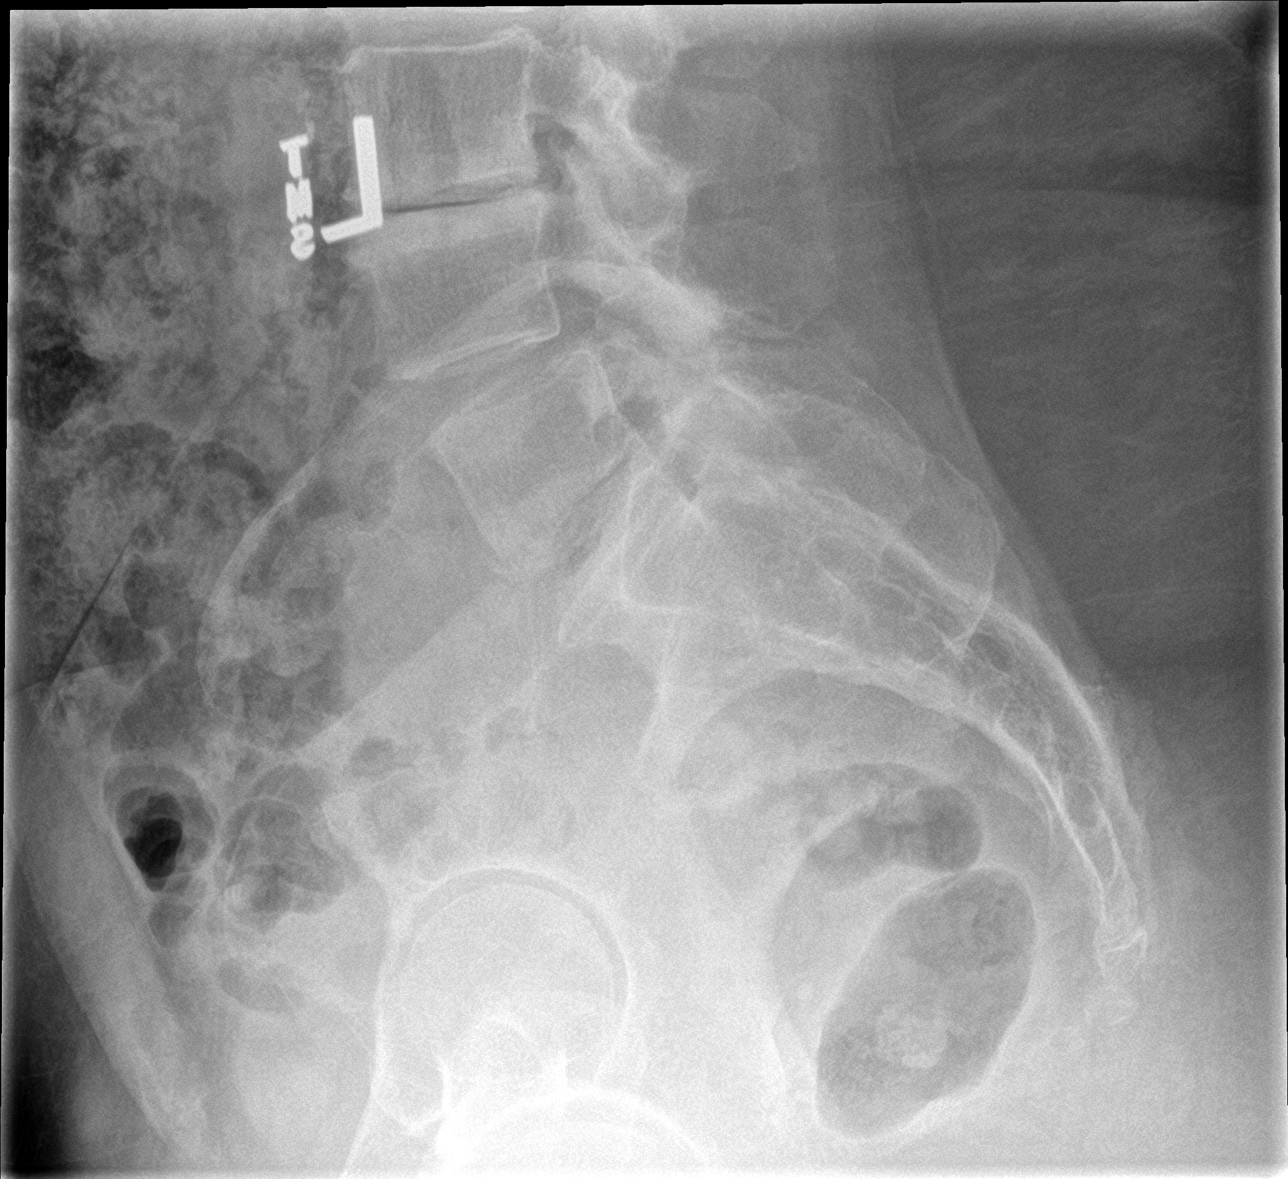

[5 of 5 positions shown; findings below may reference images not displayed]

FINDINGS: Five non rib-bearing lumbar type vertebral bodies are present. Grade
1 anterolisthesis at L4-5 is secondary to progressive advanced facet
hypertrophy at this level. There is chronic asymmetric right-sided
endplate change at L3-4 with vacuum disc. Mild facet hypertrophy is
also present at L3-4 bilaterally.
IMPRESSION: 1. Grade 1 anterolisthesis is associated with moderate bilateral
facet hypertrophy at L4-5.
2. Chronic asymmetric right-sided degenerative disc disease with
endplate change at L3-4.

## 2020-02-07 DIAGNOSIS — M48061 Spinal stenosis, lumbar region without neurogenic claudication: Secondary | ICD-10-CM | POA: Diagnosis not present

## 2020-02-07 DIAGNOSIS — M5416 Radiculopathy, lumbar region: Secondary | ICD-10-CM | POA: Diagnosis not present

## 2020-03-13 ENCOUNTER — Ambulatory Visit (HOSPITAL_COMMUNITY)
Admission: RE | Admit: 2020-03-13 | Discharge: 2020-03-13 | Disposition: A | Discharge status: Home / Self Care | Payer: Medicaid Other | Source: Ambulatory Visit | Attending: Internal Medicine | Admitting: Internal Medicine

## 2020-03-13 ENCOUNTER — Ambulatory Visit (INDEPENDENT_AMBULATORY_CARE_PROVIDER_SITE_OTHER): Payer: Medicaid Other | Admitting: Internal Medicine

## 2020-03-13 ENCOUNTER — Other Ambulatory Visit: Payer: Self-pay

## 2020-03-13 VITALS — BP 128/89 | HR 77 | Temp 98.0°F | Wt 204.3 lb

## 2020-03-13 DIAGNOSIS — M67922 Unspecified disorder of synovium and tendon, left upper arm: Secondary | ICD-10-CM | POA: Diagnosis present

## 2020-03-13 DIAGNOSIS — K219 Gastro-esophageal reflux disease without esophagitis: Secondary | ICD-10-CM | POA: Diagnosis not present

## 2020-03-13 DIAGNOSIS — E782 Mixed hyperlipidemia: Secondary | ICD-10-CM

## 2020-03-13 DIAGNOSIS — M7702 Medial epicondylitis, left elbow: Secondary | ICD-10-CM | POA: Insufficient documentation

## 2020-03-13 DIAGNOSIS — M797 Fibromyalgia: Secondary | ICD-10-CM | POA: Diagnosis not present

## 2020-03-13 DIAGNOSIS — I10 Essential (primary) hypertension: Secondary | ICD-10-CM | POA: Diagnosis not present

## 2020-03-13 DIAGNOSIS — E785 Hyperlipidemia, unspecified: Secondary | ICD-10-CM | POA: Insufficient documentation

## 2020-03-13 DIAGNOSIS — M25522 Pain in left elbow: Secondary | ICD-10-CM | POA: Diagnosis not present

## 2020-03-13 HISTORY — DX: Medial epicondylitis, left elbow: M77.02

## 2020-03-13 MED ORDER — OMEPRAZOLE 40 MG PO CPDR: 40.0000 mg | DELAYED_RELEASE_CAPSULE | Freq: Two times a day (BID) | ORAL | 3 refills | Status: DC

## 2020-03-13 MED ORDER — METHOCARBAMOL 500 MG PO TABS: 500.0000 mg | ORAL_TABLET | Freq: Two times a day (BID) | ORAL | 2 refills | Status: DC | PRN

## 2020-03-13 NOTE — Assessment & Plan Note (Signed)
Atorvastatin 20 mg qd started during last visit 09/2019.   - reassess lipid panel
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
## 2020-03-13 NOTE — Assessment & Plan Note (Signed)
She has been trying to take a half pill of the flexeril as she does not like the way it makes her feel. She is wondering if there is something else she can try. She does use ice, heat, and tylenol. She has stopped BC powders. She uses voltaren gel. She has used gabapentin and amitriptyline in the past and did not think that helped. Discussed that alternative medications also may have similar adverse effects to flexeril. We will try a different muscle relaxer and see if this helps.   - switch to robaxin 500 mg bid prn, can increase if needed

## 2020-03-13 NOTE — Patient Instructions (Signed)
Thank you for allowing [place] to provide your care today. Today we discussed your golfer's elbow (medial elbow tendinitis).   I have ordered the following labs for you:  BMP, lipid panel    I will call if any are abnormal.    Today we made the following changes to your medications:   Please START taking  Robaxin 500 mg - take one tablet two times per day as needed for muscle spasms and pain   Please STOP taking   Flexeril    Please buy a Counterforce Brace to assist with your elbow pain. If pain continues to persist, I would recommend a referral to sports medicine or physical therapy.   Please continue to use ice and tylenol as you have been doing. Try to rest the elbow and not lift heavy things.   I have also ordered an xray due to the swelling that is present.   Please follow-up in one year.    Please call the internal medicine center clinic if you have any questions or concerns, we may be able to help and keep you from a long and expensive emergency room wait. Our clinic and after hours phone number is [phone], the best time to call is Monday through Friday 9 am to 4 pm but there is always someone available 24/7 if you have an emergency. If you need medication refills please notify your pharmacy one week in advance and they will send [place] a request.

## 2020-03-13 NOTE — Progress Notes (Signed)
   CC: left elbow pain  HPI:  Sandra Church is a 60 y.o. with PMH as below.   Please see A&P for assessment of the patient's acute and chronic medical conditions.   Past Medical History:  Diagnosis Date  . Anemia   . Anxiety   . Arthritis   . Atrophic endometrium 12/18/2016  . De Quervain's disease (tenosynovitis) 01/17/2019  . Fibromyalgia   . GERD (gastroesophageal reflux disease)   . Heart murmur    yrs ago, heart murmur gone now  . History of kidney stones   . Hyperlipidemia   . Hypertension   . Jaundice    AGE 32 OR 21, food poisioning  . 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 MVP (mitral valve prolapse)   . Postmenopausal bleeding 12/31/2016  . Stable angina (HCC)    per pt no chest pain since 2017  . Status post right foot surgery 04/12/2014  . Urinary incontinence 10/13/2018  . Wears glasses   . Wears partial dentures    UPPER   Review of Systems:  Review of Systems  Constitutional: Negative for chills and fever.  Eyes: Negative for blurred vision and double vision.  Respiratory: Negative for cough and shortness of breath.   Cardiovascular: Negative for chest pain, palpitations and leg swelling.  Gastrointestinal: Negative for abdominal pain, constipation, diarrhea and heartburn.  Genitourinary: Negative for dysuria, flank pain, frequency and hematuria.  Musculoskeletal: Positive for back pain, joint pain and myalgias. Negative for falls.  Neurological: Negative for dizziness, weakness and headaches.     Physical Exam: Constitution: NAD, appears stated age HENT: [place]/AT  Eyes: no icterus or injection  Cardio: RRR, no m/r/g, no LE edema  Respiratory: CTA, no w/r/r Abdominal: NTTP, soft, non-distended, normal BS  MSK: medial left elbow joint swelling, TTP. Pain with active and passive ROM of elbow flexion and internal rotation  Neuro: normal affect, a&ox3 Skin: c/d/i    Vitals:   03/13/20 0847  BP: 128/89  Pulse: 77  Temp: 98 F (36.7 C)  TempSrc:  Oral  SpO2: 100%  Weight: 204 lb 4.8 oz (92.7 kg)     Assessment & Plan:   See Encounters Tab for problem based charting.  Patient discussed with Dr. Jimmye Norman

## 2020-03-13 NOTE — Assessment & Plan Note (Signed)
BP Readings from Last 3 Encounters:  03/13/20 128/89  10/07/19 126/81  07/06/19 126/82   Current medications include toprol-xl 100 mg q24h, benicar 40 mg qd, and norvasc 5 mg. Blood pressure well controlled.   - check BMP today  - f/u in one year

## 2020-03-14 LAB — LIPID PANEL
Chol/HDL Ratio: 2.8 ratio (ref 0.0–4.4)
Cholesterol, Total: 156 mg/dL (ref 100–199)
HDL: 56 mg/dL (ref >39)
LDL Chol Calc (NIH): 84 mg/dL (ref 0–99)
Triglycerides: 83 mg/dL (ref 0–149)
VLDL Cholesterol Cal: 16 mg/dL (ref 5–40)

## 2020-03-14 LAB — BMP8+ANION GAP
Anion Gap: 18 mmol/L (ref 10.0–18.0)
BUN/Creatinine Ratio: 19 (ref 12–28)
BUN: 14 mg/dL (ref 8–27)
CO2: 21 mmol/L (ref 20–29)
Calcium: 9.9 mg/dL (ref 8.7–10.3)
Chloride: 102 mmol/L (ref 96–106)
Creatinine, Ser: 0.75 mg/dL (ref 0.57–1.00)
Glucose: 103 mg/dL — ABNORMAL HIGH (ref 65–99)
Potassium: 3.9 mmol/L (ref 3.5–5.2)
Sodium: 141 mmol/L (ref 134–144)
eGFR: 91 mL/min/{1.73_m2} (ref >59)

## 2020-03-14 MED ORDER — OXYCODONE HCL 5 MG PO TABS: 5.0000 mg | ORAL_TABLET | Freq: Three times a day (TID) | ORAL | 0 refills | Status: AC | PRN

## 2020-03-14 NOTE — Progress Notes (Signed)
Internal Medicine Clinic Attending  Case discussed with Dr. Seawell  At the time of the visit.  We reviewed the resident's history and exam and pertinent patient test results.  I agree with the assessment, diagnosis, and plan of care documented in the resident's note.  

## 2020-03-14 NOTE — Addendum Note (Signed)
Addended by: Molli Hazard A on: 03/14/2020 07:43 AM   Modules accepted: Orders

## 2020-03-22 DIAGNOSIS — M5416 Radiculopathy, lumbar region: Secondary | ICD-10-CM | POA: Diagnosis not present

## 2020-04-06 ENCOUNTER — Other Ambulatory Visit: Payer: Self-pay | Admitting: Internal Medicine

## 2020-04-06 DIAGNOSIS — E7800 Pure hypercholesterolemia, unspecified: Secondary | ICD-10-CM

## 2020-04-25 DIAGNOSIS — M48061 Spinal stenosis, lumbar region without neurogenic claudication: Secondary | ICD-10-CM | POA: Diagnosis not present

## 2020-04-25 DIAGNOSIS — M5416 Radiculopathy, lumbar region: Secondary | ICD-10-CM | POA: Diagnosis not present

## 2020-05-04 ENCOUNTER — Other Ambulatory Visit: Payer: Self-pay | Admitting: Internal Medicine

## 2020-05-31 ENCOUNTER — Other Ambulatory Visit: Payer: Self-pay | Admitting: Internal Medicine

## 2020-05-31 DIAGNOSIS — I10 Essential (primary) hypertension: Secondary | ICD-10-CM

## 2020-06-29 ENCOUNTER — Other Ambulatory Visit: Payer: Self-pay | Admitting: Student

## 2020-06-29 ENCOUNTER — Encounter: Payer: Self-pay | Admitting: *Deleted

## 2020-06-29 ENCOUNTER — Telehealth: Payer: Self-pay

## 2020-06-29 ENCOUNTER — Other Ambulatory Visit: Payer: Self-pay | Admitting: Internal Medicine

## 2020-06-29 DIAGNOSIS — I10 Essential (primary) hypertension: Secondary | ICD-10-CM

## 2020-06-29 NOTE — Telephone Encounter (Signed)
Received refill request for Amlodipine, Metoprolol, and Olmesartan from pharmacy via [place]. This was sent to MD Encounter closed [place], RN,BSN

## 2020-06-29 NOTE — Telephone Encounter (Signed)
Requesting all meds to be filled @  [id number] - [place], [place] - [address] Phone:  [phone]  Fax:  [phone]    Per patients the doctor who will write the Rx needs to be medicaid approval.

## 2020-07-05 DIAGNOSIS — E785 Hyperlipidemia, unspecified: Secondary | ICD-10-CM | POA: Diagnosis not present

## 2020-07-05 DIAGNOSIS — I10 Essential (primary) hypertension: Secondary | ICD-10-CM | POA: Diagnosis not present

## 2020-07-11 DIAGNOSIS — M5416 Radiculopathy, lumbar region: Secondary | ICD-10-CM | POA: Diagnosis not present

## 2020-07-11 DIAGNOSIS — M1990 Unspecified osteoarthritis, unspecified site: Secondary | ICD-10-CM | POA: Diagnosis not present

## 2020-07-11 DIAGNOSIS — I10 Essential (primary) hypertension: Secondary | ICD-10-CM | POA: Diagnosis not present

## 2020-07-11 DIAGNOSIS — I208 Other forms of angina pectoris: Secondary | ICD-10-CM | POA: Diagnosis not present

## 2020-07-11 DIAGNOSIS — E785 Hyperlipidemia, unspecified: Secondary | ICD-10-CM | POA: Diagnosis not present

## 2020-07-11 DIAGNOSIS — K219 Gastro-esophageal reflux disease without esophagitis: Secondary | ICD-10-CM | POA: Diagnosis not present

## 2020-07-31 ENCOUNTER — Other Ambulatory Visit: Payer: Self-pay | Admitting: Internal Medicine

## 2020-07-31 DIAGNOSIS — K219 Gastro-esophageal reflux disease without esophagitis: Secondary | ICD-10-CM

## 2020-08-10 DIAGNOSIS — M48061 Spinal stenosis, lumbar region without neurogenic claudication: Secondary | ICD-10-CM | POA: Diagnosis not present

## 2020-08-10 DIAGNOSIS — M5416 Radiculopathy, lumbar region: Secondary | ICD-10-CM | POA: Diagnosis not present

## 2020-09-20 ENCOUNTER — Ambulatory Visit (INDEPENDENT_AMBULATORY_CARE_PROVIDER_SITE_OTHER): Payer: Medicaid Other | Admitting: Internal Medicine

## 2020-09-20 ENCOUNTER — Encounter: Payer: Self-pay | Admitting: Internal Medicine

## 2020-09-20 ENCOUNTER — Other Ambulatory Visit: Payer: Self-pay | Admitting: *Deleted

## 2020-09-20 VITALS — BP 116/82 | HR 75 | Temp 97.7°F | Wt 193.3 lb

## 2020-09-20 DIAGNOSIS — I10 Essential (primary) hypertension: Secondary | ICD-10-CM | POA: Diagnosis not present

## 2020-09-20 DIAGNOSIS — R109 Unspecified abdominal pain: Secondary | ICD-10-CM

## 2020-09-20 DIAGNOSIS — M797 Fibromyalgia: Secondary | ICD-10-CM

## 2020-09-20 DIAGNOSIS — E559 Vitamin D deficiency, unspecified: Secondary | ICD-10-CM | POA: Diagnosis not present

## 2020-09-20 DIAGNOSIS — E1369 Other specified diabetes mellitus with other specified complication: Secondary | ICD-10-CM

## 2020-09-20 DIAGNOSIS — E785 Hyperlipidemia, unspecified: Secondary | ICD-10-CM

## 2020-09-20 DIAGNOSIS — I159 Secondary hypertension, unspecified: Secondary | ICD-10-CM

## 2020-09-20 DIAGNOSIS — Z0000 Encounter for general adult medical examination without abnormal findings: Secondary | ICD-10-CM | POA: Diagnosis not present

## 2020-09-20 DIAGNOSIS — Z23 Encounter for immunization: Secondary | ICD-10-CM

## 2020-09-20 LAB — POCT GLYCOSYLATED HEMOGLOBIN (HGB A1C): Hemoglobin A1C: 5.3 % (ref 4.0–5.6)

## 2020-09-20 LAB — GLUCOSE, CAPILLARY: Glucose-Capillary: 104 mg/dL — ABNORMAL HIGH (ref 70–99)

## 2020-09-20 MED ORDER — PANTOPRAZOLE SODIUM 40 MG PO TBEC: 80.0000 mg | DELAYED_RELEASE_TABLET | Freq: Two times a day (BID) | ORAL | 1 refills | Status: DC

## 2020-09-20 MED ORDER — SUCRALFATE 1 G PO TABS: 1.0000 g | ORAL_TABLET | Freq: Three times a day (TID) | ORAL | 1 refills | Status: DC

## 2020-09-20 MED ORDER — DULOXETINE HCL 30 MG PO CPEP: 30.0000 mg | ORAL_CAPSULE | Freq: Every day | ORAL | 0 refills | Status: DC

## 2020-09-20 MED ORDER — AMLODIPINE-OLMESARTAN 5-40 MG PO TABS: 1.0000 | ORAL_TABLET | Freq: Every day | ORAL | 1 refills | Status: DC

## 2020-09-20 NOTE — Telephone Encounter (Signed)
Fax from [facility] - " Need different doctor for Medicaid". I will send requests to the Attending.

## 2020-09-20 NOTE — Patient Instructions (Signed)
[name], it was a pleasure seeing you today!  Today we discussed: Abdominal pain: We will increase your antiacid medicine to two tablets twice daily. And add a medicine called Carafate to your regimen for stomach protection. Avoid all NSAIDs like ibuprofen, motrin and only use tylenol for over the counter pain relieve. With no more than 3000 mg of tylenol per day.  HTN: We will start a combination pill Azor and remove the Norvasc and Olmesartan. Fibromyalgia: We will start Duloxetine which will help with the pain and improve your mood as well.    I have ordered the following labs today:  Lab Orders         Vitamin D (25 hydroxy)         POC Hbg A1C        Referrals ordered today:   Referral Orders         Ambulatory referral to Gastroenterology      I have ordered the following medication/changed the following medications:   Stop the following medications: Medications Discontinued During This Encounter  Medication Reason   omeprazole (PRILOSEC) 40 MG capsule    amLODipine (NORVASC) 5 MG tablet    olmesartan (BENICAR) 40 MG tablet      Start the following medications: Meds ordered this encounter  Medications   DULoxetine (CYMBALTA) 30 MG capsule    Sig: Take 1 capsule (30 mg total) by mouth daily. Take 1 tablet daily for 4 weeks then take two tablets once daily after that.    Dispense:  90 capsule    Refill:  0   pantoprazole (PROTONIX) 40 MG tablet    Sig: Take 2 tablets (80 mg total) by mouth 2 (two) times daily. Take two tablets twice a day until you follow with GI.    Dispense:  120 tablet    Refill:  1   sucralfate (CARAFATE) 1 g tablet    Sig: Take 1 tablet (1 g total) by mouth 4 (four) times daily -  with meals and at bedtime. Take up to four times daily until you follow with GI.    Dispense:  120 tablet    Refill:  1   amLODipine-olmesartan (AZOR) 5-40 MG tablet    Sig: Take 1 tablet by mouth daily.    Dispense:  90 tablet    Refill:  1      Follow-up: 2-3 months   Please make sure to arrive 15 minutes prior to your next appointment. If you arrive late, you may be asked to reschedule.   We look forward to seeing you next time. Please call our clinic at [phone] if you have any questions or concerns. The best time to call is Monday-Friday from 9am-4pm, but there is someone available 24/7. If after hours or the weekend, call the main hospital number and ask for the Internal Medicine Resident On-Call. If you need medication refills, please notify your pharmacy one week in advance and they will send [place] a request.  Thank you for letting us take part in your care. Wishing you the best!  Thank you, Idamae Schuller, MD

## 2020-09-21 LAB — VITAMIN D 25 HYDROXY (VIT D DEFICIENCY, FRACTURES): Vit D, 25-Hydroxy: 28.9 ng/mL — ABNORMAL LOW (ref 30.0–100.0)

## 2020-09-23 DIAGNOSIS — E559 Vitamin D deficiency, unspecified: Secondary | ICD-10-CM | POA: Insufficient documentation

## 2020-09-23 NOTE — Assessment & Plan Note (Signed)
Assessment: Patient has a history of Vitamin D deficiency with current Vitamin D supplementation. Vitamin D level was checked that showed a level of 28.9. Her level is slightly below normal. She is taking 800 units daily.  Plan: -Continue Vitamin D supplement  -Will repeat level after one year.

## 2020-09-23 NOTE — Assessment & Plan Note (Signed)
Assessment: Patient has fibromyalgia and states she still has pain that she gets in shoulder and hips. She endorsed taking ibuprofen but stated dose was 500 mg tablets which is consistent with acetaminophen. Advised patient to discontinue NSAIDS due to GI side effects. Advised her she can take up to 3 g of acetaminophen daily. Advised on stress reduction.  Plan: -Start Duloxetine 30 mg daily with increase to 60 mg daily -Follow with neurology -Encouraged stress reducing activities

## 2020-09-23 NOTE — Assessment & Plan Note (Signed)
Flu shot given today

## 2020-09-23 NOTE — Assessment & Plan Note (Signed)
Assessment:  Patient stated she was having epigastric pain with some heart burn as well. She had a EGD last year that showed multiple ulcers. She endorsed taking ibuprofen but stated the dosage was 500 mg which is consistent with acetaminophen dosage. I advised her to avoid all NSAIDs due to GI side effects.  Plan: -GI referral as she has hx of ulcers -Increase Protonix 40 mg to twice daily until she sees GI -Add Carafate with meals for GI protection until GI follow up

## 2020-09-23 NOTE — Progress Notes (Signed)
CC: follow up  HPI:  [name] is a 60 y.o. with medical history as below presenting to [facility] for follow up.  Please see problem-based list for further details, assessments, and plans.  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 Alfonzo Beers and receives occasional injections. States she was told by Dr. Vertell Limber that she does not qualify for surgery    Constipation 07/22/2018   Tennis Must Quervain's disease (tenosynovitis) 01/17/2019   Fibromyalgia    Functional incontinence 10/07/2019   GERD (gastroesophageal reflux disease)    Heart murmur    yrs ago, heart murmur gone now   History of kidney stones    Hyperlipidemia    Hypertension    Jaundice    AGE 55 OR 21, food poisioning   Metatarsal deformity 02/01/2014   MVP (mitral valve prolapse)    Piriformis syndrome of right side 01/09/2017   Postmenopausal bleeding 12/31/2016   Pronation deformity of ankle, acquired 02/01/2014   Stable angina (HCC)    per pt no chest pain since 2017   Status post right foot surgery 04/12/2014   Superficial burn 07/08/2019   Urinary incontinence 10/13/2018   Wears glasses    Wears partial dentures    UPPER   Review of Systems: Review of system negative unless stated in the problem list or HPI.      Physical Exam:  Vitals:   09/20/20 0856  BP: 116/82  Pulse: 75  Temp: 97.7 F (36.5 C)  TempSrc: Oral  SpO2: 99%  Weight: 193 lb 4.8 oz (87.7 kg)    Physical Exam Constitutional:      General: She is not in acute distress.    Appearance: Normal appearance. She is not ill-appearing.  HENT:     Head: Normocephalic and atraumatic.     Right Ear: External ear normal.     Left Ear: External ear normal.     Nose: Nose normal.     Mouth/Throat:     Mouth: Mucous membranes are moist.     Pharynx: Oropharynx is clear.  Eyes:      Extraocular Movements: Extraocular movements intact.     Conjunctiva/sclera: Conjunctivae normal.     Pupils: Pupils are equal, round, and reactive to light.  Cardiovascular:     Rate and Rhythm: Normal rate and regular rhythm.     Pulses: Normal pulses.     Heart sounds: Normal heart sounds. No murmur heard.   No friction rub. No gallop.  Pulmonary:     Effort: Pulmonary effort is normal. No respiratory distress.     Breath sounds: Normal breath sounds. No stridor. No wheezing or rhonchi.  Abdominal:     General: Bowel sounds are normal. There is no distension.     Palpations: Abdomen is soft. There is no mass.  Musculoskeletal:        General: No swelling or tenderness. Normal range of motion.     Cervical back: Normal range of motion and neck supple.  Skin:    Capillary Refill: Capillary refill takes less than 2 seconds.     Coloration: Skin is not jaundiced.     Findings: No erythema, lesion or rash.  Neurological:     General: No focal deficit present.     Mental Status: She is alert and oriented  to person, place, and time. Mental status is at baseline.  Psychiatric:        Mood and Affect: Mood normal.        Behavior: Behavior normal.    Assessment & Plan:   See Encounters Tab for problem based charting.  Patient seen with Dr. Odella Aquas, MD

## 2020-09-23 NOTE — Assessment & Plan Note (Addendum)
Assessment: Patient has hyperlipidemia that is well controlled with lipitor 20 mg. Lipid profile shown below: Lipid Panel     Component Value Date/Time   CHOL 156 03/13/2020 0923   TRIG 83 03/13/2020 0923   HDL 56 03/13/2020 0923   CHOLHDL 2.8 03/13/2020 0923   LDLCALC 84 03/13/2020 0923   LABVLDL 16 03/13/2020 0923   Plan: -Continue lipitor 20 mg daily

## 2020-09-23 NOTE — Assessment & Plan Note (Signed)
Assessment: Patient's has hypertension that is controlled. Today's blood pressure was 110/82. Current medications include Olmesartan '40mg'$  and Norvasc 5 mg. Patient endorsed adherence. Patient denied any associated symptoms.  Plan: -We will stop your individual medicines and start a combo pill Azor 5-'40mg'$ .

## 2020-09-24 ENCOUNTER — Telehealth: Payer: Self-pay | Admitting: Gastroenterology

## 2020-09-24 ENCOUNTER — Encounter: Payer: Self-pay | Admitting: Internal Medicine

## 2020-09-24 DIAGNOSIS — M48061 Spinal stenosis, lumbar region without neurogenic claudication: Secondary | ICD-10-CM | POA: Diagnosis not present

## 2020-09-24 DIAGNOSIS — M4316 Spondylolisthesis, lumbar region: Secondary | ICD-10-CM | POA: Diagnosis not present

## 2020-09-24 DIAGNOSIS — Z6831 Body mass index (BMI) 31.0-31.9, adult: Secondary | ICD-10-CM | POA: Diagnosis not present

## 2020-09-24 DIAGNOSIS — M5416 Radiculopathy, lumbar region: Secondary | ICD-10-CM | POA: Diagnosis not present

## 2020-09-24 DIAGNOSIS — M545 Low back pain, unspecified: Secondary | ICD-10-CM | POA: Diagnosis not present

## 2020-09-24 DIAGNOSIS — I10 Essential (primary) hypertension: Secondary | ICD-10-CM | POA: Diagnosis not present

## 2020-09-24 NOTE — Telephone Encounter (Signed)
Pt states that she went to her PCP last Thursday and they started her on two medications and instructed her to follow up with her GI Dr. Abbott Pao stated that she just started the medications Appointment scheduled for Dr. Tarri Glenn for 10/22/2020 At 3:00. Pt made aware.

## 2020-09-25 ENCOUNTER — Other Ambulatory Visit: Payer: Self-pay

## 2020-09-25 DIAGNOSIS — R109 Unspecified abdominal pain: Secondary | ICD-10-CM

## 2020-09-25 DIAGNOSIS — M797 Fibromyalgia: Secondary | ICD-10-CM

## 2020-09-25 NOTE — Progress Notes (Signed)
Internal Medicine Clinic Attending  I saw and evaluated the patient.  I personally confirmed the key portions of the history and exam documented by Dr. Khan and I reviewed pertinent patient test results.  The assessment, diagnosis, and plan were formulated together and I agree with the documentation in the resident's note.  

## 2020-09-25 NOTE — Telephone Encounter (Signed)
Per patient the insurance will not covered this medication, DULoxetine (CYMBALTA) 30 MG capsule,and   pantoprazole (PROTONIX) 40 MG tablet. Please call pt back.

## 2020-09-26 ENCOUNTER — Other Ambulatory Visit: Payer: Self-pay | Admitting: Internal Medicine

## 2020-09-26 DIAGNOSIS — M797 Fibromyalgia: Secondary | ICD-10-CM

## 2020-09-27 MED ORDER — DULOXETINE HCL 30 MG PO CPEP: 30.0000 mg | ORAL_CAPSULE | Freq: Every day | ORAL | 0 refills | Status: DC

## 2020-09-27 MED ORDER — PANTOPRAZOLE SODIUM 40 MG PO TBEC: 80.0000 mg | DELAYED_RELEASE_TABLET | Freq: Two times a day (BID) | ORAL | 1 refills | Status: DC

## 2020-10-03 ENCOUNTER — Other Ambulatory Visit: Payer: Self-pay | Admitting: Neurosurgery

## 2020-10-03 DIAGNOSIS — M5416 Radiculopathy, lumbar region: Secondary | ICD-10-CM

## 2020-10-20 ENCOUNTER — Other Ambulatory Visit: Payer: Self-pay | Admitting: Internal Medicine

## 2020-10-20 DIAGNOSIS — M797 Fibromyalgia: Secondary | ICD-10-CM

## 2020-10-22 ENCOUNTER — Ambulatory Visit (INDEPENDENT_AMBULATORY_CARE_PROVIDER_SITE_OTHER): Payer: Medicaid Other | Admitting: Gastroenterology

## 2020-10-22 ENCOUNTER — Other Ambulatory Visit: Payer: Self-pay | Admitting: Internal Medicine

## 2020-10-22 ENCOUNTER — Encounter: Payer: Self-pay | Admitting: Gastroenterology

## 2020-10-22 VITALS — BP 122/72 | HR 62 | Ht 66.0 in | Wt 187.0 lb

## 2020-10-22 DIAGNOSIS — R1013 Epigastric pain: Secondary | ICD-10-CM | POA: Diagnosis not present

## 2020-10-22 DIAGNOSIS — R109 Unspecified abdominal pain: Secondary | ICD-10-CM

## 2020-10-22 NOTE — Patient Instructions (Addendum)
Continue omeprazole 40 mg twice daily.   Continue to avoid all NSAID's.  You have been scheduled for an endoscopy. Please follow written instructions given to you at your visit today. If you use inhalers (even only as needed), please bring them with you on the day of your procedure.  Due to recent changes in healthcare laws, you may see the results of your imaging and laboratory studies on MyChart before your provider has had a chance to review them.  We understand that in some cases there may be results that are confusing or concerning to you. Not all laboratory results come back in the same time frame and the provider may be waiting for multiple results in order to interpret others.  Please give [place] 48 hours in order for your provider to thoroughly review all the results before contacting the office for clarification of your results.   The [place] [facility] providers would like to encourage you to use [facility] to communicate with providers for non-urgent requests or questions.  Due to long hold times on the telephone, sending your provider a message by [facility] may be a faster and more efficient way to get a response.  Please allow 48 business hours for a response.  Please remember that this is for non-urgent requests.

## 2020-10-22 NOTE — Progress Notes (Signed)
Referring Provider: Idamae Schuller, MD Primary Care Physician:  Idamae Schuller, MD  Chief complaint:  Epigastric pain  IMPRESSION:  Recurrent upper abdominal pain with nausea while using NSAIDs Persistent ulcers on last EGD    - recently using ibuprofen for fibromyalgia H. pylori and NSAID associated gastric ulcers and gastritis on EGD 11/24/2018     - presenting with epigastric and chest pain  Family history of gastric cancer (mother) Family history of pancreatic cancer (brother) Sigmoid diverticulosis Hyperplastic polyp on screening colonoscopy 09/2017    - screening recommended 2029  Recurrent upper abdominal pain with nausea while using NSAIDs: Worrisome for persistent and/or recurrent PUD. There is a family history of gastric cancer in her mother. EGD recommended for further evaluation. Continue to avoid all NSAIDs.   Reviewed colonoscopy reports from 09/2017. No first degree family members with colon cancer. Next exam recommended in 10 years.    PLAN: - Continue omeprazole 40 mg BID  - Reviewed reflux lifestyle modifications - Continue to avoid all NSAIDs - EGD to evaluate ongoing symptoms - Colonoscopy 09/2027   Please see the "Patient Instructions" section for addition details about the plan.  HPI: LAKEESHA FONTANILLA is a 60 y.o. female last seen for epigastric pain 03/23/19. Returns now with recurrent symptoms with the inability to keep food down.   She was seen in consult 11/08/2018 and had an upper endoscopy 11/24/2018.  Follow-up EGD was performed 02/04/19. The interval history is obtained through the patient and review of her electronic health record.  She has fibromyalgia and was treating her hip and shoulder pain with ibuprofen. She recently started on Duloxetine to avoid NSAIDs.    EGD 11/24/2018 showed 2 nonbleeding gastric ulcers in the antrum with associated deformity.  The largest lesion was 10 mm the smaller lesion was 5 mm.  There was also diffuse gastritis.   Biopsies confirmed H. pylori gastritis.  H. pylori was treated with 10 days of amoxicillin 1 g twice daily, clarithromycin 500 mg twice daily, and omeprazole 40 mg twice daily.  Follow-up EGD 02/04/2019: 2 nonbleeding cratered gastric ulcers the largest measuring 3 mm located in the gastric antrum.  They had improved since the time of diagnosis in November.  The exam was otherwise normal.  Gastric biopsies were negative for H. pylori.  Returns today with recurrent symptoms. Primarily upper abdominal pain, persistent nausea, and regurgitation. Symptoms are not associated with eating, defecation, or movement. Symptoms persist despite compliance with the PCP recommendations to increase Protonix to 40 mg BID and added Carafate. No blood in the stool or change in bowel habits.    Endoscopic history: - Screening colonoscopy 10/08/2017: 1 mm hyperplastic polyp in the transverse colon and left-sided mild diverticulosis. - EGD 11/24/2018: 2 nonbleeding gastric ulcers in the antrum with associated deformity.  The largest lesion was 10 mm the smaller lesion was 5 mm.  There was also diffuse gastritis.  Biopsies confirmed H. pylori gastritis. - EGD 02/04/2019: 2 nonbleeding cratered gastric ulcers the largest measuring 3 mm located in the gastric antrum.  They had improved since the time of diagnosis in November. Gastric biopsies were negative for H. pylori.   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 Alfonzo Beers and receives occasional injections. States she was told by Dr. Vertell Limber that she does not  qualify for surgery    Constipation 07/22/2018   Tennis Must Quervain's disease (tenosynovitis) 01/17/2019   Fibromyalgia    Functional incontinence 10/07/2019   GERD (gastroesophageal reflux disease)    Heart murmur    yrs ago, heart murmur gone now    History of kidney stones    Hyperlipidemia    Hypertension    Jaundice    AGE 67 OR 21, food poisioning   Metatarsal deformity 02/01/2014   MVP (mitral valve prolapse)    Piriformis syndrome of right side 01/09/2017   Postmenopausal bleeding 12/31/2016   Pronation deformity of ankle, acquired 02/01/2014   Stable angina (HCC)    per pt no chest pain since 2017   Status post right foot surgery 04/12/2014   Superficial burn 07/08/2019   Urinary incontinence 10/13/2018   Wears glasses    Wears partial dentures    UPPER    Past Surgical History:  Procedure Laterality Date   CLOSED MANIPULATION KNEE WITH STERIOD INJECTION  2011   rt   COLONOSCOPY  AT AGE 79-normal exam   [place]   C[place]ton Osteotomy with Bone Graft Right 02/23/2014   Rt foot @ [facility]   DIAGNOSTIC LAPAROSCOPY     FOOT BONE EXCISION     both feet-spurs   FOOT SURGERY Bilateral    plantar fascitis   HYSTEROSCOPY WITH D & C N/A 12/18/2016   Procedure: DILATATION AND CURETTAGE /HYSTEROSCOPY;  Surgeon: Janyth Contes, MD;  Location: [place];  Service: Gynecology;  Laterality: N/A;   JOINT REPLACEMENT  2011   rt total knee replacement   KNEE ARTHROPLASTY  2012   rt   MINOR RELEASE DORSAL COMPARTMENT (DEQUERVAINS) Left 04/19/2019   Procedure: MINOR RELEASE DORSAL COMPARTMENT (DEQUERVAINS);  Surgeon: Renette Butters, MD;  Location: [facility];  Service: Orthopedics;  Laterality: Left;   OPEN REDUCTION INTERNAL FIXATION (ORIF) METACARPAL Left 07/06/2012   Procedure: LEFT HAND EXPLORATION WOUNDS, OPEN REDUCTION INTERNAL FIXATION SMALL METACARPAL FRACTURE;  Surgeon: Tennis Must, MD;  Location: [place];  Service: Orthopedics;  Laterality: Left;   REPAIR EXTENSOR TENDON Left 07/06/2012   Procedure: REPAIR EXTENSOR TENDON;  Surgeon: Tennis Must, MD;  Location: [place];  Service: Orthopedics;  Laterality: Left;  Left    SHOULDER ARTHROSCOPY WITH DISTAL  CLAVICLE RESECTION Left 01/24/2016   Procedure: SHOULDER ARTHROSCOPY WITH DISTAL CLAVICLE RESECTION;  Surgeon: [name], MD;  Location: [place];  Service: Orthopedics;  Laterality: Left;   SHOULDER ARTHROSCOPY WITH ROTATOR CUFF REPAIR AND SUBACROMIAL DECOMPRESSION Left 01/24/2016   Procedure: LEFT SHOULDER ARTHROSCOPY ACROMIOPLASTY, CLAVICLECTOMY, ARTHROSCOPIC ROTATOR CUFF REPAIR;  Surgeon: [name], MD;  Location: [place];  Service: Orthopedics;  Laterality: Left;   SHOULDER SURGERY  2010   rt    Current Outpatient Medications  Medication Sig Dispense Refill   acetaminophen (TYLENOL) 500 MG tablet Take 500 mg by mouth every 6 (six) hours as needed.     amLODipine-olmesartan (AZOR) 5-40 MG tablet Take 1 tablet by mouth daily. 90 tablet 1   atorvastatin (LIPITOR) 20 MG tablet TAKE 1 TABLET BY MOUTH EVERY DAY (Patient taking differently: Every other day) 90 tablet 1   chlorhexidine (PERIDEX) 0.12 % solution See admin instructions. Takes q day in pm     Cholecalciferol 20 MCG (800 UNIT) TABS Take 1 tablet by mouth daily. 90 tablet 3   diclofenac Sodium (VOLTAREN) 1 % GEL APPLY 2 GRAMS TO AFFECTED  AREA 4 TIMES A DAY (Patient taking differently: everyday) 300 g 3   diphenhydrAMINE (BENADRYL) 25 mg capsule Take 1 capsule (25 mg total) by mouth every 6 (six) hours as needed. 30 capsule 0   DULoxetine (CYMBALTA) 30 MG capsule Take 1 capsule (30 mg total) by mouth daily. Take 1 tablet daily for 4 weeks then take two tablets once daily after that. 90 capsule 0   methocarbamol (ROBAXIN) 500 MG tablet TAKE 1 TABLET (500 MG TOTAL) BY MOUTH 2 (TWO) TIMES DAILY AS NEEDED FOR MUSCLE SPASMS. 60 tablet 2   metoprolol succinate (TOPROL-XL) 100 MG 24 hr tablet TAKE 1 TABLET BY MOUTH EVERY MORNING WITH OR IMMEDIATELY FOLLOWING A MEAL (Patient taking differently: everyday) 90 tablet 1   pantoprazole (PROTONIX) 40 MG tablet Take 2 tablets (80 mg total) by mouth 2 (two)  times daily. Take two tablets twice a day until you follow with GI. 120 tablet 1   sucralfate (CARAFATE) 1 g tablet Take 1 tablet (1 g total) by mouth 4 (four) times daily -  with meals and at bedtime. Take up to four times daily until you follow with GI. 120 tablet 1   No current facility-administered medications for this visit.    Allergies as of 10/22/2020 - Review Complete 10/22/2020  Allergen Reaction Noted   Shrimp [shellfish allergy] Anaphylaxis, Shortness Of Breath, and Swelling 10/14/2011   Codeine Itching 10/14/2011   Shea butter  12/12/2016    Family History  Problem Relation Age of Onset   Diabetes Mother    Stroke Mother    Stomach cancer Mother    Esophageal cancer Mother    Cancer Mother    Cancer Father    Hypertension Father    Pancreatic cancer Brother    Breast cancer Cousin    Colon cancer Maternal Uncle    Colon polyps Neg Hx    Rectal cancer Neg Hx       Physical Exam: General:   Alert,  well-nourished, pleasant and cooperative in NAD Head:  Normocephalic and atraumatic. Eyes:  Sclera clear, no icterus.   Conjunctiva pink. Abdomen:  Soft, mild upper abdominal tenderness, nondistended, normal bowel sounds, no rebound or guarding. No hepatosplenomegaly.   Neurologic:  Alert and  oriented x4;  grossly nonfocal Skin: No rash or bruise.  Psych:  Alert and cooperative. Normal mood and affect.  Alaycia Eardley L. Tarri Glenn, MD, MPH 10/22/2020, 3:10 PM

## 2020-10-23 ENCOUNTER — Other Ambulatory Visit: Payer: Self-pay

## 2020-10-23 ENCOUNTER — Ambulatory Visit (AMBULATORY_SURGERY_CENTER): Payer: Medicaid Other | Admitting: Gastroenterology

## 2020-10-23 ENCOUNTER — Encounter: Payer: Self-pay | Admitting: Gastroenterology

## 2020-10-23 VITALS — BP 106/59 | HR 54 | Temp 97.3°F | Resp 15 | Ht 66.0 in | Wt 187.0 lb

## 2020-10-23 DIAGNOSIS — R1013 Epigastric pain: Secondary | ICD-10-CM

## 2020-10-23 DIAGNOSIS — I10 Essential (primary) hypertension: Secondary | ICD-10-CM | POA: Diagnosis not present

## 2020-10-23 DIAGNOSIS — K259 Gastric ulcer, unspecified as acute or chronic, without hemorrhage or perforation: Secondary | ICD-10-CM

## 2020-10-23 DIAGNOSIS — K2970 Gastritis, unspecified, without bleeding: Secondary | ICD-10-CM

## 2020-10-23 DIAGNOSIS — K2950 Unspecified chronic gastritis without bleeding: Secondary | ICD-10-CM | POA: Diagnosis not present

## 2020-10-23 DIAGNOSIS — K319 Disease of stomach and duodenum, unspecified: Secondary | ICD-10-CM | POA: Diagnosis not present

## 2020-10-23 DIAGNOSIS — K219 Gastro-esophageal reflux disease without esophagitis: Secondary | ICD-10-CM | POA: Diagnosis not present

## 2020-10-23 HISTORY — DX: Gastritis, unspecified, without bleeding: K29.70

## 2020-10-23 MED ORDER — SODIUM CHLORIDE 0.9 % IV SOLN
500.0000 mL | Freq: Once | INTRAVENOUS | Status: DC
Start: 2020-10-23 — End: 2020-10-23

## 2020-10-23 NOTE — Op Note (Addendum)
[place] Patient Name: Sandra Church Procedure Date: 10/23/2020 4:02 PM MRN: [id number] Endoscopist: Thornton Park MD, MD Age: 60 Referring MD:  Date of Birth: 28-Feb-1960 Gender: Female Account #: [id number] Procedure:                Upper GI endoscopy Indications:              Recurrent upper abdominal pain with nausea while                            using NSAIDs for fibromyalgia                           History of H. pylori and NSAID associated gastric                            ulcers and gastritis on EGD 11/24/2018                           - presenting with epigastric and chest pain                           Family history of gastric cancer (mother) Medicines:                Monitored Anesthesia Care Procedure:                Pre-Anesthesia Assessment:                           - Prior to the procedure, a History and Physical                            was performed, and patient medications and                            allergies were reviewed. The patient's tolerance of                            previous anesthesia was also reviewed. The risks                            and benefits of the procedure and the sedation                            options and risks were discussed with the patient.                            All questions were answered, and informed consent                            was obtained. Prior Anticoagulants: The patient has                            taken no previous anticoagulant or antiplatelet  agents. ASA Grade Assessment: II - A patient with                            mild systemic disease. After reviewing the risks                            and benefits, the patient was deemed in                            satisfactory condition to undergo the procedure.                           After obtaining informed consent, the endoscope was                            passed under direct vision. Throughout the                             procedure, the patient's blood pressure, pulse, and                            oxygen saturations were monitored continuously. The                            GIF [id number] #[id number] was introduced through the                            mouth, and advanced to the third part of duodenum.                            The upper GI endoscopy was accomplished without                            difficulty. The patient tolerated the procedure                            well. Scope In: Scope Out: Findings:                 The esophagus was normal.                           A deformity was found in the prepyloric region of                            the stomach in the area of prior ulcer disease.                           One non-bleeding cratered gastric ulcer with no                            stigmata of bleeding was found in the gastric  antrum. The lesion was 4 mm in largest dimension.                            Biopsies were taken from the ulcer margins and                            antrum with a cold forceps for histology. Estimated                            blood loss was minimal.                           The cardia and gastric fundus were normal on                            retroflexion.                           The examined duodenum was normal. Complications:            No immediate complications. Estimated blood loss:                            Minimal. Estimated Blood Loss:     Estimated blood loss was minimal. Impression:               - Normal esophagus.                           - Deformity in the prepyloric region of the stomach.                           - Non-bleeding gastric ulcer with no stigmata of                            bleeding. Biopsied.                           - Normal examined duodenum. Recommendation:           - Patient has a contact number available for                            emergencies. The signs and  symptoms of potential                            delayed complications were discussed with the                            patient. Return to normal activities tomorrow.                            Written discharge instructions were provided to the                            patient.                           -  Resume previous diet.                           - Continue present medications including omeprazole                            40 mg BID.                           - May add famotidine 20 mg BID if needed for                            additional symptomatic relief.                           - No aspirin, ibuprofen, naproxen, or other                            non-steroidal anti-inflammatory drugs. Talk to your                            doctor about other options for treatment of                            fibromyalgia other than NSAIDs.                           - Repeat EGD in 12 weeks to document healing.                           - Office follow-up prior to that time if needed. Thornton Park MD, MD 10/23/2020 4:33:14 PM This report has been signed electronically.

## 2020-10-23 NOTE — Progress Notes (Signed)
Referring Provider: Idamae Schuller, MD Primary Care Physician:  Idamae Schuller, MD  Indication for Procedure:  Epigastric pain  IMPRESSION:  Recurrent upper abdominal pain with nausea while using NSAIDs Persistent ulcers on last EGD    - recently using ibuprofen for fibromyalgia H. pylori and NSAID associated gastric ulcers and gastritis on EGD 11/24/2018     - presenting with epigastric and chest pain  Family history of gastric cancer (mother) Appropriate candidate for monitored anesthesia care in the [place]  PLAN: EGD     HPI: Sandra Church is a 60 y.o. female who presents for endoscopic evaluation of upper abdominal pain. Please see my outpatient office note from yesterday for full details. There has been no change in history or physical exam.   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 Alfonzo Beers and receives occasional injections. States she was told by Dr. Vertell Limber that she does not qualify for surgery    Constipation 07/22/2018   Tennis Must Quervain's disease (tenosynovitis) 01/17/2019   Fibromyalgia    Functional incontinence 10/07/2019   GERD (gastroesophageal reflux disease)    Heart murmur    yrs ago, heart murmur gone now   History of kidney stones    Hyperlipidemia    Hypertension    Jaundice    AGE 51 OR 21, food poisioning   Metatarsal deformity 02/01/2014   MVP (mitral valve prolapse)    Piriformis syndrome of right side 01/09/2017   Postmenopausal bleeding 12/31/2016   Pronation deformity of ankle, acquired 02/01/2014   Stable angina (HCC)    per pt no chest pain since 2017   Status post right foot surgery 04/12/2014   Superficial burn 07/08/2019   Urinary incontinence 10/13/2018   Wears glasses    Wears partial dentures    UPPER    Past Surgical History:  Procedure Laterality Date   CLOSED  MANIPULATION KNEE WITH STERIOD INJECTION  2011   rt   COLONOSCOPY  AT AGE 15-normal exam   [place]   [place] Osteotomy with Bone Graft Right 02/23/2014   Rt foot @ [facility]   DIAGNOSTIC LAPAROSCOPY     FOOT BONE EXCISION     both feet-spurs   FOOT SURGERY Bilateral    plantar fascitis   HYSTEROSCOPY WITH D & C N/A 12/18/2016   Procedure: DILATATION AND CURETTAGE /HYSTEROSCOPY;  Surgeon: Janyth Contes, MD;  Location: [place];  Service: Gynecology;  Laterality: N/A;   JOINT REPLACEMENT  2011   rt total knee replacement   KNEE ARTHROPLASTY  2012   rt   MINOR RELEASE DORSAL COMPARTMENT (DEQUERVAINS) Left 04/19/2019   Procedure: MINOR RELEASE DORSAL COMPARTMENT (DEQUERVAINS);  Surgeon: Renette Butters, MD;  Location: [facility];  Service: Orthopedics;  Laterality: Left;   OPEN REDUCTION INTERNAL FIXATION (ORIF) METACARPAL Left 07/06/2012   Procedure: LEFT HAND EXPLORATION WOUNDS, OPEN REDUCTION INTERNAL FIXATION SMALL METACARPAL FRACTURE;  Surgeon: Tennis Must, MD;  Location: [place];  Service: Orthopedics;  Laterality: Left;   REPAIR EXTENSOR TENDON Left 07/06/2012   Procedure: REPAIR EXTENSOR TENDON;  Surgeon: Tennis Must, MD;  Location: [place];  Service: Orthopedics;  Laterality: Left;  Left    SHOULDER ARTHROSCOPY WITH DISTAL CLAVICLE RESECTION Left 01/24/2016   Procedure: SHOULDER ARTHROSCOPY WITH DISTAL  CLAVICLE RESECTION;  Surgeon: [name], MD;  Location: [place];  Service: Orthopedics;  Laterality: Left;   SHOULDER ARTHROSCOPY WITH ROTATOR CUFF REPAIR AND SUBACROMIAL DECOMPRESSION Left 01/24/2016   Procedure: LEFT SHOULDER ARTHROSCOPY ACROMIOPLASTY, CLAVICLECTOMY, ARTHROSCOPIC ROTATOR CUFF REPAIR;  Surgeon: [name], MD;  Location: [place];  Service: Orthopedics;  Laterality: Left;   SHOULDER SURGERY  2010   rt     Allergies as of 10/23/2020 - Review Complete  10/23/2020  Allergen Reaction Noted   Shrimp [shellfish allergy] Anaphylaxis, Shortness Of Breath, and Swelling 10/14/2011   Codeine Itching 10/14/2011   Shea butter  12/12/2016    Family History  Problem Relation Age of Onset   Diabetes Mother    Stroke Mother    Stomach cancer Mother    Esophageal cancer Mother    Cancer Mother    Cancer Father    Hypertension Father    Pancreatic cancer Brother    Breast cancer Cousin    Colon cancer Maternal Uncle    Colon polyps Neg Hx    Rectal cancer Neg Hx       Physical Exam: General:   Alert,  well-nourished, pleasant and cooperative in NAD Head:  Normocephalic and atraumatic. Eyes:  Sclera clear, no icterus.   Conjunctiva pink. Abdomen:  Soft, mild upper abdominal tenderness, nondistended, normal bowel sounds, no rebound or guarding. No hepatosplenomegaly.   Neurologic:  Alert and  oriented x4;  grossly nonfocal Skin: No rash or bruise.  Psych:  Alert and cooperative. Normal mood and affect.  [name] L. Tarri Glenn, MD, MPH 10/23/2020, 3:23 PM

## 2020-10-23 NOTE — Patient Instructions (Signed)
YOU HAD AN ENDOSCOPIC PROCEDURE TODAY AT THE [facility]:   Refer to the procedure report that was given to you for any specific questions about what was found during the examination.  If the procedure report does not answer your questions, please call your gastroenterologist to clarify.  If you requested that your care partner not be given the details of your procedure findings, then the procedure report has been included in a sealed envelope for you to review at your convenience later.  YOU SHOULD EXPECT: Some feelings of bloating in the abdomen. Passage of more gas than usual.  Walking can help get rid of the air that was put into your GI tract during the procedure and reduce the bloating. If you had a lower endoscopy (such as a colonoscopy or flexible sigmoidoscopy) you may notice spotting of blood in your stool or on the toilet paper. If you underwent a bowel prep for your procedure, you may not have a normal bowel movement for a few days.  Please Note:  You might notice some irritation and congestion in your nose or some drainage.  This is from the oxygen used during your procedure.  There is no need for concern and it should clear up in a day or so.  SYMPTOMS TO REPORT IMMEDIATELY:    Following upper endoscopy (EGD)  Vomiting of blood or coffee ground material  New chest pain or pain under the shoulder blades  Painful or persistently difficult swallowing  New shortness of breath  Fever of 100F or higher  Black, tarry-looking stools  For urgent or emergent issues, a gastroenterologist can be reached at any hour by calling [phone]. Do not use MyChart messaging for urgent concerns.    DIET:  We do recommend a small meal at first, but then you may proceed to your regular diet.  Drink plenty of fluids but you should avoid alcoholic beverages for 24 hours.  ACTIVITY:  You should plan to take it easy for the rest of today and you should NOT DRIVE or use heavy machinery  until tomorrow (because of the sedation medicines used during the test).    FOLLOW UP: Our staff will call the number listed on your records 48-72 hours following your procedure to check on you and address any questions or concerns that you may have regarding the information given to you following your procedure. If we do not reach you, we will leave a message.  We will attempt to reach you two times.  During this call, we will ask if you have developed any symptoms of COVID 19. If you develop any symptoms (ie: fever, flu-like symptoms, shortness of breath, cough etc.) before then, please call [phone].  If you test positive for Covid 19 in the 2 weeks post procedure, please call and report this information to us.    If any biopsies were taken you will be contacted by phone or by letter within the next 1-3 weeks.  Please call us at [phone] if you have not heard about the biopsies in 3 weeks.    SIGNATURES/CONFIDENTIALITY: You and/or your care partner have signed paperwork which will be entered into your electronic medical record.  These signatures attest to the fact that that the information above on your After Visit Summary has been reviewed and is understood.  Full responsibility of the confidentiality of this discharge information lies with you and/or your care-partner. 

## 2020-10-23 NOTE — Progress Notes (Signed)
Data will not transfer from monitor, vs being posted manually.   

## 2020-10-23 NOTE — Progress Notes (Signed)
VS taken by DT 

## 2020-10-23 NOTE — Progress Notes (Signed)
Report given to PACU, vss 

## 2020-10-23 NOTE — Progress Notes (Signed)
1607 Robinul 0.1 mg IV given due large amount of secretions upon assessment.  MD made aware, vss

## 2020-10-25 ENCOUNTER — Telehealth: Payer: Self-pay | Admitting: *Deleted

## 2020-10-25 ENCOUNTER — Telehealth: Payer: Self-pay

## 2020-10-25 ENCOUNTER — Other Ambulatory Visit: Payer: Self-pay | Admitting: Student

## 2020-10-25 DIAGNOSIS — M797 Fibromyalgia: Secondary | ICD-10-CM

## 2020-10-25 NOTE — Telephone Encounter (Signed)
Patient called in stating she was started on duloxetine 30 mg daily at [place] on 9/8. Since starting this med she has been having "very dry mouth, no libido, and night sweats have increased." She is wanting to know if dose can be decreased to where she can tolerate it.

## 2020-10-25 NOTE — Telephone Encounter (Signed)
NO ANSWER, MESSAGE LEFT FOR PATIENT. 

## 2020-10-25 NOTE — Telephone Encounter (Signed)
No answer for post procedure followup call. Left Voicemail.

## 2020-10-26 MED ORDER — DULOXETINE HCL 20 MG PO CPEP: 20.0000 mg | ORAL_CAPSULE | Freq: Every day | ORAL | 0 refills | Status: DC

## 2020-10-26 NOTE — Telephone Encounter (Signed)
Returned call to patient. Discussed options below from Dr. Collene Gobble. She would like to try the 20 mg capsule. She will call back for appt if side effects continue at this dose. Please send Rx to [facility]. Thank you.

## 2020-10-26 NOTE — Telephone Encounter (Signed)
Requesting to speak with a nurse about DULoxetine (CYMBALTA) 30 MG capsule, please call pt back.

## 2020-10-26 NOTE — Progress Notes (Signed)
Received message from clinic RN patient has been experiencing "very dry mouth, no libido, and night sweats have increased" since starting duloxetine 30mg  daily. This medication was recently initiated by patient's PCP on 9/8 for fibromyalgia. RN discussed with [name] stopping medication vs decreasing dose. At this time, she would like to continue trying duloxetine but at a decreased dose. If patient continues to have side effects, will need to see in office and stop medication. Can consider other options for fibromyalgia, including gabapentin vs Lyrica vs SSRI.  - Decrease duloxetine 20mg  daily - Follow-up with clinic in 1-25mo  Sandra Whitehurst, MD Internal Medicine PGY-2 [phone]

## 2020-10-29 ENCOUNTER — Other Ambulatory Visit: Payer: Self-pay

## 2020-10-29 DIAGNOSIS — K2960 Other gastritis without bleeding: Secondary | ICD-10-CM

## 2020-10-29 DIAGNOSIS — R1013 Epigastric pain: Secondary | ICD-10-CM

## 2020-10-29 MED ORDER — OMEPRAZOLE 40 MG PO CPDR: 40.0000 mg | DELAYED_RELEASE_CAPSULE | Freq: Two times a day (BID) | ORAL | 3 refills | Status: DC

## 2020-10-29 MED ORDER — FAMOTIDINE 20 MG PO TABS: 20.0000 mg | ORAL_TABLET | Freq: Two times a day (BID) | ORAL | 3 refills | Status: DC

## 2020-11-06 ENCOUNTER — Ambulatory Visit: Payer: Medicaid Other | Admitting: Student

## 2020-11-06 DIAGNOSIS — M797 Fibromyalgia: Secondary | ICD-10-CM

## 2020-11-06 DIAGNOSIS — K2970 Gastritis, unspecified, without bleeding: Secondary | ICD-10-CM

## 2020-11-06 MED ORDER — AMITRIPTYLINE HCL 10 MG PO TABS: 10.0000 mg | ORAL_TABLET | Freq: Every day | ORAL | 0 refills | Status: DC

## 2020-11-06 NOTE — Progress Notes (Signed)
   CC: Follow-up  HPI:  Ms.Deona L Web is a 60 y.o. female with PMH as below who presents for follow-up on her fibromyalgia. Please see problem based charting for evaluation, assessment and plan.  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 Alfonzo Beers and receives occasional injections. States she was told by Dr. Vertell Limber that she does not qualify for surgery    Constipation 07/22/2018   Tennis Must Quervain's disease (tenosynovitis) 01/17/2019   Fibromyalgia    Functional incontinence 10/07/2019   GERD (gastroesophageal reflux disease)    Heart murmur    yrs ago, heart murmur gone now   History of kidney stones    Hyperlipidemia    Hypertension    Jaundice    AGE 69 OR 21, food poisioning   Metatarsal deformity 02/01/2014   MVP (mitral valve prolapse)    Piriformis syndrome of right side 01/09/2017   Postmenopausal bleeding 12/31/2016   Pronation deformity of ankle, acquired 02/01/2014   Stable angina (HCC)    per pt no chest pain since 2017   Status post right foot surgery 04/12/2014   Superficial burn 07/08/2019   Urinary incontinence 10/13/2018   Wears glasses    Wears partial dentures    UPPER    Review of Systems:  Constitutional: Negative for fever or fatigue Eyes: Negative for visual changes Respiratory: Negative for shortness of breath Cardiac: Negative for chest pain MSK: Positive for chronic pain Abdomen: Negative for abdominal pain, constipation or diarrhea Neuro: Negative for headache or weakness  Physical Exam: General: Pleasant, well-appearing elderly female. No acute distress. Cardiac: RRR. No murmurs, rubs or gallops. No LE edema Respiratory: Lungs CTAB. No wheezing or crackles. Abdominal: Soft, symmetric and non tender. Normal BS. Skin: Warm, dry and intact without rashes or lesions Extremities:  Atraumatic. Full ROM. Palpable radial and DP pulses. Neuro: A&O x 3. Moves all extremities. No focal deficit Psych: Appropriate mood and affect.  Vitals:   11/06/20 0915  BP: 135/78  Pulse: 77  Temp: 97.8 F (36.6 C)  TempSrc: Oral  SpO2: 100%  Weight: 187 lb 12.8 oz (85.2 kg)  Height: 5\' 6"  (1.676 m)    Assessment & Plan:   See Encounters Tab for problem based charting.  Patient discussed with Dr. Gardiner Sleeper, MD, MPH

## 2020-11-06 NOTE — Patient Instructions (Addendum)
Thank you, [name] for allowing [place] to provide your care today. Today we discussed your fibromyalgia, recent endoscopy and your medication. We are starting you on a new medication for your fibromyalgia. This medicine can make you drowsy so make sure to take it at night.   As instructed by your GI doctor, avoid NSAIDs such as ibuprofen or Advil.  You can take Tylenol extra strength up to 3 times a day as needed for pain.  I have ordered the following medication/changed the following medications:  Amitriptyline 10 mg once daily, 1 to 3 hours before bedtime   My Chart Access: [email or web address]  Please follow-up in 2-3 weeks  Please make sure to arrive 15 minutes prior to your next appointment. If you arrive late, you may be asked to reschedule.    We look forward to seeing you next time. Please call our clinic at [phone] if you have any questions or concerns. The best time to call is Monday-Friday from 9am-4pm, but there is someone available 24/7. If after hours or the weekend, call the main hospital number and ask for the Internal Medicine Resident On-Call. If you need medication refills, please notify your pharmacy one week in advance and they will send [place] a request.   Thank you for letting us take part in your care. Wishing you the best!  Lacinda Axon, MD 11/06/2020, 9:47 AM IM Resident, PGY-2 [facility] 41:10

## 2020-11-07 ENCOUNTER — Other Ambulatory Visit: Payer: Self-pay

## 2020-11-07 ENCOUNTER — Encounter: Payer: Self-pay | Admitting: Student

## 2020-11-07 ENCOUNTER — Ambulatory Visit
Admission: RE | Admit: 2020-11-07 | Discharge: 2020-11-07 | Disposition: A | Discharge status: Home / Self Care | Payer: Medicaid Other | Source: Ambulatory Visit | Attending: Neurosurgery | Admitting: Neurosurgery

## 2020-11-07 DIAGNOSIS — M5416 Radiculopathy, lumbar region: Secondary | ICD-10-CM

## 2020-11-07 DIAGNOSIS — M48061 Spinal stenosis, lumbar region without neurogenic claudication: Secondary | ICD-10-CM | POA: Diagnosis not present

## 2020-11-07 DIAGNOSIS — M545 Low back pain, unspecified: Secondary | ICD-10-CM | POA: Diagnosis not present

## 2020-11-07 NOTE — Assessment & Plan Note (Signed)
Patient was referred to GI for evaluation of epigastric pain and had a EGD on 10/11.  Endoscopy showed normal esophagus but non-bleeding gastric ulcer with no stigmata of bleeding.  Surgical pathology showed mild chronic gastritis but negative for H. pylori.  Patient advised to avoid NSAIDs and aspirins. -- Continue omeprazole 40 mg daily -- Continue famotidine 20 mg as needed for additional relief -- Follow-up EGD in 10 weeks.

## 2020-11-07 NOTE — Assessment & Plan Note (Addendum)
Patient here for follow-up on her fibromyalgia. Patient reports she was unable to tolerate duloxetine. She started having very dry mouth, no libido, and night sweats after starting duloxetine. She decreased the dose with no significant improvement in her symptoms. She is interested in trying another agent. She has tried gabapentin in the past which did not help. Patient reports that her mood has been stable and she has support from her family and church family. Patient was educated on the importance of exercise, good sleep hygiene and therapy to help manage her fibromyalgia.  Plan: -- Discontinue duloxetine -- Start amitriptyline 10 mg at nighttime. Patient was educated on the side effects of this medicine and the recommendation to make sure she takes it at night. --Continue Robaxin 500 mg twice a day as needed for muscle spasm -- 2 to 3 weeks follow-up for escalation of dose if patient is able to tolerate medication

## 2020-11-19 ENCOUNTER — Telehealth: Payer: Self-pay

## 2020-11-19 ENCOUNTER — Other Ambulatory Visit: Payer: Self-pay

## 2020-11-19 DIAGNOSIS — B9681 Helicobacter pylori [H. pylori] as the cause of diseases classified elsewhere: Secondary | ICD-10-CM

## 2020-11-19 NOTE — Telephone Encounter (Signed)
-----   Message from Aleatha Borer, LPN sent at 83/25/4982 10:19 AM EDT ----- Regarding: 12 wk EGD Call pt to schedule EGD in 12 weeks. Refer to path results

## 2020-11-19 NOTE — Telephone Encounter (Signed)
Per Dr. Tarri Glenn request, pt has been scheduled for repeat EGD to eval gastric ulcer healing on 01/17/21 @ [facility] @ 830am, arrive @ 730am. Amb referral placed for auth purposes. Prep instructions printed and mailed to pt home address per pt request.

## 2020-11-21 NOTE — Progress Notes (Signed)
Internal Medicine Clinic Attending  Case discussed with Dr. Amponsah  At the time of the visit.  We reviewed the resident's history and exam and pertinent patient test results.  I agree with the assessment, diagnosis, and plan of care documented in the resident's note.  

## 2020-12-16 ENCOUNTER — Other Ambulatory Visit: Payer: Self-pay | Admitting: Student

## 2020-12-18 DIAGNOSIS — Z01411 Encounter for gynecological examination (general) (routine) with abnormal findings: Secondary | ICD-10-CM | POA: Diagnosis not present

## 2020-12-18 DIAGNOSIS — Z01419 Encounter for gynecological examination (general) (routine) without abnormal findings: Secondary | ICD-10-CM | POA: Diagnosis not present

## 2020-12-18 DIAGNOSIS — N951 Menopausal and female climacteric states: Secondary | ICD-10-CM | POA: Diagnosis not present

## 2020-12-18 DIAGNOSIS — M545 Low back pain, unspecified: Secondary | ICD-10-CM | POA: Diagnosis not present

## 2020-12-18 DIAGNOSIS — Z1231 Encounter for screening mammogram for malignant neoplasm of breast: Secondary | ICD-10-CM | POA: Diagnosis not present

## 2020-12-18 DIAGNOSIS — M797 Fibromyalgia: Secondary | ICD-10-CM | POA: Diagnosis not present

## 2020-12-18 DIAGNOSIS — R413 Other amnesia: Secondary | ICD-10-CM | POA: Diagnosis not present

## 2020-12-18 DIAGNOSIS — N393 Stress incontinence (female) (male): Secondary | ICD-10-CM | POA: Diagnosis not present

## 2020-12-18 DIAGNOSIS — Z6830 Body mass index (BMI) 30.0-30.9, adult: Secondary | ICD-10-CM | POA: Diagnosis not present

## 2020-12-24 ENCOUNTER — Other Ambulatory Visit: Payer: Self-pay | Admitting: Internal Medicine

## 2020-12-24 DIAGNOSIS — I10 Essential (primary) hypertension: Secondary | ICD-10-CM

## 2020-12-24 MED ORDER — AMITRIPTYLINE HCL 10 MG PO TABS: 10.0000 mg | ORAL_TABLET | Freq: Every day | ORAL | 3 refills | Status: DC

## 2020-12-24 NOTE — Addendum Note (Signed)
Addended by: Idamae Schuller on: 12/24/2020 06:59 AM   Modules accepted: Orders

## 2021-01-17 ENCOUNTER — Other Ambulatory Visit: Payer: Self-pay

## 2021-01-17 ENCOUNTER — Ambulatory Visit (AMBULATORY_SURGERY_CENTER): Payer: Medicaid Other | Admitting: Gastroenterology

## 2021-01-17 ENCOUNTER — Encounter: Payer: Self-pay | Admitting: Gastroenterology

## 2021-01-17 VITALS — BP 117/69 | HR 57 | Temp 97.8°F | Resp 13 | Ht 66.0 in | Wt 187.0 lb

## 2021-01-17 DIAGNOSIS — Z800 Family history of malignant neoplasm of digestive organs: Secondary | ICD-10-CM | POA: Diagnosis not present

## 2021-01-17 DIAGNOSIS — R1013 Epigastric pain: Secondary | ICD-10-CM | POA: Diagnosis not present

## 2021-01-17 DIAGNOSIS — B9681 Helicobacter pylori [H. pylori] as the cause of diseases classified elsewhere: Secondary | ICD-10-CM | POA: Diagnosis not present

## 2021-01-17 DIAGNOSIS — K259 Gastric ulcer, unspecified as acute or chronic, without hemorrhage or perforation: Secondary | ICD-10-CM

## 2021-01-17 DIAGNOSIS — K2950 Unspecified chronic gastritis without bleeding: Secondary | ICD-10-CM | POA: Diagnosis not present

## 2021-01-17 DIAGNOSIS — K2960 Other gastritis without bleeding: Secondary | ICD-10-CM

## 2021-01-17 MED ORDER — SODIUM CHLORIDE 0.9 % IV SOLN: 500.0000 mL | INTRAVENOUS | Status: DC

## 2021-01-17 NOTE — Progress Notes (Signed)
Referring Provider: Idamae Schuller, MD Primary Care Physician:  Idamae Schuller, MD  Reason for Procedure:  Ulcer follow-up   IMPRESSION:  Persistent ulcers on last EGD    - recently using ibuprofen for fibromyalgia H. pylori and NSAID associated gastric ulcers     - presenting with epigastric and chest pain  Family history of gastric cancer (mother) Appropriate candidate for monitored anesthesia care in the [place]  PLAN: EGD in the [facility] today   HPI: Sandra Church is a 61 y.o. female presents for endoscopic follow-up of a gastric ulcer.,   Past Medical History:  Diagnosis Date   Anemia    Anxiety    Arthritis    Atrophic endometrium 12/18/2016   Atrophic endometrium 12/18/2016   Back pain with right-sided radiculopathy 03/11/2017   Follows with Dr. Alfonzo Beers and receives occasional injections. States she was told by Dr. Vertell Limber that she does not qualify for surgery    Constipation 07/22/2018   Tennis Must Quervain's disease (tenosynovitis) 01/17/2019   Fibromyalgia    Functional incontinence 10/07/2019   GERD (gastroesophageal reflux disease)    Heart murmur    yrs ago, heart murmur gone now   History of kidney stones    Hyperlipidemia    Hypertension    Jaundice    AGE 68 OR 21, food poisioning   Metatarsal deformity 02/01/2014   MVP (mitral valve prolapse)    Piriformis syndrome of right side 01/09/2017   Postmenopausal bleeding 12/31/2016   Pronation deformity of ankle, acquired 02/01/2014   Stable angina (HCC)    per pt no chest pain since 2017   Status post right foot surgery 04/12/2014   Superficial burn 07/08/2019   Urinary incontinence 10/13/2018   Wears glasses    Wears partial dentures    UPPER    Past Surgical History:  Procedure Laterality Date   CLOSED MANIPULATION KNEE WITH STERIOD INJECTION  2011   rt   COLONOSCOPY  AT AGE 39-normal exam   [place]   [place] Osteotomy with Bone Graft Right 02/23/2014   Rt foot @ [facility]   DIAGNOSTIC LAPAROSCOPY     FOOT BONE EXCISION      both feet-spurs   FOOT SURGERY Bilateral    plantar fascitis   HYSTEROSCOPY WITH D & C N/A 12/18/2016   Procedure: DILATATION AND CURETTAGE /HYSTEROSCOPY;  Surgeon: Janyth Contes, MD;  Location: [facility];  Service: Gynecology;  Laterality: N/A;   JOINT REPLACEMENT  2011   rt total knee replacement   KNEE ARTHROPLASTY  2012   rt   MINOR RELEASE DORSAL COMPARTMENT (DEQUERVAINS) Left 04/19/2019   Procedure: MINOR RELEASE DORSAL COMPARTMENT (DEQUERVAINS);  Surgeon: Renette Butters, MD;  Location: [facility];  Service: Orthopedics;  Laterality: Left;   OPEN REDUCTION INTERNAL FIXATION (ORIF) METACARPAL Left 07/06/2012   Procedure: LEFT HAND EXPLORATION WOUNDS, OPEN REDUCTION INTERNAL FIXATION SMALL METACARPAL FRACTURE;  Surgeon: Tennis Must, MD;  Location: [facility];  Service: Orthopedics;  Laterality: Left;   REPAIR EXTENSOR TENDON Left 07/06/2012   Procedure: REPAIR EXTENSOR TENDON;  Surgeon: Tennis Must, MD;  Location: [facility];  Service: Orthopedics;  Laterality: Left;  Left    SHOULDER ARTHROSCOPY WITH DISTAL CLAVICLE RESECTION Left 01/24/2016   Procedure: SHOULDER ARTHROSCOPY WITH DISTAL CLAVICLE RESECTION;  Surgeon: [name], MD;  Location: [facility];  Service: Orthopedics;  Laterality: Left;   SHOULDER ARTHROSCOPY WITH ROTATOR CUFF REPAIR AND SUBACROMIAL DECOMPRESSION Left 01/24/2016  Procedure: LEFT SHOULDER ARTHROSCOPY ACROMIOPLASTY, CLAVICLECTOMY, ARTHROSCOPIC ROTATOR CUFF REPAIR;  Surgeon: [name], MD;  Location: [facility];  Service: Orthopedics;  Laterality: Left;   SHOULDER SURGERY  2010   rt    Current Outpatient Medications  Medication Sig Dispense Refill   acetaminophen (TYLENOL) 500 MG tablet Take 500 mg by mouth every 6 (six) hours as needed.     amitriptyline (ELAVIL) 10 MG tablet Take 1 tablet (10 mg total) by mouth at bedtime. 90 tablet 3    amLODipine-olmesartan (AZOR) 5-40 MG tablet Take 1 tablet by mouth daily. 90 tablet 1   Cholecalciferol 20 MCG (800 UNIT) TABS Take 1 tablet by mouth daily. 90 tablet 3   diclofenac Sodium (VOLTAREN) 1 % GEL APPLY 2 GRAMS TO AFFECTED AREA 4 TIMES A DAY (Patient taking differently: everyday) 300 g 3   famotidine (PEPCID) 20 MG tablet Take 1 tablet (20 mg total) by mouth 2 (two) times daily. 60 tablet 3   methocarbamol (ROBAXIN) 500 MG tablet TAKE 1 TABLET (500 MG TOTAL) BY MOUTH 2 (TWO) TIMES DAILY AS NEEDED FOR MUSCLE SPASMS. 60 tablet 2   metoprolol succinate (TOPROL-XL) 100 MG 24 hr tablet TAKE 1 TABLET BY MOUTH EVERY MORNING WITH OR IMMEDIATELY FOLLOWING A MEAL 90 tablet 1   omeprazole (PRILOSEC) 40 MG capsule Take 1 capsule (40 mg total) by mouth 2 (two) times daily. 60 capsule 3   sucralfate (CARAFATE) 1 g tablet sucralfate 1 gram tablet  PLEASE SEE ATTACHED FOR DETAILED DIRECTIONS     atorvastatin (LIPITOR) 20 MG tablet TAKE 1 TABLET BY MOUTH EVERY DAY (Patient not taking: Reported on 10/23/2020) 90 tablet 1   chlorhexidine (PERIDEX) 0.12 % solution See admin instructions. Takes q day in pm (Patient not taking: Reported on 01/17/2021)     diphenhydrAMINE (BENADRYL) 25 mg capsule Take 1 capsule (25 mg total) by mouth every 6 (six) hours as needed. 30 capsule 0   Current Facility-Administered Medications  Medication Dose Route Frequency Provider Last Rate Last Admin   0.9 %  sodium chloride infusion  500 mL Intravenous Continuous Thornton Park, MD        Allergies as of 01/17/2021 - Review Complete 01/17/2021  Allergen Reaction Noted   Shrimp [shellfish allergy] Anaphylaxis, Shortness Of Breath, and Swelling 10/14/2011   Codeine Itching 10/14/2011   Shea butter  12/12/2016    Family History  Problem Relation Age of Onset   Diabetes Mother    Stroke Mother    Stomach cancer Mother    Esophageal cancer Mother    Cancer Mother    Cancer Father    Hypertension Father    Pancreatic  cancer Brother    Breast cancer Cousin    Colon cancer Maternal Uncle    Colon polyps Neg Hx    Rectal cancer Neg Hx      Physical Exam: General:   Alert,  well-nourished, pleasant and cooperative in NAD Head:  Normocephalic and atraumatic. Eyes:  Sclera clear, no icterus.   Conjunctiva pink. Mouth:  No deformity or lesions.   Neck:  Supple; no masses or thyromegaly. Lungs:  Clear throughout to auscultation.   No wheezes. Heart:  Regular rate and rhythm; no murmurs. Abdomen:  Soft, non-tender, nondistended, normal bowel sounds, no rebound or guarding.  Msk:  Symmetrical. No boney deformities LAD: No inguinal or umbilical LAD Extremities:  No clubbing or edema. Neurologic:  Alert and  oriented x4;  grossly nonfocal Skin:  No obvious rash  or bruise. Psych:  Alert and cooperative. Normal mood and affect.     Studies/Results: No results found.    Abeeha Twist L. Tarri Glenn, MD, MPH 01/17/2021, 8:52 AM

## 2021-01-17 NOTE — Progress Notes (Signed)
PT taken to PACU. Monitors in place. VSS. Report given to RN. 

## 2021-01-17 NOTE — Progress Notes (Signed)
Called to room to assist during endoscopic procedure.  Patient ID and intended procedure confirmed with present staff. Received instructions for my participation in the procedure from the performing physician.  

## 2021-01-17 NOTE — Progress Notes (Signed)
Pt's states no medical or surgical changes since previsit or office visit. 

## 2021-01-17 NOTE — Op Note (Signed)
[facility] Patient Name: Sandra Church Procedure Date: 01/17/2021 8:46 AM MRN: [id number] Endoscopist: Thornton Park MD, MD Age: 61 Referring MD:  Date of Birth: 04-23-1960 Gender: Female Account #: [id number] Procedure:                Upper GI endoscopy Indications:              Follow-up of chronic gastric ulcer, history of H                            pylori and NSAID associated gastric ulcers Medicines:                Monitored Anesthesia Care Procedure:                Pre-Anesthesia Assessment:                           - Prior to the procedure, a History and Physical                            was performed, and patient medications and                            allergies were reviewed. The patient's tolerance of                            previous anesthesia was also reviewed. The risks                            and benefits of the procedure and the sedation                            options and risks were discussed with the patient.                            All questions were answered, and informed consent                            was obtained. Prior Anticoagulants: The patient has                            taken no previous anticoagulant or antiplatelet                            agents. ASA Grade Assessment: II - A patient with                            mild systemic disease. After reviewing the risks                            and benefits, the patient was deemed in                            satisfactory condition to undergo the procedure.  After obtaining informed consent, the endoscope was                            passed under direct vision. Throughout the                            procedure, the patient's blood pressure, pulse, and                            oxygen saturations were monitored continuously. The                            Endoscope was introduced through the mouth, and                            advanced  to the third part of duodenum. The upper                            GI endoscopy was accomplished without difficulty.                            The patient tolerated the procedure well. Scope In: Scope Out: Findings:                 The esophagus was normal.                           A deformity was found in the gastric antrum. The                            overlying mucosa had a verrucous appearance -                            likely a result of healed gastric ulcer. Biopsies                            were taken with a cold forceps for histology.                            Estimated blood loss was minimal.                           The examined duodenum was normal. Complications:            No immediate complications. Estimated blood loss:                            Minimal. Estimated Blood Loss:     Estimated blood loss was minimal. Impression:               - Normal esophagus.                           - Deformity in the gastric antrum in the location  of prior gastric ulcer. Biopsied.                           - Normal examined duodenum. Recommendation:           - Patient has a contact number available for                            emergencies. The signs and symptoms of potential                            delayed complications were discussed with the                            patient. Return to normal activities tomorrow.                            Written discharge instructions were provided to the                            patient.                           - Resume previous diet.                           - Continue present medications including omeprazole                            40 mg BID.                           - No aspirin, ibuprofen, naproxen, or other                            non-steroidal anti-inflammatory drugs.                           - Await pathology results. Thornton Park MD, MD 01/17/2021 9:10:59 AM This report has been  signed electronically.

## 2021-01-17 NOTE — Patient Instructions (Signed)
The biopsies taken today have been sent for pathology.  The results can take 1-3 weeks to receive.      You may resume your previous diet and medication schedule.  Continue taking 40 mg of omeprazole (Prilosec) twice daily.    NO ASPIRIN, IBUPROFEN, NAPROXEN, OR OTHER NSAIDs!!!  Thank you for allowing [place] to care for you today!!!   YOU HAD AN ENDOSCOPIC PROCEDURE TODAY AT [facility]:   Refer to the procedure report that was given to you for any specific questions about what was found during the examination.  If the procedure report does not answer your questions, please call your gastroenterologist to clarify.  If you requested that your care partner not be given the details of your procedure findings, then the procedure report has been included in a sealed envelope for you to review at your convenience later.  YOU SHOULD EXPECT: Some feelings of bloating in the abdomen. Passage of more gas than usual.  Walking can help get rid of the air that was put into your GI tract during the procedure and reduce the bloating.   Please Note:  You might notice some irritation and congestion in your nose or some drainage.  This is from the oxygen used during your procedure.  There is no need for concern and it should clear up in a day or so.  SYMPTOMS TO REPORT IMMEDIATELY:  Following upper endoscopy (EGD)  Vomiting of blood or coffee ground material  New chest pain or pain under the shoulder blades  Painful or persistently difficult swallowing  New shortness of breath  Fever of 100F or higher  Black, tarry-looking stools  For urgent or emergent issues, a gastroenterologist can be reached at any hour by calling [phone]. Do not use MyChart messaging for urgent concerns.    DIET:  We do recommend a small meal at first, but then you may proceed to your regular diet.  Drink plenty of fluids but you should avoid alcoholic beverages for 24 hours.  ACTIVITY:  You should plan to  take it easy for the rest of today and you should NOT DRIVE or use heavy machinery until tomorrow (because of the sedation medicines used during the test).    FOLLOW UP: Our staff will call the number listed on your records Monday between 7:15 am and 8:15 am following your procedure to check on you and address any questions or concerns that you may have regarding the information given to you following your procedure. If we do not reach you, we will leave a message.  We will attempt to reach you two times.  During this call, we will ask if you have developed any symptoms of COVID 19. If you develop any symptoms (ie: fever, flu-like symptoms, shortness of breath, cough etc.) before then, please call [phone].  If you test positive for Covid 19 in the 2 weeks post procedure, please call and report this information to [place].    If any biopsies were taken you will be contacted by phone or by letter within the next 1-3 weeks.  Please call us at [phone] if you have not heard about the biopsies in 3 weeks.    SIGNATURES/CONFIDENTIALITY: You and/or your care partner have signed paperwork which will be entered into your electronic medical record.  These signatures attest to the fact that that the information above on your After Visit Summary has been reviewed and is understood.  Full responsibility of the confidentiality of this  discharge information lies with you and/or your care-partner.

## 2021-01-21 ENCOUNTER — Telehealth: Payer: Self-pay | Admitting: *Deleted

## 2021-01-21 NOTE — Telephone Encounter (Signed)
°  Follow up Call-  Call back number 01/17/2021 10/23/2020 02/04/2019 11/24/2018  Post procedure Call Back phone  # [phone]  Permission to leave phone message Yes Yes Yes Yes  Some recent data might be hidden     Patient questions:  Do you have a fever, pain , or abdominal swelling? No. Pain Score  0 *  Have you tolerated food without any problems? Yes.    Have you been able to return to your normal activities? Yes.    Do you have any questions about your discharge instructions: Diet   No. Medications  No. Follow up visit  No.  Do you have questions or concerns about your Care? No.  Actions: * If pain score is 4 or above: No action needed, pain <4.

## 2021-01-22 ENCOUNTER — Encounter: Payer: Self-pay | Admitting: Gastroenterology

## 2021-01-28 ENCOUNTER — Other Ambulatory Visit: Payer: Self-pay | Admitting: Gastroenterology

## 2021-01-28 DIAGNOSIS — R1013 Epigastric pain: Secondary | ICD-10-CM

## 2021-01-28 DIAGNOSIS — K2960 Other gastritis without bleeding: Secondary | ICD-10-CM

## 2021-02-10 ENCOUNTER — Other Ambulatory Visit: Payer: Self-pay | Admitting: Gastroenterology

## 2021-02-10 DIAGNOSIS — R1013 Epigastric pain: Secondary | ICD-10-CM

## 2021-02-10 DIAGNOSIS — K2960 Other gastritis without bleeding: Secondary | ICD-10-CM

## 2021-02-13 DIAGNOSIS — M4316 Spondylolisthesis, lumbar region: Secondary | ICD-10-CM | POA: Diagnosis not present

## 2021-02-13 DIAGNOSIS — Z6829 Body mass index (BMI) 29.0-29.9, adult: Secondary | ICD-10-CM | POA: Diagnosis not present

## 2021-02-13 DIAGNOSIS — I10 Essential (primary) hypertension: Secondary | ICD-10-CM | POA: Diagnosis not present

## 2021-03-04 ENCOUNTER — Telehealth: Payer: Self-pay | Admitting: Internal Medicine

## 2021-03-04 NOTE — Telephone Encounter (Signed)
..  Patient declines further follow up and engagement by the Managed Medicaid Team. Appropriate care team members and provider have been notified via electronic communication. The Managed Medicaid Team is available to follow up with the patient after provider conversation with the patient regarding recommendation for engagement and subsequent re-referral to the Managed Medicaid Team.    Jennifer Alley Care Guide, High Risk Medicaid Managed Care Embedded Care Coordination [place]  [facility]   

## 2021-03-05 ENCOUNTER — Other Ambulatory Visit: Payer: Self-pay | Admitting: Internal Medicine

## 2021-03-05 DIAGNOSIS — E7800 Pure hypercholesterolemia, unspecified: Secondary | ICD-10-CM

## 2021-03-06 ENCOUNTER — Other Ambulatory Visit: Payer: Self-pay | Admitting: Internal Medicine

## 2021-03-06 DIAGNOSIS — E7800 Pure hypercholesterolemia, unspecified: Secondary | ICD-10-CM

## 2021-03-06 MED ORDER — PRAVASTATIN SODIUM 40 MG PO TABS: 40.0000 mg | ORAL_TABLET | Freq: Every evening | ORAL | 3 refills | Status: DC

## 2021-03-06 NOTE — Telephone Encounter (Signed)
Call to patient.  Is not currently taking the Atorvastatin.  Problems with her stomach.  Stated was told by her GI doctor not to take.  Would like to take another medication for her Cholesterol if needed.

## 2021-03-18 ENCOUNTER — Encounter: Payer: Self-pay | Admitting: Gastroenterology

## 2021-04-12 ENCOUNTER — Encounter: Payer: Self-pay | Admitting: Internal Medicine

## 2021-04-13 ENCOUNTER — Other Ambulatory Visit: Payer: Self-pay | Admitting: Internal Medicine

## 2021-05-01 DIAGNOSIS — E785 Hyperlipidemia, unspecified: Secondary | ICD-10-CM | POA: Diagnosis not present

## 2021-05-01 DIAGNOSIS — M797 Fibromyalgia: Secondary | ICD-10-CM | POA: Diagnosis not present

## 2021-05-01 DIAGNOSIS — I10 Essential (primary) hypertension: Secondary | ICD-10-CM | POA: Diagnosis not present

## 2021-05-01 DIAGNOSIS — I208 Other forms of angina pectoris: Secondary | ICD-10-CM | POA: Diagnosis not present

## 2021-05-04 ENCOUNTER — Other Ambulatory Visit: Payer: Self-pay | Admitting: Internal Medicine

## 2021-05-04 DIAGNOSIS — I10 Essential (primary) hypertension: Secondary | ICD-10-CM

## 2021-06-04 ENCOUNTER — Other Ambulatory Visit: Payer: Self-pay | Admitting: Internal Medicine

## 2021-06-04 DIAGNOSIS — E7800 Pure hypercholesterolemia, unspecified: Secondary | ICD-10-CM

## 2021-06-14 DIAGNOSIS — R7303 Prediabetes: Secondary | ICD-10-CM | POA: Diagnosis not present

## 2021-06-14 DIAGNOSIS — E785 Hyperlipidemia, unspecified: Secondary | ICD-10-CM | POA: Diagnosis not present

## 2021-06-14 DIAGNOSIS — I10 Essential (primary) hypertension: Secondary | ICD-10-CM | POA: Diagnosis not present

## 2021-06-16 NOTE — Progress Notes (Unsigned)
CC: routine follow up  HPI:  [name] is a 61 y.o. with medical history as below presenting to [facility] for routine follow up.   Please see problem-based list for further details, assessments, and plans.  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 Alfonzo Beers and receives occasional injections. States she was told by Dr. Vertell Limber that she does not qualify for surgery    Constipation 07/22/2018   Tennis Must Quervain's disease (tenosynovitis) 01/17/2019   Fibromyalgia    Functional incontinence 10/07/2019   GERD (gastroesophageal reflux disease)    Heart murmur    yrs ago, heart murmur gone now   History of kidney stones    Hyperlipidemia    Hypertension    Jaundice    AGE 38 OR 21, food poisioning   Metatarsal deformity 02/01/2014   MVP (mitral valve prolapse)    Piriformis syndrome of right side 01/09/2017   Postmenopausal bleeding 12/31/2016   Pronation deformity of ankle, acquired 02/01/2014   Stable angina (HCC)    per pt no chest pain since 2017   Status post right foot surgery 04/12/2014   Superficial burn 07/08/2019   Urinary incontinence 10/13/2018   Wears glasses    Wears partial dentures    UPPER   Review of Systems:  Review of system negative unless stated in the problem list or HPI.    Physical Exam:  Vitals:   06/17/21 0909  BP: (!) 137/92  Pulse: 87  Resp: (!) 28  Temp: 98.3 F (36.8 C)  TempSrc: Oral  SpO2: 100%  Weight: 193 lb 3.2 oz (87.6 kg)  Height: '5\' 6"'$  (1.676 m)    Physical Exam General: NAD HENT: NCAT Lungs: CTAB, no wheeze, rhonchi or rales.  Cardiovascular: Normal heart sounds, no r/m/g, 2+ pulses in all extremities. No LE edema Abdomen: No TTP, normal bowel sounds MSK: No asymmetry or muscle atrophy.  Skin: no lesions noted on exposed skin Neuro: Alert and oriented x4. CN  grossly intact Psych: Normal mood and normal affect   Assessment & Plan:   See Encounters Tab for problem based charting.  Patient discussed with Dr. {NAMES:[id number]::"Butcher","Guilloud","Hoffman","Mullen","Narendra","Raines","Vincent"} Idamae Schuller, MD   Assessment/Plan BP at not at goal (< 130/80). BP in clinic today 137/92, HR 87. Reported home readings with SBP 130s-140, and Dia 70s-80s uses smart watch to monitor BP. Home medications include amlodipine-olmesartan 5-40 mg, toprol 100 mg qd. [place] cardiologist instructed her to stop taking metoprolol due to the arm and start atorvastatin. Reports good medication compliance. Tolerating medication without adverse effects. Denies headaches, vision changes, lightheadedness, chest pain, SHOB, leg swelling or changes in speech. Exam benign and vitals otherwise wnl. Last creatinine was 0.75 in 03/2020. Counseled on the importance of daily exercise, low salt diet, and weight loss. Change amlodipine-olmesartan 5-40 mg, will look at cards notes before starting toprol. HCTZ-valsartan-amlodipine Start  BP log and bring to next visit  Continue lifestyle changes BMP this visit  GERD/Gastric Ulcer Patient has GERD and is on prilosec 40 mg BID and pepcid 20 mg BID. He is adherent/non-adherent to his regimen. He has hx of ulcer which was shown by EGD in 11/2020. Repeat EGD in 01/2021 showed healed ulcer with biopsy showing chronic gastritis, H. Pylori negative and negative for metaplasia.  -Continue Protonix 40 mg BID, Pepcid 20  mg BID.  -Avoid NSAIDs.  Assessment/Plan Patient has HLD with goal of primary. Last lipid panel on 03/2020 showed Chol 156, LDL 84 . States was taking pravastatin 40 mg daily but was changed to lipitor 20 mg daily. Reports compliance to statin 20 mg daily without adverse effects. Counseled on the importance of continued lifestyle modifications, including: weight loss, daily exercise, and healthy diet with limited processed and fatty  foods.  Continue lipitor 20 mg qd Lipid panel ordered this visit Encouraged to continue lifestyle modifications   Fibromyalgia Patient started on amitriptyline 10 mg qhs after duloxetine failed patient's expectations. Gabapentin previously tried but did not help. Robaxin and tylenol for pain relieve.   Vitamin D deficiency Home med is Vitamin D 800 IU every other day. Vit D 28.9 in 09/2020. Will repeat Vit D level this visit.   Preventive Medicine Mammogram due

## 2021-06-17 ENCOUNTER — Ambulatory Visit (INDEPENDENT_AMBULATORY_CARE_PROVIDER_SITE_OTHER): Payer: Medicaid Other | Admitting: Internal Medicine

## 2021-06-17 ENCOUNTER — Encounter: Payer: Self-pay | Admitting: Internal Medicine

## 2021-06-17 ENCOUNTER — Other Ambulatory Visit: Payer: Self-pay

## 2021-06-17 DIAGNOSIS — E559 Vitamin D deficiency, unspecified: Secondary | ICD-10-CM | POA: Diagnosis not present

## 2021-06-17 DIAGNOSIS — I10 Essential (primary) hypertension: Secondary | ICD-10-CM

## 2021-06-17 DIAGNOSIS — E785 Hyperlipidemia, unspecified: Secondary | ICD-10-CM | POA: Diagnosis not present

## 2021-06-17 DIAGNOSIS — K219 Gastro-esophageal reflux disease without esophagitis: Secondary | ICD-10-CM

## 2021-06-17 DIAGNOSIS — M797 Fibromyalgia: Secondary | ICD-10-CM

## 2021-06-17 MED ORDER — METHOCARBAMOL 500 MG PO TABS: 500.0000 mg | ORAL_TABLET | Freq: Two times a day (BID) | ORAL | 2 refills | Status: DC | PRN

## 2021-06-17 MED ORDER — AMLODIPINE-VALSARTAN-HCTZ 5-160-12.5 MG PO TABS: 1.0000 | ORAL_TABLET | Freq: Every day | ORAL | 0 refills | Status: DC

## 2021-06-17 NOTE — Patient Instructions (Addendum)
[name], it was a pleasure seeing you today! You endorsed feeling well today. Below are some of the things we talked about this visit. We look forward to seeing you in the follow up appointment!  Today we discussed: High Blood Pressure You have high blood pressure that is uncontrolled. Today's blood pressure was 137/92. You don't check your blood pressure at home. Current medications include Azor 5-40 mg. You are taking your medications appropriately. You did not report any associated symptoms. We will change your medication to Amlodipine-Valsartan-HCTZ. Stop taking the Azor 5-40 mg daily. Continue checking your blood pressure at home and bring a log to the next visit. We will check labs at next visit. I will reschedule you for a follow up in 3 weeks.   For your cholesterol, please continue taking your lipitor. I will reach out to Dr. Zenia Resides office and see what his recommendations were, we will check your cholesterol in 3 weeks.   We will also check your vitamin D levels at next visit.   For your GERD, continue current medications and follow up GI as indicated.    I have ordered the following labs today:  Lab Orders  No laboratory test(s) ordered today      Referrals ordered today:   Referral Orders  No referral(s) requested today     I have ordered the following medication/changed the following medications:   Stop the following medications: Medications Discontinued During This Encounter  Medication Reason   amitriptyline (ELAVIL) 10 MG tablet Side effect (s)   sucralfate (CARAFATE) 1 g tablet Change in therapy   methocarbamol (ROBAXIN) 500 MG tablet Reorder     Start the following medications: Meds ordered this encounter  Medications   methocarbamol (ROBAXIN) 500 MG tablet    Sig: Take 1 tablet (500 mg total) by mouth 2 (two) times daily as needed for muscle spasms.    Dispense:  60 tablet    Refill:  2   amLODIPine-Valsartan-HCTZ 5-160-12.5 MG TABS     Sig: Take 1 tablet by mouth daily.    Dispense:  30 tablet    Refill:  0     Follow-up: 3 weeks  Please make sure to arrive 15 minutes prior to your next appointment. If you arrive late, you may be asked to reschedule.   We look forward to seeing you next time. Please call our clinic at [phone] if you have any questions or concerns. The best time to call is Monday-Friday from 9am-4pm, but there is someone available 24/7. If after hours or the weekend, call the main hospital number and ask for the Internal Medicine Resident On-Call. If you need medication refills, please notify your pharmacy one week in advance and they will send [place] a request.  Thank you for letting us take part in your care. Wishing you the best!  Thank you, Idamae Schuller, MD

## 2021-06-18 NOTE — Assessment & Plan Note (Signed)
Assessment/Plan Patient started on amitriptyline 10 mg qhs after duloxetine failed patient's expectations. Gabapentin and lyrica previously tried but did not help. Robaxin and tylenol work for pain relieve along with exercise. -Continue robaxin, tylenol and physical activity for symptomatic management.

## 2021-06-18 NOTE — Assessment & Plan Note (Signed)
Assessment/Plan Patient has GERD and is on Prilosec 40 mg BID and pepcid 20 mg BID by GI. She is adherent to his regimen. She has hx of ulcer which was shown by EGD in 11/2020. Repeat EGD in 01/2021 showed healed ulcer with biopsy showing chronic gastritis, H. Pylori negative and negative for metaplasia.  -Continue Protonix 40 mg BID, Pepcid 20 mg BID.  -Avoid NSAIDs -Continue to follow up with GI

## 2021-06-18 NOTE — Assessment & Plan Note (Addendum)
Assessment/Plan BP at not at goal (< 130/80). BP in clinic today 137/92, HR 87. Reported home readings with SBP 130s-140, and Dia 70s-80s uses smart watch to monitor BP. Home medications include amlodipine-olmesartan 5-40 mg, toprol 100 mg qd. [place] cardiologist instructed her to stop taking metoprolol due to the arm pain and start atorvastatin. Reports good medication compliance. Tolerating medication without adverse effects. Denies headaches, vision changes, lightheadedness, chest pain, SHOB, leg swelling or changes in speech. Exam benign and vitals otherwise wnl. Last creatinine was 0.75 in 03/2020. Counseled on the importance of daily exercise, low salt diet, and weight loss. -Change amlodipine-olmesartan 5-40 mg to HCTZ-valsartan-amlodipine will request cardiology note before restarting toprol.  -BP log and bring to next visit  -Continue lifestyle changes -BMP next visit in 3 weeks for BP follow up  Addendum: Notes reviewed from cardiology office and appears her metoprolol was kept the same and her statin was changed to lipitor 20 mg due to her complaint of myalgias.

## 2021-06-18 NOTE — Assessment & Plan Note (Signed)
Vitamin D deficiency Home med is Vitamin D 800 IU every other day. Vit D 28.9 in 09/2020. Will repeat Vit D level next visit with lab work.  -Continue Vit D supplement

## 2021-06-18 NOTE — Assessment & Plan Note (Addendum)
Assessment/Plan Patient has HLD with goal of primary. Last lipid panel on 03/2020 showed Chol 156, LDL 84. ASCVD risk calculated at 6.1 % with recommendation for moderate intensity statin. States was taking pravastatin 40 mg daily but was changed to lipitor 20 mg daily. Reports compliance to statin 20 mg daily without adverse effects. Counseled on the importance of continued lifestyle modifications, including: weight loss, daily exercise, and healthy diet with limited processed and fatty foods.  -Continue lipitor 20 mg qd, will request note from cardiology.  -Lipid panel next visit with lab work -Encouraged to continue lifestyle modifications

## 2021-06-20 NOTE — Addendum Note (Signed)
Addended by: Aldine Contes on: 06/20/2021 11:30 AM   Modules accepted: Level of Service

## 2021-06-20 NOTE — Progress Notes (Signed)
Internal Medicine Clinic Attending  Case discussed with Dr. Khan  At the time of the visit.  We reviewed the resident's history and exam and pertinent patient test results.  I agree with the assessment, diagnosis, and plan of care documented in the resident's note.  

## 2021-07-01 MED ORDER — ATORVASTATIN CALCIUM 20 MG PO TABS: 20.0000 mg | ORAL_TABLET | Freq: Every day | ORAL | 3 refills | Status: DC

## 2021-07-01 NOTE — Addendum Note (Signed)
Addended by: Idamae Schuller on: 07/01/2021 01:13 AM   Modules accepted: Orders

## 2021-07-07 NOTE — Progress Notes (Signed)
CC: HTN follow up and Labs  HPI:  [name] is a 61 y.o. with medical history as below presenting to [facility] for HTN follow up and labs.  Please see problem-based list for further details, assessments, and plans.  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 Alfonzo Beers and receives occasional injections. States she was told by Dr. Vertell Limber that she does not qualify for surgery    Constipation 07/22/2018   Tennis Must Quervain's disease (tenosynovitis) 01/17/2019   Fibromyalgia    Functional incontinence 10/07/2019   GERD (gastroesophageal reflux disease)    Heart murmur    yrs ago, heart murmur gone now   History of kidney stones    Hyperlipidemia    Hypertension    Jaundice    AGE 43 OR 21, food poisioning   Metatarsal deformity 02/01/2014   MVP (mitral valve prolapse)    Piriformis syndrome of right side 01/09/2017   Postmenopausal bleeding 12/31/2016   Pronation deformity of ankle, acquired 02/01/2014   Stable angina (HCC)    per pt no chest pain since 2017   Status post right foot surgery 04/12/2014   Superficial burn 07/08/2019   Urinary incontinence 10/13/2018   Wears glasses    Wears partial dentures    UPPER     Current Outpatient Medications (Cardiovascular):    amLODIPine-Valsartan-HCTZ 5-160-12.5 MG TABS, Take 1 tablet by mouth daily.   atorvastatin (LIPITOR) 20 MG tablet, Take 1 tablet (20 mg total) by mouth daily.   metoprolol succinate (TOPROL-XL) 100 MG 24 hr tablet, TAKE 1 TABLET BY MOUTH EVERY MORNING WITH OR IMMEDIATELY FOLLOWING A MEAL  Current Outpatient Medications (Respiratory):    diphenhydrAMINE (BENADRYL) 25 mg capsule, Take 1 capsule (25 mg total) by mouth every 6 (six) hours as needed.  Current Outpatient Medications (Analgesics):    acetaminophen (TYLENOL) 500 MG tablet, Take 500 mg  by mouth every 6 (six) hours as needed.   Current Outpatient Medications (Other):    chlorhexidine (PERIDEX) 0.12 % solution, See admin instructions. Takes q day in pm (Patient not taking: Reported on 01/17/2021)   Cholecalciferol 20 MCG (800 UNIT) TABS, Take 1 tablet by mouth daily.   diclofenac Sodium (VOLTAREN) 1 % GEL, APPLY 2 GRAMS TO AFFECTED AREA 4 TIMES A DAY   famotidine (PEPCID) 20 MG tablet, Take 1 tablet (20 mg total) by mouth 2 (two) times daily.   methocarbamol (ROBAXIN) 500 MG tablet, Take 1 tablet (500 mg total) by mouth 2 (two) times daily as needed for muscle spasms.   omeprazole (PRILOSEC) 40 MG capsule, Take 1 capsule (40 mg total) by mouth 2 (two) times daily.  Review of Systems:  Review of system negative unless stated in the problem list or HPI.    Physical Exam:  Vitals:   07/08/21 1014  BP: (!) 139/94  Pulse: 77  Temp: 98 F (36.7 C)  TempSrc: Oral  SpO2: 100%  Weight: 193 lb (87.5 kg)    Physical Exam General: NAD HENT: NCAT Lungs: CTAB, no wheeze, rhonchi or rales.  Cardiovascular: Normal heart sounds, no r/m/g, 2+ pulses in all extremities. No LE edema Abdomen: No TTP, normal bowel sounds MSK: No asymmetry or muscle atrophy.  Skin: no lesions noted on exposed skin Neuro: Alert and oriented x4. CN grossly intact Psych: Normal  mood and normal affect   Assessment & Plan:   See Encounters Tab for problem based charting.  Patient discussed with Dr. Oren Binet, MD

## 2021-07-08 ENCOUNTER — Other Ambulatory Visit: Payer: Self-pay

## 2021-07-08 ENCOUNTER — Ambulatory Visit: Payer: Medicaid Other | Admitting: Internal Medicine

## 2021-07-08 ENCOUNTER — Encounter: Payer: Self-pay | Admitting: Internal Medicine

## 2021-07-08 VITALS — BP 140/83 | HR 69 | Temp 98.0°F | Wt 193.0 lb

## 2021-07-08 DIAGNOSIS — E7800 Pure hypercholesterolemia, unspecified: Secondary | ICD-10-CM | POA: Diagnosis not present

## 2021-07-08 DIAGNOSIS — D649 Anemia, unspecified: Secondary | ICD-10-CM | POA: Diagnosis not present

## 2021-07-08 DIAGNOSIS — E559 Vitamin D deficiency, unspecified: Secondary | ICD-10-CM

## 2021-07-08 DIAGNOSIS — M797 Fibromyalgia: Secondary | ICD-10-CM | POA: Diagnosis not present

## 2021-07-08 DIAGNOSIS — I10 Essential (primary) hypertension: Secondary | ICD-10-CM

## 2021-07-08 DIAGNOSIS — E785 Hyperlipidemia, unspecified: Secondary | ICD-10-CM | POA: Diagnosis not present

## 2021-07-08 MED ORDER — DULOXETINE HCL 30 MG PO CPEP: ORAL_CAPSULE | ORAL | 1 refills | Status: DC

## 2021-07-09 LAB — CBC WITH DIFFERENTIAL/PLATELET
Basophils Absolute: 0.1 10*3/uL (ref 0.0–0.2)
Basos: 1 %
EOS (ABSOLUTE): 0.3 10*3/uL (ref 0.0–0.4)
Eos: 3 %
Hematocrit: 40.5 % (ref 34.0–46.6)
Hemoglobin: 13.4 g/dL (ref 11.1–15.9)
Immature Grans (Abs): 0 10*3/uL (ref 0.0–0.1)
Immature Granulocytes: 1 %
Lymphocytes Absolute: 3.1 10*3/uL (ref 0.7–3.1)
Lymphs: 35 %
MCH: 32.1 pg (ref 26.6–33.0)
MCHC: 33.1 g/dL (ref 31.5–35.7)
MCV: 97 fL (ref 79–97)
Monocytes Absolute: 0.4 10*3/uL (ref 0.1–0.9)
Monocytes: 4 %
Neutrophils Absolute: 4.9 10*3/uL (ref 1.4–7.0)
Neutrophils: 56 %
Platelets: 385 10*3/uL (ref 150–450)
RBC: 4.17 x10E6/uL (ref 3.77–5.28)
RDW: 13.1 % (ref 11.7–15.4)
WBC: 8.7 10*3/uL (ref 3.4–10.8)

## 2021-07-09 LAB — BMP8+ANION GAP
Anion Gap: 16 mmol/L (ref 10.0–18.0)
BUN/Creatinine Ratio: 15 (ref 12–28)
BUN: 14 mg/dL (ref 8–27)
CO2: 23 mmol/L (ref 20–29)
Calcium: 9.8 mg/dL (ref 8.7–10.3)
Chloride: 101 mmol/L (ref 96–106)
Creatinine, Ser: 0.93 mg/dL (ref 0.57–1.00)
Glucose: 98 mg/dL (ref 70–99)
Potassium: 4.1 mmol/L (ref 3.5–5.2)
Sodium: 140 mmol/L (ref 134–144)
eGFR: 69 mL/min/{1.73_m2} (ref >59)

## 2021-07-09 LAB — LIPID PANEL
Chol/HDL Ratio: 2.6 ratio (ref 0.0–4.4)
Cholesterol, Total: 148 mg/dL (ref 100–199)
HDL: 56 mg/dL (ref >39)
LDL Chol Calc (NIH): 77 mg/dL (ref 0–99)
Triglycerides: 77 mg/dL (ref 0–149)
VLDL Cholesterol Cal: 15 mg/dL (ref 5–40)

## 2021-07-09 LAB — VITAMIN D 25 HYDROXY (VIT D DEFICIENCY, FRACTURES): Vit D, 25-Hydroxy: 24.8 ng/mL — ABNORMAL LOW (ref 30.0–100.0)

## 2021-07-10 ENCOUNTER — Other Ambulatory Visit: Payer: Self-pay | Admitting: Internal Medicine

## 2021-07-10 NOTE — Assessment & Plan Note (Addendum)
Lipid panel performed this visit and shows Chol 148, and LDL 77. We will continue lipitor 20 mg daily. -Continue lipitor 20 mg daily -Repeat lipid panel in 6 months

## 2021-07-10 NOTE — Assessment & Plan Note (Signed)
Patient has vitamin D deficiency and is taking 800 IU of vitamin D per day. Her level was check this visit and decreased from 28.9 to 24.8. We will increase her supplementation to 2 tablets of 800 IU per day and recheck in 3-6 months.  -Increase Vitamin D supplementation to 1600 IU per day.

## 2021-07-10 NOTE — Assessment & Plan Note (Signed)
BP not at goal]. BP in clinic today 139/94, HR 77. Reported home readings with SBP 110-130, and Dia 50-60s. Home medications include amlodipine-valsartan-HCTZ 5-160-12.5 mg. Reports good medication compliance. Tolerating medication without adverse effects. Denies headaches, vision changes, lightheadedness, chest pain, SHOB, leg swelling or changes in speech. Last creatinine was 0.75 in 03/2020. Counseled on the importance of daily exercise, low salt diet, and weight loss. -Continue current meds, we will keep metoprolol discontinued. -BP log and bring to next visit  -Continue lifestyle changes -F/u on BMP today. BMP shows normal renal fxn.

## 2021-07-11 NOTE — Progress Notes (Signed)
Internal Medicine Clinic Attending  Case discussed with Dr. Khan  At the time of the visit.  We reviewed the resident's history and exam and pertinent patient test results.  I agree with the assessment, diagnosis, and plan of care documented in the resident's note.  

## 2021-07-30 ENCOUNTER — Other Ambulatory Visit: Payer: Self-pay | Admitting: Gastroenterology

## 2021-07-30 DIAGNOSIS — R1013 Epigastric pain: Secondary | ICD-10-CM

## 2021-07-30 DIAGNOSIS — K2960 Other gastritis without bleeding: Secondary | ICD-10-CM

## 2021-08-02 DIAGNOSIS — K219 Gastro-esophageal reflux disease without esophagitis: Secondary | ICD-10-CM | POA: Diagnosis not present

## 2021-08-02 DIAGNOSIS — E785 Hyperlipidemia, unspecified: Secondary | ICD-10-CM | POA: Diagnosis not present

## 2021-08-02 DIAGNOSIS — I208 Other forms of angina pectoris: Secondary | ICD-10-CM | POA: Diagnosis not present

## 2021-08-02 DIAGNOSIS — I10 Essential (primary) hypertension: Secondary | ICD-10-CM | POA: Diagnosis not present

## 2021-08-18 ENCOUNTER — Other Ambulatory Visit: Payer: Self-pay | Admitting: Internal Medicine

## 2021-08-18 DIAGNOSIS — M797 Fibromyalgia: Secondary | ICD-10-CM

## 2021-08-19 NOTE — Telephone Encounter (Signed)
Next appt scheduled 9/5 with PCP.

## 2021-09-17 ENCOUNTER — Ambulatory Visit (INDEPENDENT_AMBULATORY_CARE_PROVIDER_SITE_OTHER): Payer: Medicaid Other | Admitting: Internal Medicine

## 2021-09-17 ENCOUNTER — Ambulatory Visit (INDEPENDENT_AMBULATORY_CARE_PROVIDER_SITE_OTHER): Payer: Medicaid Other

## 2021-09-17 ENCOUNTER — Other Ambulatory Visit: Payer: Self-pay | Admitting: Internal Medicine

## 2021-09-17 ENCOUNTER — Encounter: Payer: Self-pay | Admitting: Internal Medicine

## 2021-09-17 VITALS — BP 148/88 | HR 101 | Ht 66.0 in | Wt 184.8 lb

## 2021-09-17 DIAGNOSIS — M797 Fibromyalgia: Secondary | ICD-10-CM | POA: Diagnosis not present

## 2021-09-17 DIAGNOSIS — K219 Gastro-esophageal reflux disease without esophagitis: Secondary | ICD-10-CM | POA: Diagnosis not present

## 2021-09-17 DIAGNOSIS — Z0000 Encounter for general adult medical examination without abnormal findings: Secondary | ICD-10-CM | POA: Diagnosis not present

## 2021-09-17 DIAGNOSIS — I10 Essential (primary) hypertension: Secondary | ICD-10-CM | POA: Diagnosis present

## 2021-09-17 MED ORDER — DULOXETINE HCL 30 MG PO CPEP: 30.0000 mg | ORAL_CAPSULE | Freq: Every day | ORAL | 1 refills | Status: DC

## 2021-09-17 NOTE — Assessment & Plan Note (Signed)
Patient tried cymbalta and stated it made her drowsy and difficult to function during the day. She stated over the few days she tried it did help her with her pain but she could not tolerate the side effects of it. Advised patient to take the medication at night especially if it is benefiting the patient.   -Re-start duloxetine 30 mg at night. Will titrate to 60 mg if patient is able to tolerate this dose.

## 2021-09-17 NOTE — Assessment & Plan Note (Signed)
Patient has GERD with hx of gastric ulcers. She is on PPI 40 mg BID and Pepcid BID prn. She states in the last month she had 3-4 flares of her GERD. Advised patient to gradually decrease Prilosec to once daily to avoid long term negative side effects of this medication. Can use pepcid prn as needed.  -Decrease Prilosec to once daily, use pepcid prn -follow up in 3 months

## 2021-09-17 NOTE — Progress Notes (Signed)
Subjective:   Sandra Church is a 61 y.o. female who presents for an Initial Medicare Annual Wellness Visit. I connected with  Sandra Church on 09/17/21 by a audio enabled telemedicine application and verified that I am speaking with the correct person using two identifiers.  Patient Location: Other:  Office/Clinic  Provider Location: Office/Clinic  I discussed the limitations of evaluation and management by telemedicine. The patient expressed understanding and agreed to proceed.  Review of Systems    Defer to PCP       Objective:    Today's Vitals   09/17/21 0922 09/17/21 0924  BP: (!) 148/88   Pulse: (!) 101   SpO2: 99%   Weight: 184 lb 12.8 oz (83.8 kg)   Height: '5\' 6"'$  (1.676 m)   PainSc:  8    Body mass index is 29.83 kg/m.     09/17/2021    9:27 AM 09/17/2021    9:21 AM 06/17/2021    9:15 AM 09/20/2020    8:57 AM 03/13/2020    8:58 AM 10/07/2019   10:39 AM 07/06/2019    5:01 PM  Advanced Directives  Does Patient Have a Medical Advance Directive? No No No No No No No  Would patient like information on creating a medical advance directive? No - Patient declined No - Patient declined No - Patient declined No - Patient declined No - Patient declined No - Patient declined No - Patient declined    Current Medications (verified) Outpatient Encounter Medications as of 09/17/2021  Medication Sig   acetaminophen (TYLENOL) 500 MG tablet Take 500 mg by mouth every 6 (six) hours as needed.   amLODIPine-Valsartan-HCTZ 5-160-12.5 MG TABS Take 1 tablet by mouth daily.   atorvastatin (LIPITOR) 20 MG tablet Take 1 tablet (20 mg total) by mouth daily.   chlorhexidine (PERIDEX) 0.12 % solution See admin instructions. Takes q day in pm (Patient not taking: Reported on 01/17/2021)   Cholecalciferol 20 MCG (800 UNIT) TABS Take 1 tablet by mouth daily.   diclofenac Sodium (VOLTAREN) 1 % GEL APPLY 2 GRAMS TO AFFECTED AREA 4 TIMES A DAY   diphenhydrAMINE (BENADRYL) 25 mg capsule Take 1  capsule (25 mg total) by mouth every 6 (six) hours as needed.   DULoxetine (CYMBALTA) 30 MG capsule Take 2 capsules (60 mg total) by mouth daily.   famotidine (PEPCID) 20 MG tablet Take 1 tablet (20 mg total) by mouth 2 (two) times daily.   methocarbamol (ROBAXIN) 500 MG tablet Take 1 tablet (500 mg total) by mouth 2 (two) times daily as needed for muscle spasms.   omeprazole (PRILOSEC) 40 MG capsule TAKE 1 CAPSULE BY MOUTH TWICE A DAY   No facility-administered encounter medications on file as of 09/17/2021.    Allergies (verified) Shrimp [shellfish allergy], Codeine, and Shea butter   History: Past Medical History:  Diagnosis Date   Anemia    Anxiety    Arthritis    Atrophic endometrium 12/18/2016   Atrophic endometrium 12/18/2016   Back pain with right-sided radiculopathy 03/11/2017   Follows with Dr. Alfonzo Beers and receives occasional injections. States she was told by Dr. Vertell Limber that she does not qualify for surgery    Constipation 07/22/2018   Tennis Must Quervain's disease (tenosynovitis) 01/17/2019   Fibromyalgia    Functional incontinence 10/07/2019   GERD (gastroesophageal reflux disease)    Heart murmur    yrs ago, heart murmur gone now   History of kidney stones    Hyperlipidemia  Hypertension    Jaundice    AGE 17 OR 21, food poisioning   Metatarsal deformity 02/01/2014   MVP (mitral valve prolapse)    Piriformis syndrome of right side 01/09/2017   Postmenopausal bleeding 12/31/2016   Pronation deformity of ankle, acquired 02/01/2014   Stable angina (HCC)    per pt no chest pain since 2017   Status post right foot surgery 04/12/2014   Superficial burn 07/08/2019   Urinary incontinence 10/13/2018   Wears glasses    Wears partial dentures    UPPER   Past Surgical History:  Procedure Laterality Date   CLOSED MANIPULATION KNEE WITH STERIOD INJECTION  2011   rt   COLONOSCOPY  AT AGE 1-normal exam   [place]   [place] Osteotomy with Bone Graft Right 02/23/2014   Rt foot @ [facility]    DIAGNOSTIC LAPAROSCOPY     FOOT BONE EXCISION     both feet-spurs   FOOT SURGERY Bilateral    plantar fascitis   HYSTEROSCOPY WITH D & C N/A 12/18/2016   Procedure: DILATATION AND CURETTAGE /HYSTEROSCOPY;  Surgeon: Janyth Contes, MD;  Location: [place];  Service: Gynecology;  Laterality: N/A;   JOINT REPLACEMENT  2011   rt total knee replacement   KNEE ARTHROPLASTY  2012   rt   MINOR RELEASE DORSAL COMPARTMENT (DEQUERVAINS) Left 04/19/2019   Procedure: MINOR RELEASE DORSAL COMPARTMENT (DEQUERVAINS);  Surgeon: Renette Butters, MD;  Location: [facility];  Service: Orthopedics;  Laterality: Left;   OPEN REDUCTION INTERNAL FIXATION (ORIF) METACARPAL Left 07/06/2012   Procedure: LEFT HAND EXPLORATION WOUNDS, OPEN REDUCTION INTERNAL FIXATION SMALL METACARPAL FRACTURE;  Surgeon: Tennis Must, MD;  Location: [place];  Service: Orthopedics;  Laterality: Left;   REPAIR EXTENSOR TENDON Left 07/06/2012   Procedure: REPAIR EXTENSOR TENDON;  Surgeon: Tennis Must, MD;  Location: [place];  Service: Orthopedics;  Laterality: Left;  Left    SHOULDER ARTHROSCOPY WITH DISTAL CLAVICLE RESECTION Left 01/24/2016   Procedure: SHOULDER ARTHROSCOPY WITH DISTAL CLAVICLE RESECTION;  Surgeon: [name], MD;  Location: [place];  Service: Orthopedics;  Laterality: Left;   SHOULDER ARTHROSCOPY WITH ROTATOR CUFF REPAIR AND SUBACROMIAL DECOMPRESSION Left 01/24/2016   Procedure: LEFT SHOULDER ARTHROSCOPY ACROMIOPLASTY, CLAVICLECTOMY, ARTHROSCOPIC ROTATOR CUFF REPAIR;  Surgeon: [name], MD;  Location: [place];  Service: Orthopedics;  Laterality: Left;   SHOULDER SURGERY  2010   rt   Family History  Problem Relation Age of Onset   Diabetes Mother    Stroke Mother    Stomach cancer Mother    Esophageal cancer Mother    Cancer Mother    Cancer Father    Hypertension Father    Pancreatic cancer  Brother    Breast cancer Cousin    Colon cancer Maternal Uncle    Colon polyps Neg Hx    Rectal cancer Neg Hx    Social History   Socioeconomic History   Marital status: Single    Spouse name: Not on file   Number of children: Not on file   Years of education: Not on file   Highest education level: Not on file  Occupational History   Not on file  Tobacco Use   Smoking status: Never   Smokeless tobacco: Never  Vaping Use   Vaping Use: Never used  Substance and Sexual Activity   Alcohol use: No    Alcohol/week: 0.0 standard drinks of alcohol  Drug use: No   Sexual activity: Not Currently    Birth control/protection: Post-menopausal  Other Topics Concern   Not on file  Social History Narrative   Not on file   Social Determinants of Health   Financial Resource Strain: Low Risk  (09/17/2021)   Overall Financial Resource Strain (CARDIA)    Difficulty of Paying Living Expenses: Not hard at all  Food Insecurity: No Food Insecurity (09/17/2021)   Hunger Vital Sign    Worried About Running Out of Food in the Last Year: Never true    Ran Out of Food in the Last Year: Never true  Transportation Needs: No Transportation Needs (09/17/2021)   PRAPARE - Hydrologist (Medical): No    Lack of Transportation (Non-Medical): No  Physical Activity: Inactive (09/17/2021)   Exercise Vital Sign    Days of Exercise per Week: 0 days    Minutes of Exercise per Session: 0 min  Stress: No Stress Concern Present (09/17/2021)   [place]    Feeling of Stress : Not at all  Social Connections: Moderately Integrated (09/17/2021)   Social Connection and Isolation Panel [NHANES]    Frequency of Communication with Friends and Family: More than three times a week    Frequency of Social Gatherings with Friends and Family: More than three times a week    Attends Religious Services: More than 4 times per year    Active  Member of [employer] or Organizations: Yes    Attends Music therapist: More than 4 times per year    Marital Status: Never married    Tobacco Counseling Counseling given: Not Answered   Clinical Intake:  Pre-visit preparation completed: Yes  Pain : 0-10 Pain Score: 8  Pain Type: Chronic pain Pain Location:  (back, rt arm/hand) Pain Orientation: Right Pain Descriptors / Indicators: Aching, Sharp, Shooting Pain Onset: More than a month ago Pain Frequency: Constant Pain Relieving Factors: tylenol  Pain Relieving Factors: tylenol  BMI - recorded: 29.83 Nutritional Status: BMI 25 -29 Overweight Nutritional Risks: None Diabetes: No  How often do you need to have someone help you when you read instructions, pamphlets, or other written materials from your doctor or pharmacy?: 1 - Never What is the last grade level you completed in school?: 12th grade  Diabetic?No  Interpreter Needed?: No  Information entered by :: Corey Skains Ron Beske,cma 09/17/21 9:24am   Activities of Daily Living    09/17/2021    9:27 AM 09/17/2021    9:21 AM  In your present state of health, do you have any difficulty performing the following activities:  Hearing? 0 0  Vision? 1 1  Difficulty concentrating or making decisions? 1 1  Walking or climbing stairs? 1 1  Dressing or bathing? 0 0  Doing errands, shopping? 0 0    Patient Care Team: Idamae Schuller, MD as PCP - [facility]  Indicate any recent Medical Services you may have received from other than [facility] providers in the past year (date may be approximate).     Assessment:   This is a routine wellness examination for Sandra Church.  Hearing/Vision screen No results found.  Dietary issues and exercise activities discussed:     Goals Addressed   None   Depression Screen    09/17/2021    9:27 AM 09/17/2021    9:20 AM 07/08/2021   11:52 AM 06/17/2021    9:16 AM 09/20/2020  9:53 AM 03/13/2020    8:55 AM 07/06/2019     5:00 PM  PHQ 2/9 Scores  PHQ - 2 Score 0 0 '4 2 2 '$ 0 0  PHQ- 9 Score   '14 13 10      '$ Fall Risk    09/17/2021    9:27 AM 09/17/2021    9:20 AM 07/08/2021   10:26 AM 06/17/2021    9:06 AM 11/06/2020    9:22 AM  Fall Risk   Falls in the past year? 1 1 0 0 0  Number falls in past yr: 0 0  0 0  Injury with Fall? 1 1  0 0  Risk for fall due to : Impaired balance/gait Impaired balance/gait Impaired balance/gait Impaired balance/gait   Risk for fall due to: Comment  tripped over rug getting out of the shower     Follow up Falls evaluation completed;Falls prevention discussed Falls evaluation completed;Falls prevention discussed Falls evaluation completed Falls evaluation completed;Falls prevention discussed Falls evaluation completed    FALL RISK PREVENTION PERTAINING TO THE HOME:  Any stairs in or around the home? Yes  If so, are there any without handrails? No  Home free of loose throw rugs in walkways, pet beds, electrical cords, etc? Yes  Adequate lighting in your home to reduce risk of falls? Yes   ASSISTIVE DEVICES UTILIZED TO PREVENT FALLS:  Life alert? No  Use of a cane, walker or w/c? Yes  Grab bars in the bathroom? Yes  Shower chair or bench in shower? Yes  Elevated toilet seat or a handicapped toilet? Yes   TIMED UP AND GO:  Was the test performed? No .  Length of time to ambulate 10 feet: 1 minute  Gait slow and steady without use of assistive device  Cognitive Function:        09/17/2021    9:27 AM  6CIT Screen  What Year? 0 points  What month? 0 points  What time? 0 points  Count back from 20 0 points  Months in reverse 0 points  Repeat phrase 0 points  Total Score 0 points    Immunizations Immunization History  Administered Date(s) Administered   Influenza,inj,Quad PF,6+ Mos 10/07/2016, 10/19/2017, 10/14/2019, 09/20/2020   Moderna Sars-Covid-2 Vaccination 04/23/2019, 05/21/2019   Tdap 07/01/2012    TDAP status: Up to date  Flu Vaccine status: Due,  Education has been provided regarding the importance of this vaccine. Advised may receive this vaccine at local pharmacy or Health Dept. Aware to provide a copy of the vaccination record if obtained from local pharmacy or Health Dept. Verbalized acceptance and understanding.   Covid-19 vaccine status: Completed vaccines  Qualifies for Shingles Vaccine? No   Zostavax completed No   Shingrix Completed?: No.    Education has been provided regarding the importance of this vaccine. Patient has been advised to call insurance company to determine out of pocket expense if they have not yet received this vaccine. Advised may also receive vaccine at local pharmacy or Health Dept. Verbalized acceptance and understanding.  Screening Tests Health Maintenance  Topic Date Due   Zoster Vaccines- Shingrix (1 of 2) Never done   COVID-19 Vaccine (3 - Moderna series) 07/16/2019   MAMMOGRAM  04/13/2020   INFLUENZA VACCINE  08/13/2021   TETANUS/TDAP  07/02/2022   PAP SMEAR-Modifier  04/14/2023   COLONOSCOPY (Pts 45-47yr Insurance coverage will need to be confirmed)  10/09/2027   Hepatitis C Screening  Completed   HIV Screening  Completed  HPV VACCINES  Aged Out    Health Maintenance  Health Maintenance Due  Topic Date Due   Zoster Vaccines- Shingrix (1 of 2) Never done   COVID-19 Vaccine (3 - Moderna series) 07/16/2019   MAMMOGRAM  04/13/2020   INFLUENZA VACCINE  08/13/2021    Colorectal cancer screening: Type of screening: Colonoscopy. Completed 10/08/2017. Repeat every 10 years   Bone Density status: Ordered 09/17/2021. Pt provided with contact info and advised to call to schedule appt.  Lung Cancer Screening: (Low Dose CT Chest recommended if Age 62-80 years, 30 pack-year currently smoking OR have quit w/in 15years.) does not qualify.   Lung Cancer Screening Referral: N/A  Additional Screening:  Hepatitis C Screening: does not qualify; Completed 08/18/2017  Vision Screening: Recommended  annual ophthalmology exams for early detection of glaucoma and other disorders of the eye. Is the patient up to date with their annual eye exam?  No  Who is the provider or what is the name of the office in which the patient attends annual eye exams? [facility] [address]. If pt is not established with a provider, would they like to be referred to a provider to establish care? No .   Dental Screening: Recommended annual dental exams for proper oral hygiene  Community Resource Referral / Chronic Care Management: CRR required this visit?  No   CCM required this visit?  No      Plan:     I have personally reviewed and noted the following in the patient's chart:   Medical and social history Use of alcohol, tobacco or illicit drugs  Current medications and supplements including opioid prescriptions. Patient is currently taking opioid prescriptions. Information provided to patient regarding non-opioid alternatives. Patient advised to discuss non-opioid treatment plan with their provider. Functional ability and status Nutritional status Physical activity Advanced directives List of other physicians Hospitalizations, surgeries, and ER visits in previous 12 months Vitals Screenings to include cognitive, depression, and falls Referrals and appointments  In addition, I have reviewed and discussed with patient certain preventive protocols, quality metrics, and best practice recommendations. A written personalized care plan for preventive services as well as general preventive health recommendations were provided to patient.     Kerin Perna, [facility]   09/17/2021   Nurse Notes: Face-to-Face 10 minute visit  Sandra Church , Thank you for taking time to come for your Medicare Wellness Visit. I appreciate your ongoing commitment to your health goals. Please review the following plan we discussed and let me know if I can assist you in the future.   These are the goals we discussed:  Goals    None     This is a list of the screening recommended for you and due dates:  Health Maintenance  Topic Date Due   Zoster (Shingles) Vaccine (1 of 2) Never done   COVID-19 Vaccine (3 - Moderna series) 07/16/2019   Mammogram  04/13/2020   Flu Shot  08/13/2021   Tetanus Vaccine  07/02/2022   Pap Smear  04/14/2023   Colon Cancer Screening  10/09/2027   Hepatitis C Screening: USPSTF Recommendation to screen - Ages 18-63 yo.  Completed   HIV Screening  Completed   HPV Vaccine  Aged Out

## 2021-09-17 NOTE — Assessment & Plan Note (Signed)
Patient states she will get mammogram through her gynecology office.

## 2021-09-17 NOTE — Assessment & Plan Note (Addendum)
Patient has HTN and is on Amlodipine-Valsartan-HCTZ 5-160-12.5.   BP in the clinic at 143/88. Cr 0.93 in 06/2021.  BP at goal at home and taking the medicine every other day. She states this is due to side effect of drowsiness she felt around the time of starting Duloxetine and didn't know what medication was causing it. This let her to discontinue multiple medications and then restarted them slowly. Counseled patient if this occurs again, then contact our office so we can guide her. Advised to take medication every day and notify the office if patient has hypotensive readings at home so adjustments can be made. Patient in agreement.  -Continue current regimen -Instructed to call clinic if home BP readings are <90/60. -Follow up in 3 months.

## 2021-09-17 NOTE — Patient Instructions (Addendum)
Sandra Church, it was a pleasure seeing you today! You endorsed feeling well today. Below are some of the things we talked about this visit. We look forward to seeing you in the follow up appointment!  Today we discussed: For your blood pressure, it was elevated today. Continue taking your medication every day.  For your acid reflux, slowly decrease your protonix to once a day from two tablets a day by taking one tablet every day and two tablets every other day. After one week you can just continue taking one tablet every day.  For your fibromyalgia, restart taking duloxetine at night with once capsule a night. If you get undesirable side effects, call our office and let us know.   Please get your mammogram done and get your shingles vaccine done.    I have ordered the following labs today:  Lab Orders  No laboratory test(s) ordered today      Referrals ordered today:   Referral Orders  No referral(s) requested today     I have ordered the following medication/changed the following medications:   Stop the following medications: Medications Discontinued During This Encounter  Medication Reason   DULoxetine (CYMBALTA) 30 MG capsule Discontinued by provider     Start the following medications: No orders of the defined types were placed in this encounter.    Follow-up: 3 months with PCP  Please make sure to arrive 15 minutes prior to your next appointment. If you arrive late, you may be asked to reschedule.   We look forward to seeing you next time. Please call our clinic at [phone] if you have any questions or concerns. The best time to call is Monday-Friday from 9am-4pm, but there is someone available 24/7. If after hours or the weekend, call the main hospital number and ask for the Internal Medicine Resident On-Call. If you need medication refills, please notify your pharmacy one week in advance and they will send [place] a request.  Thank you for letting us take part  in your care. Wishing you the best!  Thank you, Idamae Schuller, MD

## 2021-09-17 NOTE — Progress Notes (Signed)
CC: PCP follow up  HPI:  [name] is a 61 y.o. with medical history of HTN, HLD, and GERD presenting to [facility] for a PCP follow up.   Please see problem-based list for further details, assessments, and plans.  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 Alfonzo Church and receives occasional injections. States she was told by Dr. Vertell Church that she does not qualify for surgery    Constipation 07/22/2018   Tennis Must Quervain's disease (tenosynovitis) 01/17/2019   Fibromyalgia    Functional incontinence 10/07/2019   GERD (gastroesophageal reflux disease)    Heart murmur    yrs ago, heart murmur gone now   History of kidney stones    Hyperlipidemia    Hypertension    Jaundice    AGE 45 OR 21, food poisioning   Metatarsal deformity 02/01/2014   MVP (mitral valve prolapse)    Piriformis syndrome of right side 01/09/2017   Postmenopausal bleeding 12/31/2016   Pronation deformity of ankle, acquired 02/01/2014   Stable angina (HCC)    per pt no chest pain since 2017   Status post right foot surgery 04/12/2014   Superficial burn 07/08/2019   Urinary incontinence 10/13/2018   Wears glasses    Wears partial dentures    UPPER     Current Outpatient Medications (Cardiovascular):    amLODIPine-Valsartan-HCTZ 5-160-12.5 MG TABS, Take 1 tablet by mouth daily.   atorvastatin (LIPITOR) 20 MG tablet, Take 1 tablet (20 mg total) by mouth daily.  Current Outpatient Medications (Respiratory):    diphenhydrAMINE (BENADRYL) 25 mg capsule, Take 1 capsule (25 mg total) by mouth every 6 (six) hours as needed.  Current Outpatient Medications (Analgesics):    acetaminophen (TYLENOL) 500 MG tablet, Take 500 mg by mouth every 6 (six) hours as needed.   Current Outpatient Medications (Other):    chlorhexidine (PERIDEX) 0.12 % solution, See  admin instructions. Takes q day in pm (Patient not taking: Reported on 01/17/2021)   Cholecalciferol 20 MCG (800 UNIT) TABS, Take 1 tablet by mouth daily.   diclofenac Sodium (VOLTAREN) 1 % GEL, APPLY 2 GRAMS TO AFFECTED AREA 4 TIMES A DAY   DULoxetine (CYMBALTA) 30 MG capsule, Take 1 capsule (30 mg total) by mouth daily.   famotidine (PEPCID) 20 MG tablet, Take 1 tablet (20 mg total) by mouth 2 (two) times daily.   methocarbamol (ROBAXIN) 500 MG tablet, Take 1 tablet (500 mg total) by mouth 2 (two) times daily as needed for muscle spasms.   omeprazole (PRILOSEC) 40 MG capsule, TAKE 1 CAPSULE BY MOUTH TWICE A DAY  Review of Systems:  Review of system negative unless stated in the problem list or HPI.    Physical Exam:  Vitals:   09/17/21 0918 09/17/21 1018  BP: (!) 143/88 (!) 135/96  Pulse: (!) 101 84  SpO2: 99%   Weight: 184 lb 12.8 oz (83.8 kg)   Height: '5\' 6"'$  (1.676 m)     Physical Exam General: NAD HENT: NCAT Lungs: CTAB, no wheeze, rhonchi or rales.  Cardiovascular: Normal heart sounds, no r/m/g, 2+ pulses in all extremities. No LE edema Abdomen: No TTP, normal bowel sounds MSK: No asymmetry or muscle atrophy.  Skin: no lesions noted on exposed skin Neuro: Alert and oriented x4. CN grossly intact Psych: Normal mood and normal  affect   Assessment & Plan:   Hypertension Patient has HTN and is on Amlodipine-Valsartan-HCTZ 5-160-12.5.   BP in the clinic at 143/88. Cr 0.93 in 06/2021.  BP at goal at home and taking the medicine every other day. She states this is due to side effect of drowsiness she felt around the time of starting Duloxetine and didn't know what medication was causing it. This let her to discontinue multiple medications and then restarted them slowly. Counseled patient if this occurs again, then contact our office so we can guide her. Advised to take medication every day and notify the office if patient has hypotensive readings at home so adjustments can be made.  Patient in agreement.  -Continue current regimen -Instructed to call clinic if home BP readings are <90/60. -Follow up in 3 months.    GERD (gastroesophageal reflux disease) Patient has GERD with hx of gastric ulcers. She is on PPI 40 mg BID and Pepcid BID prn. She states in the last month she had 3-4 flares of her GERD. Advised patient to gradually decrease Prilosec to once daily to avoid long term negative side effects of this medication. Can use pepcid prn as needed.  -Decrease Prilosec to once daily, use pepcid prn -follow up in 3 months  Fibromyalgia Patient tried cymbalta and stated it made her drowsy and difficult to function during the day. She stated over the few days she tried it did help her with her pain but she could not tolerate the side effects of it. Advised patient to take the medication at night especially if it is benefiting the patient.   -Re-start duloxetine 30 mg at night. Will titrate to 60 mg if patient is able to tolerate this dose.   Preventative health care Patient states she will get mammogram through her gynecology office.    See Encounters Tab for problem based charting.  Patient discussed with Dr. Edrick Kins, MD Sandra Church. [name] Internal Medicine Residency, PGY-2

## 2021-09-18 NOTE — Addendum Note (Signed)
Addended by: Charise Killian on: 09/18/2021 01:39 PM   Modules accepted: Level of Service

## 2021-09-18 NOTE — Progress Notes (Signed)
Internal Medicine Clinic Attending  Case discussed with Dr. Khan  At the time of the visit.  We reviewed the resident's history and exam and pertinent patient test results.  I agree with the assessment, diagnosis, and plan of care documented in the resident's note.  

## 2021-12-31 DIAGNOSIS — Z1389 Encounter for screening for other disorder: Secondary | ICD-10-CM | POA: Diagnosis not present

## 2021-12-31 DIAGNOSIS — Z1231 Encounter for screening mammogram for malignant neoplasm of breast: Secondary | ICD-10-CM | POA: Diagnosis not present

## 2021-12-31 DIAGNOSIS — N951 Menopausal and female climacteric states: Secondary | ICD-10-CM | POA: Diagnosis not present

## 2021-12-31 DIAGNOSIS — Z01419 Encounter for gynecological examination (general) (routine) without abnormal findings: Secondary | ICD-10-CM | POA: Diagnosis not present

## 2022-02-25 ENCOUNTER — Encounter: Payer: Self-pay | Admitting: Internal Medicine

## 2022-02-25 ENCOUNTER — Ambulatory Visit: Payer: Medicaid Other | Admitting: Internal Medicine

## 2022-02-25 VITALS — BP 127/74 | HR 88 | Temp 97.8°F | Ht 66.0 in | Wt 186.2 lb

## 2022-02-25 DIAGNOSIS — R202 Paresthesia of skin: Secondary | ICD-10-CM

## 2022-02-25 DIAGNOSIS — E559 Vitamin D deficiency, unspecified: Secondary | ICD-10-CM

## 2022-02-25 DIAGNOSIS — E785 Hyperlipidemia, unspecified: Secondary | ICD-10-CM | POA: Diagnosis not present

## 2022-02-25 DIAGNOSIS — R200 Anesthesia of skin: Secondary | ICD-10-CM

## 2022-02-25 DIAGNOSIS — M797 Fibromyalgia: Secondary | ICD-10-CM

## 2022-02-25 DIAGNOSIS — R252 Cramp and spasm: Secondary | ICD-10-CM | POA: Diagnosis not present

## 2022-02-25 DIAGNOSIS — I10 Essential (primary) hypertension: Secondary | ICD-10-CM | POA: Diagnosis not present

## 2022-02-25 DIAGNOSIS — Z23 Encounter for immunization: Secondary | ICD-10-CM

## 2022-02-25 DIAGNOSIS — K219 Gastro-esophageal reflux disease without esophagitis: Secondary | ICD-10-CM | POA: Diagnosis not present

## 2022-02-25 DIAGNOSIS — M1990 Unspecified osteoarthritis, unspecified site: Secondary | ICD-10-CM | POA: Diagnosis not present

## 2022-02-25 DIAGNOSIS — Z0000 Encounter for general adult medical examination without abnormal findings: Secondary | ICD-10-CM

## 2022-02-25 MED ORDER — DICLOFENAC SODIUM 1 % EX GEL: 4.0000 g | Freq: Four times a day (QID) | CUTANEOUS | 15 refills | Status: DC

## 2022-02-25 NOTE — Assessment & Plan Note (Signed)
Vitamin D deficiency 24.8 in 06/2021 on oral replacement with 800 units daily. Will repeat vitamin D testing and increase her supplementation accordingly to improve her vitamin D level to >50 to see if pt notices any improvement in her fibromyalgia.

## 2022-02-25 NOTE — Assessment & Plan Note (Signed)
BP well controlled. On Amlodipine-Valsartan-HCTZ 5-160-12.5 mg qd. Normal renal fxn during last BMP. Plan to repeat BMP this visit.

## 2022-02-25 NOTE — Assessment & Plan Note (Signed)
Pt has hyperlipidemia and his LDL was 77 in 06/2021. It is well controlled. She is on lipitor 20 mg qd. Will repeat lipid panel with blood work.

## 2022-02-25 NOTE — Progress Notes (Signed)
CC: Follow up  HPI:  [name] is a 62 y.o. with medical history of HTN, HLD, OA, GERD,  presenting to [facility] for a PCP follow up.   Please see problem-based list for further details, assessments, and plans.  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 Alfonzo Beers and receives occasional injections. States she was told by Dr. Vertell Limber that she does not qualify for surgery    Constipation 07/22/2018   Tennis Must Quervain's disease (tenosynovitis) 01/17/2019   Fibromyalgia    Functional incontinence 10/07/2019   GERD (gastroesophageal reflux disease)    Heart murmur    yrs ago, heart murmur gone now   History of kidney stones    Hyperlipidemia    Hypertension    Jaundice    AGE 27 OR 21, food poisioning   Medial epicondylitis of elbow, left 03/13/2020   Metatarsal deformity 02/01/2014   MVP (mitral valve prolapse)    Piriformis syndrome of right side 01/09/2017   Postmenopausal bleeding 12/31/2016   Pronation deformity of ankle, acquired 02/01/2014   Stable angina    per pt no chest pain since 2017   Status post right foot surgery 04/12/2014   Superficial burn 07/08/2019   Urinary incontinence 10/13/2018   Wears glasses    Wears partial dentures    UPPER     Current Outpatient Medications (Cardiovascular):    amLODIPine-Valsartan-HCTZ 5-160-12.5 MG TABS, Take 1 tablet by mouth daily.   atorvastatin (LIPITOR) 20 MG tablet, Take 1 tablet (20 mg total) by mouth daily.  Current Outpatient Medications (Respiratory):    diphenhydrAMINE (BENADRYL) 25 mg capsule, Take 1 capsule (25 mg total) by mouth every 6 (six) hours as needed.  Current Outpatient Medications (Analgesics):    acetaminophen (TYLENOL) 500 MG tablet, Take 500 mg by mouth every 6 (six) hours as needed.   Current Outpatient Medications  (Other):    diclofenac Sodium (VOLTAREN) 1 % GEL, Apply 4 g topically 4 (four) times daily.   Cholecalciferol 20 MCG (800 UNIT) TABS, Take 1 tablet by mouth daily.   diclofenac Sodium (VOLTAREN) 1 % GEL, APPLY 2 GRAMS TO AFFECTED AREA 4 TIMES A DAY   DULoxetine (CYMBALTA) 30 MG capsule, Take 1 capsule (30 mg total) by mouth daily.   famotidine (PEPCID) 20 MG tablet, Take 1 tablet (20 mg total) by mouth 2 (two) times daily.   methocarbamol (ROBAXIN) 500 MG tablet, Take 1 tablet (500 mg total) by mouth 2 (two) times daily as needed for muscle spasms.   omeprazole (PRILOSEC) 40 MG capsule, TAKE 1 CAPSULE BY MOUTH TWICE A DAY  Review of Systems:  Review of system negative unless stated in the problem list or HPI.    Physical Exam:  Vitals:   02/25/22 1029  BP: 127/74  Pulse: 88  Temp: 97.8 F (36.6 C)  TempSrc: Oral  SpO2: 99%  Weight: 186 lb 3.2 oz (84.5 kg)  Height: 5' 6"$  (1.676 m)    Physical Exam General: NAD HENT: NCAT Lungs: CTAB, no wheeze, rhonchi or rales.  Cardiovascular: Normal heart sounds, no r/m/g, 2+ pulses in all extremities. No LE edema Abdomen: No TTP, normal bowel sounds MSK: No asymmetry or muscle atrophy.  Skin: no lesions noted on exposed skin Neuro: Alert and oriented x4. CN grossly intact Psych: Normal mood and  normal affect   Assessment & Plan:   Hypertension BP well controlled. On Amlodipine-Valsartan-HCTZ 5-160-12.5 mg qd. Normal renal fxn during last BMP. Plan to repeat BMP this visit.   GERD (gastroesophageal reflux disease) On prilosec 40 mg BID and pepcid 20 mg prn. Was seen by GI last year and EGD showed a healed gastric ulcer. Will continue current therapy for now.   Hyperlipidemia Pt has hyperlipidemia and his LDL was 77 in 06/2021. It is well controlled. She is on lipitor 20 mg qd. Will repeat lipid panel with blood work.   Vitamin D deficiency Vitamin D deficiency 24.8 in 06/2021 on oral replacement with 800 units daily. Will repeat  vitamin D testing and increase her supplementation accordingly to improve her vitamin D level to >50 to see if pt notices any improvement in her fibromyalgia.   Osteoarthritis Pt has OA of her hands. States using voltaren gel and thermal therapy helps her OA. Advised pt to continue current measures and add lidocaine gel for symptomatic management.   Fibromyalgia Pt has fibromyalgia and notes continued symptoms. She states she uses multimodal approach to alleviate her symptoms including thermal therapy, Voltaren gel, exercise as tolerated and duloxetine 30 mg every other night. She states the duloxetine helps her rest at night. Advised pt to increase this to 30 mg to see if she notices any improvement and we can increase her to 50 mg qhs if she is able to tolerate it. Will also replenish her vitamin D level to >50 to see if it improves symptoms.   Preventative health care Flu shot given this visit. Pt states Mammogram was done few months ago. Will need records for this.    See Encounters Tab for problem based charting.  Patient discussed with Dr. Roderic Ovens, MD [facility] Internal Medicine Residency, PGY-2

## 2022-02-25 NOTE — Assessment & Plan Note (Signed)
On prilosec 40 mg BID and pepcid 20 mg prn. Was seen by GI last year and EGD showed a healed gastric ulcer. Will continue current therapy for now.

## 2022-02-25 NOTE — Assessment & Plan Note (Signed)
Flu shot given this visit. Pt states Mammogram was done few months ago. Will need records for this.

## 2022-02-25 NOTE — Assessment & Plan Note (Signed)
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 States using voltaren gel and thermal therapy helps her OA. Advised pt to continue current measures and add lidocaine gel for symptomatic management.

## 2022-02-25 NOTE — Patient Instructions (Addendum)
[name], it was a pleasure seeing you today! You endorsed feeling well today. Below are some of the things we talked about this visit. We look forward to seeing you in the follow up appointment!  Today we discussed: Please take all of your medications. I have provided the needed refills.  You can use lidocaine cream for your hands and put lidocaine patches on your back for further relief.  We will check lab work today.   I have ordered the following labs today:   Lab Orders         BMP8+Anion Gap         Lipid Profile         Vitamin D (25 hydroxy)       Referrals ordered today:   Referral Orders  No referral(s) requested today     I have ordered the following medication/changed the following medications:   Stop the following medications: Medications Discontinued During This Encounter  Medication Reason   chlorhexidine (PERIDEX) 0.12 % solution Patient has not taken in last 30 days     Start the following medications: Meds ordered this encounter  Medications   diclofenac Sodium (VOLTAREN) 1 % GEL    Sig: Apply 4 g topically 4 (four) times daily.    Dispense:  350 g    Refill:  15     Follow-up: 3 month follow up with Dr. Humphrey Rolls  Please make sure to arrive 15 minutes prior to your next appointment. If you arrive late, you may be asked to reschedule.   We look forward to seeing you next time. Please call our clinic at [phone] if you have any questions or concerns. The best time to call is Monday-Friday from 9am-4pm, but there is someone available 24/7. If after hours or the weekend, call the main hospital number and ask for the Internal Medicine Resident On-Call. If you need medication refills, please notify your pharmacy one week in advance and they will send [place] a request.  Thank you for letting us take part in your care. Wishing you the best!  Thank you, Idamae Schuller, MD

## 2022-02-25 NOTE — Assessment & Plan Note (Signed)
Pt has fibromyalgia and notes continued symptoms. She states she uses multimodal approach to alleviate her symptoms including thermal therapy, Voltaren gel, exercise as tolerated and duloxetine 30 mg every other night. She states the duloxetine helps her rest at night. Advised pt to increase this to 30 mg to see if she notices any improvement and we can increase her to 50 mg qhs if she is able to tolerate it. Will also replenish her vitamin D level to >50 to see if it improves symptoms.

## 2022-02-26 LAB — BMP8+ANION GAP
Anion Gap: 19 mmol/L — ABNORMAL HIGH (ref 10.0–18.0)
BUN/Creatinine Ratio: 20 (ref 12–28)
BUN: 15 mg/dL (ref 8–27)
CO2: 20 mmol/L (ref 20–29)
Calcium: 10 mg/dL (ref 8.7–10.3)
Chloride: 101 mmol/L (ref 96–106)
Creatinine, Ser: 0.76 mg/dL (ref 0.57–1.00)
Glucose: 97 mg/dL (ref 70–99)
Potassium: 4 mmol/L (ref 3.5–5.2)
Sodium: 140 mmol/L (ref 134–144)
eGFR: 88 mL/min/{1.73_m2} (ref >59)

## 2022-02-26 LAB — VITAMIN B12: Vitamin B-12: 931 pg/mL (ref 232–1245)

## 2022-02-26 LAB — LIPID PANEL
Chol/HDL Ratio: 2.8 ratio (ref 0.0–4.4)
Cholesterol, Total: 168 mg/dL (ref 100–199)
HDL: 61 mg/dL (ref >39)
LDL Chol Calc (NIH): 92 mg/dL (ref 0–99)
Triglycerides: 79 mg/dL (ref 0–149)
VLDL Cholesterol Cal: 15 mg/dL (ref 5–40)

## 2022-02-26 LAB — MAGNESIUM: Magnesium: 2.1 mg/dL (ref 1.6–2.3)

## 2022-02-26 LAB — VITAMIN D 25 HYDROXY (VIT D DEFICIENCY, FRACTURES): Vit D, 25-Hydroxy: 27.9 ng/mL — ABNORMAL LOW (ref 30.0–100.0)

## 2022-02-27 NOTE — Progress Notes (Signed)
Internal Medicine Clinic Attending  Case discussed with the resident at the time of the visit.  We reviewed the resident's history and exam and pertinent patient test results.  I agree with the assessment, diagnosis, and plan of care documented in the resident's note.  

## 2022-04-01 ENCOUNTER — Other Ambulatory Visit: Payer: Self-pay | Admitting: Internal Medicine

## 2022-04-01 DIAGNOSIS — I10 Essential (primary) hypertension: Secondary | ICD-10-CM

## 2022-04-02 ENCOUNTER — Other Ambulatory Visit: Payer: Self-pay | Admitting: Internal Medicine

## 2022-04-02 DIAGNOSIS — I10 Essential (primary) hypertension: Secondary | ICD-10-CM

## 2022-04-02 MED ORDER — AMLODIPINE-VALSARTAN-HCTZ 5-160-12.5 MG PO TABS: 1.0000 | ORAL_TABLET | Freq: Every day | ORAL | 3 refills | Status: DC

## 2022-05-27 ENCOUNTER — Ambulatory Visit (INDEPENDENT_AMBULATORY_CARE_PROVIDER_SITE_OTHER): Payer: Medicaid Other | Admitting: Student

## 2022-05-27 ENCOUNTER — Encounter: Payer: Self-pay | Admitting: Student

## 2022-05-27 VITALS — BP 139/98 | HR 79 | Wt 192.6 lb

## 2022-05-27 DIAGNOSIS — I10 Essential (primary) hypertension: Secondary | ICD-10-CM | POA: Diagnosis not present

## 2022-05-27 DIAGNOSIS — G8929 Other chronic pain: Secondary | ICD-10-CM | POA: Diagnosis not present

## 2022-05-27 DIAGNOSIS — M797 Fibromyalgia: Secondary | ICD-10-CM | POA: Diagnosis not present

## 2022-05-27 DIAGNOSIS — E559 Vitamin D deficiency, unspecified: Secondary | ICD-10-CM | POA: Diagnosis not present

## 2022-05-27 DIAGNOSIS — M5440 Lumbago with sciatica, unspecified side: Secondary | ICD-10-CM | POA: Diagnosis not present

## 2022-05-27 DIAGNOSIS — Z0000 Encounter for general adult medical examination without abnormal findings: Secondary | ICD-10-CM

## 2022-05-27 DIAGNOSIS — M1990 Unspecified osteoarthritis, unspecified site: Secondary | ICD-10-CM

## 2022-05-27 MED ORDER — AMLODIPINE-VALSARTAN-HCTZ 5-160-12.5 MG PO TABS
1.0000 | ORAL_TABLET | Freq: Every day | ORAL | 3 refills | Status: DC
Start: 2022-05-27 — End: 2022-12-31

## 2022-05-27 MED ORDER — DULOXETINE HCL 60 MG PO CPEP
60.0000 mg | ORAL_CAPSULE | Freq: Every day | ORAL | 3 refills | Status: DC
Start: 2022-05-27 — End: 2022-12-31

## 2022-05-27 MED ORDER — DICLOFENAC SODIUM 1 % EX GEL
4.0000 g | Freq: Four times a day (QID) | CUTANEOUS | 15 refills | Status: DC
Start: 2022-05-27 — End: 2023-04-03

## 2022-05-27 NOTE — Progress Notes (Signed)
Internal Medicine Clinic Attending  Case discussed with Dr. Geraldo Pitter  At the time of the visit.  We reviewed the resident's history and exam and pertinent patient test results.  I agree with the assessment, diagnosis, and plan of care documented in the resident's note.    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 She has tried a variety of medicines and other modalities (PT, steroid injections) with limited relief. I agree with plan to try an increased dose of Duloxetine. In the future, could discuss Gabapentin if she is experiencing more neuropathic pain.

## 2022-05-27 NOTE — Patient Instructions (Addendum)
  Thank you, Ms.Neisha L Tresa Res, for allowing [place] to provide your care today. Today we discussed . . .  > Chronic pain       -Today I am going to increase your duloxetine to 60 mg nightly.  As we discussed our options after trying to tweak some your medications is to consider surgery and/or opiate therapy.  I am going to talk to Dr. Welton Flakes about this before we start you on any opiates.  We will have you come back in 1 month to discuss starting this if we would like to do that.  In that time we will see if the duloxetine increase helps as well. > Vaccines and mammogram       -You can get your shingles vaccine at any pharmacy and it will be 2 shots 1 month apart.  Please let us know when you have completed this.  We will also continue to try to get the results of your recent mammogram.  If you do have them at home please bring them with you at your next visit. > Vitamin D deficiency       -We will recheck your vitamin D today and I will call you with results and let you know if we need to change your vitamin D supplementation.   I have ordered the following labs for you:  Lab Orders         Vitamin D (25 hydroxy)       Referrals ordered today:   Referral Orders  No referral(s) requested today      I have ordered the following medication/changed the following medications:    Start the following medications: Meds ordered this encounter  Medications   amLODIPine-Valsartan-HCTZ 5-160-12.5 MG TABS    Sig: Take 1 tablet by mouth daily.    Dispense:  90 tablet    Refill:  3   diclofenac Sodium (VOLTAREN) 1 % GEL    Sig: Apply 4 g topically 4 (four) times daily.    Dispense:  350 g    Refill:  15   DULoxetine (CYMBALTA) 60 MG capsule    Sig: Take 1 capsule (60 mg total) by mouth daily.    Dispense:  90 capsule    Refill:  3      Follow up:  1 month   Remember:     Should you have any questions or concerns please call the internal medicine clinic at [phone].     Rocky Morel, DO [facility]

## 2022-05-27 NOTE — Progress Notes (Signed)
CC: Chronic back pain  HPI:  Sandra Church is a 62 y.o. female with PMH as below who presents to the clinic for follow-up visit with concerns about her chronic back pain.  Please see assessment and plan for further details.  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 Inda Merlin and receives occasional injections. States she was told by Dr. Venetia Maxon that she does not qualify for surgery    Constipation 07/22/2018   Tommi Rumps Quervain's disease (tenosynovitis) 01/17/2019   Fibromyalgia    Functional incontinence 10/07/2019   GERD (gastroesophageal reflux disease)    Heart murmur    yrs ago, heart murmur gone now   History of kidney stones    Hyperlipidemia    Hypertension    Jaundice    AGE 58 OR 21, food poisioning   Medial epicondylitis of elbow, left 03/13/2020   Metatarsal deformity 02/01/2014   MVP (mitral valve prolapse)    Piriformis syndrome of right side 01/09/2017   Postmenopausal bleeding 12/31/2016   Pronation deformity of ankle, acquired 02/01/2014   Stable angina    per pt no chest pain since 2017   Status post right foot surgery 04/12/2014   Superficial burn 07/08/2019   Urinary incontinence 10/13/2018   Wears glasses    Wears partial dentures    UPPER   Review of Systems:   Pertinent items noted in HPI and/or A&P.  Physical Exam:  Vitals:   05/27/22 1008  BP: (!) 139/98  Pulse: 79  SpO2: 99%  Weight: 192 lb 9.6 oz (87.4 kg)    Constitutional: Tired appearing elderly female. In no acute distress. HEENT: Normocephalic, atraumatic, Sclera non-icteric, PERRL, EOM intact Cardio:Regular rate and rhythm. 2+ bilateral radial and dorsalis pedis  pulses. Pulm:Clear to auscultation bilaterally. Normal work of breathing on room air. Abdomen: Soft, non-tender, non-distended, positive bowel  sounds. ZOX:WRUEAVWU for extremity edema. Skin:Warm and dry. Neuro:Alert and oriented x3. No focal deficit noted.  Ambulates well with a cane. Psych:Pleasant mood and affect.   Assessment & Plan:   Hypertension Blood pressure is elevated today at 139/98 but patient has had an issue getting her medication.  She is prescribed amlodipine-valsartan-HCTZ combo pill but after losing a prescription in March and then having a pharmacy mixup she picked up amlodipine-valsartan combo pill.  I will refill the correct medication today. 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  Failed treatments include NSAIDs with resulting gastritis, Flexeril due to drowsiness, Robaxin 500 mg twice daily due to lack of effect, Tylenol 1000 mg 3 times daily, Voltaren gel, lidocaine patches, ice/heat, physical therapy, epidural steroid injections, and duloxetine 30 mg daily.  MRI from 2022 showed multilevel stenosis and she was counseled on surgical options but she decided not to do surgery.  Today she continues to have pain that is impactful on her life and does not allow her to do the things she wants to do.  She is currently on duloxetine 30 mg daily, Tylenol 1000 mg 3 times daily, Voltaren gel, lidocaine patches, and ice/heat.  History is complicated by fibromyalgia and osteoarthritis with prior right knee replacement.  We discussed options including increasing duloxetine, trying  Flexeril or higher dose Robaxin, surgery, and initiating opiate therapy.  Realistic goals of tolerable pain were discussed.  Plan: - Increase duloxetine to 60 mg daily - If needed we can add Flexeril or Robaxin at [phone] mg TID/QID - She will return in 5 weeks to see me or her PCP to evaluate effect of these changes and to further discuss opiate therapy if  needed  Vitamin D deficiency Patient is on daily vitamin D supplements but has not been taking these consistently.  We will recheck her vitamin D today and if still low we will start weekly high-dose vitamin D and plan to recheck vitamin D in 8 weeks.  Preventative health care Patient states she had a mammogram 6 months ago with her OB/GYN.  She is advised to bring in the records as she can.  She plans to get her shingles vaccine at the pharmacy.  Fibromyalgia Please see chronic low back pain for more details.  Her chronic pain she is because exacerbation of her fibromyalgia so in an attempt to address both we have increased duloxetine to 60 mg daily.  She has previously used this dose and had some drowsiness.  She will call if she has these effects. - Increase duloxetine to 60 mg daily    Patient discussed with Dr. Duwayne Heck, DO [facility] Internal Medicine Resident PGY-1 Pager: [phone]

## 2022-05-27 NOTE — Assessment & Plan Note (Signed)
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  Failed treatments include NSAIDs with resulting gastritis, Flexeril due to drowsiness, Robaxin 500 mg twice daily due to lack of effect, Tylenol 1000 mg 3 times daily, Voltaren gel, lidocaine patches, ice/heat, physical therapy, epidural steroid injections, and duloxetine 30 mg daily.  MRI from 2022 showed multilevel stenosis and she was counseled on surgical options but she decided not to do surgery.  Today she continues to have pain that is impactful on her life and does not allow her to do the things she wants to do.  She is currently on duloxetine 30 mg daily, Tylenol 1000 mg 3 times daily, Voltaren gel, lidocaine patches, and ice/heat.  History is complicated by fibromyalgia and osteoarthritis with prior right knee replacement.  We discussed options including increasing duloxetine, trying Flexeril or higher dose Robaxin, surgery, and initiating opiate therapy.  Realistic goals of tolerable pain were discussed.  Plan: - Increase duloxetine to 60 mg daily - If needed we can add Flexeril or Robaxin at [phone] mg TID/QID - She will return in 5 weeks to see me or her PCP to evaluate effect of these changes and to further discuss opiate therapy if needed

## 2022-05-27 NOTE — Assessment & Plan Note (Signed)
Blood pressure is elevated today at 139/98 but patient has had an issue getting her medication.  She is prescribed amlodipine-valsartan-HCTZ combo pill but after losing a prescription in March and then having a pharmacy mixup she picked up amlodipine-valsartan combo pill.  I will refill the correct medication today. - Continue amlodipine-valsartan-HCTZ 5-160-12.5 mg daily

## 2022-05-27 NOTE — Assessment & Plan Note (Signed)
Patient is on daily vitamin D supplements but has not been taking these consistently.  We will recheck her vitamin D today and if still low we will start weekly high-dose vitamin D and plan to recheck vitamin D in 8 weeks.

## 2022-05-27 NOTE — Assessment & Plan Note (Signed)
Patient states she had a mammogram 6 months ago with her OB/GYN.  She is advised to bring in the records as she can.  She plans to get her shingles vaccine at the pharmacy.

## 2022-05-27 NOTE — Assessment & Plan Note (Signed)
Please see chronic low back pain for more details.  Her chronic pain she is because exacerbation of her fibromyalgia so in an attempt to address both we have increased duloxetine to 60 mg daily.  She has previously used this dose and had some drowsiness.  She will call if she has these effects. - Increase duloxetine to 60 mg daily

## 2022-05-28 LAB — VITAMIN D 25 HYDROXY (VIT D DEFICIENCY, FRACTURES): Vit D, 25-Hydroxy: 22.3 ng/mL — ABNORMAL LOW (ref 30.0–100.0)

## 2022-05-29 ENCOUNTER — Other Ambulatory Visit: Payer: Self-pay | Admitting: Student

## 2022-05-29 DIAGNOSIS — G8929 Other chronic pain: Secondary | ICD-10-CM

## 2022-05-29 DIAGNOSIS — E559 Vitamin D deficiency, unspecified: Secondary | ICD-10-CM

## 2022-05-29 MED ORDER — VITAMIN D (ERGOCALCIFEROL) 1.25 MG (50000 UNIT) PO CAPS
50000.0000 [IU] | ORAL_CAPSULE | ORAL | 0 refills | Status: DC
Start: 2022-05-29 — End: 2022-07-29

## 2022-05-29 MED ORDER — METHOCARBAMOL 750 MG PO TABS
750.0000 mg | ORAL_TABLET | Freq: Four times a day (QID) | ORAL | 1 refills | Status: AC
Start: 2022-05-29 — End: ?

## 2022-05-29 NOTE — Progress Notes (Signed)
Called to speak to patient about vitamin D deficiency and starting high-dose vitamin D supplementation.  We had discussed increasing duloxetine at her clinic visit and this did indeed make her more drowsy.  She is agreeable to try a higher dose of Robaxin that she has in the past instead.  She will start Robaxin 750 mg every 6 hours.  She will continue on duloxetine 30 mg daily which she has tolerated well.  Rocky Morel, DO Internal Medicine Resident, PGY-1

## 2022-05-29 NOTE — Progress Notes (Signed)
Called and spoke to the patient about her vitamin D level.  Vitamin D level still very low so we will start an 8-week course of weekly cholecalciferol 50,000 units and recheck her vitamin D in 8 weeks.

## 2022-06-27 ENCOUNTER — Encounter: Payer: Medicaid Other | Admitting: Internal Medicine

## 2022-07-22 ENCOUNTER — Telehealth: Payer: Self-pay

## 2022-07-22 NOTE — Telephone Encounter (Signed)
Chart review completed for patient. Patient is due for screening mammogram. Left message on voice mail for patient to inquire about scheduling mammogram.  Shaketa Serafin, Population Health Specialist.   

## 2022-07-28 ENCOUNTER — Other Ambulatory Visit: Payer: Self-pay | Admitting: Student

## 2022-07-28 DIAGNOSIS — E559 Vitamin D deficiency, unspecified: Secondary | ICD-10-CM

## 2022-09-07 NOTE — Progress Notes (Unsigned)
CC: Lumbar Pain   HPI:  [name] is a 62 y.o. with medical history of HTN, HLD, OA, GERD,  presenting to [facility] for a follow up on lumbar pain .  Please see problem-based list for further details, assessments, and plans.  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 Inda Merlin and receives occasional injections. States she was told by Dr. Venetia Maxon that she does not qualify for surgery    Constipation 07/22/2018   Tommi Rumps Quervain's disease (tenosynovitis) 01/17/2019   Fibromyalgia    Functional incontinence 10/07/2019   GERD (gastroesophageal reflux disease)    Heart murmur    yrs ago, heart murmur gone now   History of kidney stones    Hyperlipidemia    Hypertension    Jaundice    AGE 29 OR 21, food poisioning   Medial epicondylitis of elbow, left 03/13/2020   Metatarsal deformity 02/01/2014   MVP (mitral valve prolapse)    Piriformis syndrome of right side 01/09/2017   Postmenopausal bleeding 12/31/2016   Pronation deformity of ankle, acquired 02/01/2014   Stable angina    per pt no chest pain since 2017   Status post right foot surgery 04/12/2014   Superficial burn 07/08/2019   Urinary incontinence 10/13/2018   Wears glasses    Wears partial dentures    UPPER     Current Outpatient Medications (Cardiovascular):    amLODIPine-Valsartan-HCTZ 5-160-12.5 MG TABS, Take 1 tablet by mouth daily.   atorvastatin (LIPITOR) 20 MG tablet, Take 1 tablet (20 mg total) by mouth daily.  Current Outpatient Medications (Respiratory):    diphenhydrAMINE (BENADRYL) 25 mg capsule, Take 1 capsule (25 mg total) by mouth every 6 (six) hours as needed.  Current Outpatient Medications (Analgesics):    acetaminophen (TYLENOL) 500 MG tablet, Take 500 mg by mouth every 6 (six) hours as needed.   Current Outpatient  Medications (Other):    Cholecalciferol 20 MCG (800 UNIT) TABS, Take 1 tablet by mouth daily.   diclofenac Sodium (VOLTAREN) 1 % GEL, Apply 4 g topically 4 (four) times daily.   DULoxetine (CYMBALTA) 60 MG capsule, Take 1 capsule (60 mg total) by mouth daily.   famotidine (PEPCID) 20 MG tablet, Take 1 tablet (20 mg total) by mouth 2 (two) times daily.   methocarbamol (ROBAXIN-750) 750 MG tablet, Take 1 tablet (750 mg total) by mouth 4 (four) times daily.   omeprazole (PRILOSEC) 40 MG capsule, TAKE 1 CAPSULE BY MOUTH TWICE A DAY   Vitamin D, Ergocalciferol, (DRISDOL) 1.25 MG (50000 UNIT) CAPS capsule, TAKE 1 CAPSULE BY MOUTH EVERY 7 DAYS  Review of Systems:  Review of system negative unless stated in the problem list or HPI.    Physical Exam:  Vitals:   09/08/22 0923  BP: 109/74  Pulse: 90  Temp: 98 F (36.7 C)  SpO2: 98%  Weight: 181 lb (82.1 kg)  Height: 5\' 7"  (1.702 m)   Physical Exam General: NAD HENT: NCAT Lungs: CTAB, no wheeze, rhonchi or rales.  Cardiovascular: Normal heart sounds, no r/m/g, 2+ pulses in all extremities. No LE edema Abdomen: No TTP, normal bowel sounds MSK: No asymmetry or muscle atrophy.  Skin: no lesions noted on exposed skin Neuro: Alert and oriented x4. CN grossly intact Psych: Normal mood and normal affect  Assessment & Plan:  Hypertension Well controlled on triple combo pill. No complaints of chest pain, dizziness, headache or shortness of breath. Plan to continue current regimen. Normal renal function 02/2022.   Vitamin D deficiency Vitamin D Deficiency: Level was 22 in 05/2022. Pt states she completed high dose vitamin D. Will check level today and add 1000-2000 units daily based on results for maintenance.   Chronic low back pain with sciatica Pt states the pain is doing well. Robaxin, tylenol, lidocaine patches and thermal therapy work and gabapentin, lyrica and duloxetine does not work for her due to side effects. Discussed restarting low  dose oxycodone for as needed pain but pt stated she does not like strong medications like that. She still has some functional limitation from the back pain. Discussed referral to physical therapy and pt is very receptive. Referral placed.    See Encounters Tab for problem based charting.  Patient Discussed with Dr. Tanja Port, MD Eligha Bridegroom. [facility] Res[facility], PGY-3

## 2022-09-08 ENCOUNTER — Ambulatory Visit: Payer: Medicaid Other | Admitting: Internal Medicine

## 2022-09-08 ENCOUNTER — Other Ambulatory Visit: Payer: Self-pay

## 2022-09-08 ENCOUNTER — Encounter: Payer: Self-pay | Admitting: Internal Medicine

## 2022-09-08 VITALS — BP 109/74 | HR 90 | Temp 98.0°F | Ht 67.0 in | Wt 181.0 lb

## 2022-09-08 DIAGNOSIS — M5440 Lumbago with sciatica, unspecified side: Secondary | ICD-10-CM

## 2022-09-08 DIAGNOSIS — I10 Essential (primary) hypertension: Secondary | ICD-10-CM | POA: Diagnosis not present

## 2022-09-08 DIAGNOSIS — E559 Vitamin D deficiency, unspecified: Secondary | ICD-10-CM

## 2022-09-08 DIAGNOSIS — G8929 Other chronic pain: Secondary | ICD-10-CM | POA: Diagnosis not present

## 2022-09-08 DIAGNOSIS — Z23 Encounter for immunization: Secondary | ICD-10-CM

## 2022-09-08 NOTE — Assessment & Plan Note (Signed)
Vitamin D Deficiency: Level was 22 in 05/2022. Pt states she completed high dose vitamin D. Will check level today and add 1000-2000 units daily based on results for maintenance.

## 2022-09-08 NOTE — Patient Instructions (Signed)
Ms.Arlean L Tresa Res, it was a pleasure seeing you today! You endorsed feeling well today. Below are some of the things we talked about this visit. We look forward to seeing you in the follow up appointment!  Today we discussed: Your blood pressure looks great! Continue taking your medicine. For your back pain, I placed a referral to physical therapy.  For your vitamin D deficiency, we will check your level today.  We will also give you TDAP shot.   I have ordered the following labs today:  Lab Orders  No laboratory test(s) ordered today      Referrals ordered today:   Referral Orders  No referral(s) requested today     I have ordered the following medication/changed the following medications:   Stop the following medications: There are no discontinued medications.   Start the following medications: No orders of the defined types were placed in this encounter.    Follow-up: 3-4 month follow up with Dr. Welton Flakes   Please make sure to arrive 15 minutes prior to your next appointment. If you arrive late, you may be asked to reschedule.   We look forward to seeing you next time. Please call our clinic at [phone] if you have any questions or concerns. The best time to call is Monday-Friday from 9am-4pm, but there is someone available 24/7. If after hours or the weekend, call the main hospital number and ask for the Internal Medicine Resident On-Call. If you need medication refills, please notify your pharmacy one week in advance and they will send [place] a request.  Thank you for letting us take part in your care. Wishing you the best!  Thank you, Gwenevere Abbot, MD

## 2022-09-08 NOTE — Assessment & Plan Note (Signed)
Well controlled on triple combo pill. No complaints of chest pain, dizziness, headache or shortness of breath. Plan to continue current regimen. Normal renal function 02/2022.

## 2022-09-08 NOTE — Assessment & Plan Note (Signed)
Pt states the pain is doing well. Robaxin, tylenol, lidocaine patches and thermal therapy work and gabapentin, lyrica and duloxetine does not work for her due to side effects. Discussed restarting low dose oxycodone for as needed pain but pt stated she does not like strong medications like that. She still has some functional limitation from the back pain. Discussed referral to physical therapy and pt is very receptive. Referral placed.

## 2022-09-09 ENCOUNTER — Telehealth: Payer: Self-pay

## 2022-09-09 LAB — VITAMIN D 25 HYDROXY (VIT D DEFICIENCY, FRACTURES): Vit D, 25-Hydroxy: 76.8 ng/mL (ref 30.0–100.0)

## 2022-09-09 NOTE — Progress Notes (Signed)
Internal Medicine Clinic Attending  Case discussed with the resident at the time of the visit.  We reviewed the resident's history and exam and pertinent patient test results.  I agree with the assessment, diagnosis, and plan of care documented in the resident's note.  

## 2022-09-09 NOTE — Telephone Encounter (Signed)
Requesting lab results, please call pt back.  

## 2022-09-23 ENCOUNTER — Other Ambulatory Visit: Payer: Self-pay | Admitting: Internal Medicine

## 2022-09-23 DIAGNOSIS — E559 Vitamin D deficiency, unspecified: Secondary | ICD-10-CM

## 2022-10-01 ENCOUNTER — Other Ambulatory Visit: Payer: Self-pay

## 2022-10-01 ENCOUNTER — Ambulatory Visit: Payer: Medicaid Other | Attending: Internal Medicine

## 2022-10-01 DIAGNOSIS — G8929 Other chronic pain: Secondary | ICD-10-CM | POA: Insufficient documentation

## 2022-10-01 DIAGNOSIS — M5440 Lumbago with sciatica, unspecified side: Secondary | ICD-10-CM | POA: Insufficient documentation

## 2022-10-01 DIAGNOSIS — R2689 Other abnormalities of gait and mobility: Secondary | ICD-10-CM | POA: Diagnosis present

## 2022-10-01 DIAGNOSIS — M5459 Other low back pain: Secondary | ICD-10-CM | POA: Diagnosis present

## 2022-10-01 DIAGNOSIS — M6281 Muscle weakness (generalized): Secondary | ICD-10-CM | POA: Diagnosis present

## 2022-10-01 NOTE — Therapy (Signed)
OUTPATIENT PHYSICAL THERAPY THORACOLUMBAR EVALUATION   Patient Name: Sandra Church MRN: [id number] DOB:12/24/1960, 62 y.o., female Today's Date: 10/01/2022  END OF SESSION:  PT End of Session - 10/01/22 0943     Visit Number 1    Number of Visits 17    Date for PT Re-Evaluation 11/26/22    Authorization Type [employer] MCD    PT Start Time 0846    PT Stop Time 0929    PT Time Calculation (min) 43 min    Activity Tolerance Patient tolerated treatment well;Patient limited by pain    Behavior During Therapy [facility] for tasks assessed/performed             Past Medical History:  Diagnosis Date   Anemia    Anxiety    Arthritis    Atrophic endometrium 12/18/2016   Atrophic endometrium 12/18/2016   Back pain with right-sided radiculopathy 03/11/2017   Follows with Dr. Inda Merlin and receives occasional injections. States she was told by Dr. Venetia Maxon that she does not qualify for surgery    Constipation 07/22/2018   Tommi Rumps Quervain's disease (tenosynovitis) 01/17/2019   Fibromyalgia    Functional incontinence 10/07/2019   GERD (gastroesophageal reflux disease)    Heart murmur    yrs ago, heart murmur gone now   History of kidney stones    Hyperlipidemia    Hypertension    Jaundice    AGE 47 OR 21, food poisioning   Medial epicondylitis of elbow, left 03/13/2020   Metatarsal deformity 02/01/2014   MVP (mitral valve prolapse)    Piriformis syndrome of right side 01/09/2017   Postmenopausal bleeding 12/31/2016   Pronation deformity of ankle, acquired 02/01/2014   Stable angina    per pt no chest pain since 2017   Status post right foot surgery 04/12/2014   Superficial burn 07/08/2019   Urinary incontinence 10/13/2018   Wears glasses    Wears partial dentures    UPPER   Past Surgical History:  Procedure Laterality Date   CLOSED MANIPULATION KNEE WITH STERIOD INJECTION  2011   rt   COLONOSCOPY  AT AGE 43-normal exam   [place]   [place] Osteotomy with Bone Graft Right 02/23/2014    Rt foot @ [facility]   DIAGNOSTIC LAPAROSCOPY     FOOT BONE EXCISION     both feet-spurs   FOOT SURGERY Bilateral    plantar fascitis   HYSTEROSCOPY WITH D & C N/A 12/18/2016   Procedure: DILATATION AND CURETTAGE /HYSTEROSCOPY;  Surgeon: Sherian Rein, MD;  Location: [facility];  Service: Gynecology;  Laterality: N/A;   JOINT REPLACEMENT  2011   rt total knee replacement   KNEE ARTHROPLASTY  2012   rt   MINOR RELEASE DORSAL COMPARTMENT (DEQUERVAINS) Left 04/19/2019   Procedure: MINOR RELEASE DORSAL COMPARTMENT (DEQUERVAINS);  Surgeon: Sheral Apley, MD;  Location[facility];  Service: Orthopedics;  Laterality: Left;   OPEN REDUCTION INTERNAL FIXATION (ORIF) METACARPAL Left 07/06/2012   Procedure: LEFT HAND EXPLORATION WOUNDS, OPEN REDUCTION INTERNAL FIXATION SMALL METACARPAL FRACTURE;  Surgeon: Tami Ribas, MD;  Location: [facility];  Service: Orthopedics;  Laterality: Left;   REPAIR EXTENSOR TENDON Left 07/06/2012   Procedure: REPAIR EXTENSOR TENDON;  Surgeon: Tami Ribas, MD;  Location: [facility];  Service: Orthopedics;  Laterality: Left;  Left    SHOULDER ARTHROSCOPY WITH DISTAL CLAVICLE RESECTION Left 01/24/2016   Procedure: SHOULDER ARTHROSCOPY WITH DISTAL CLAVICLE RESECTION;  Surgeon: Loreta Ave, MD;  Location: [facility];  Service: Orthopedics;  Laterality: Left;   SHOULDER ARTHROSCOPY WITH ROTATOR CUFF REPAIR AND SUBACROMIAL DECOMPRESSION Left 01/24/2016   Procedure: LEFT SHOULDER ARTHROSCOPY ACROMIOPLASTY, CLAVICLECTOMY, ARTHROSCOPIC ROTATOR CUFF REPAIR;  Surgeon: Loreta Ave, MD;  Location: [facility];  Service: Orthopedics;  Laterality: Left;   SHOULDER SURGERY  2010   rt   Patient Active Problem List   Diagnosis Date Noted   Gastritis 10/23/2020   Vitamin D deficiency 09/23/2020   Hyperlipidemia 03/13/2020   Chronic low back pain with sciatica 10/07/2019   Plantar  fasciitis, bilateral 07/22/2018   Hypertension 10/08/2016   Fibromyalgia 10/08/2016   Osteoarthritis 10/08/2016   GERD (gastroesophageal reflux disease) 10/08/2016   Preventative health care 10/08/2016    PCP: Gwenevere Abbot, MD  REFERRING PROVIDER: Reymundo Poll, MD   REFERRING DIAG: [phone] (ICD-10-CM) - Chronic low back pain with sciatica, sciatica laterality unspecified, unspecified back pain laterality   Rationale for Evaluation and Treatment: Rehabilitation  THERAPY DIAG:  Other low back pain  Muscle weakness (generalized)  Other abnormalities of gait and mobility  ONSET DATE: Chronic  SUBJECTIVE:                                                                                                                                                                                           SUBJECTIVE STATEMENT: Pt presents to PT with reports of chronic lower back pain with referral into bilateral LE, L>R. Notes tingling sensation as well as pain in L LE down to foot. Denies bowel/bladder changes or saddle anesthesia. Most motions tend to increase her pain, she enjoys gardening and fishing and is saddened by her inability to do so.   PERTINENT HISTORY:  HTN, Fibromyalgia, R TKA  PAIN:  Are you having pain?  Yes: NPRS scale: 7/10 Worst: 10/10 Pain location: lower back, BIL LE (L>R) Pain description: sharp pain, tingling Aggravating factors:  Relieving factors: heat, ice, rest  PRECAUTIONS: None  RED FLAGS: None   WEIGHT BEARING RESTRICTIONS: No  FALLS:  Has patient fallen in last 6 months? No  LIVING ENVIRONMENT: Lives with: lives alone Lives in: House/apartment Stairs: Has stairs - 4 STE with bilateral handrails; uses ramp entrance as well Has following equipment at home: Single point cane, Walker - 2 wheeled, shower chair, and Grab bars  OCCUPATION: Not working  PLOF: Independent  PATIENT GOALS: decrease back pain in order to get back to fishing and  gardening with improved comfort  OBJECTIVE:   DIAGNOSTIC FINDINGS:  See imaging for past lumbar MRI  PATIENT SURVEYS:  FOTO: 35% function; 44% predicted  COGNITION: Overall cognitive  status: Within functional limits for tasks assessed     SENSATION: Light touch: Impaired   MUSCLE LENGTH: Hamstrings: DNT Thomas test: Right (+); Left (+)  POSTURE: rounded shoulders, forward head, and increased lumbar lordosis  PALPATION: TTP to L gluteals, L lumbar paraspinals  LOWER EXTREMITY MMT:    MMT Right eval Left eval  Hip flexion 3+/5 3/5  Hip extension    Hip abduction 3/5 3+/5  Hip adduction    Hip internal rotation    Hip external rotation    Knee flexion 4/5 3+/5  Knee extension 4/5 4/5  Ankle dorsiflexion    Ankle plantarflexion    Ankle inversion    Ankle eversion     (Blank rows = not tested)  LUMBAR SPECIAL TESTS:  Straight leg raise test: Positive and Slump test: Positive  FUNCTIONAL TESTS:  30 Second Sit to Stand: 3 reps with UE  GAIT: Distance walked: 73ft Assistive device utilized: Single point cane Level of assistance: SBA Comments: antalgic gait L, trunk lean L  TREATMENT: [facility] Adult PT Treatment:                                                DATE: 10/01/2022 Therapeutic Exercise: Seated sciatic nerve glide x 10 L Supine PPT x 5 - 5" hold Supine clamshell x 10 RTB LTR x 5 each  PATIENT EDUCATION:  Education details: eval findings, FOTO, HEP, POC Person educated: Patient Education method: Explanation, Demonstration, and Handouts Education comprehension: verbalized understanding and returned demonstration  HOME EXERCISE PROGRAM: Access Code: [email or web address] URL: [email or web address] Date: 10/01/2022 Prepared by: Edwinna Areola  Exercises - Seated Sciatic Tensioner  - 1-2 x daily - 7 x weekly - 3 sets - 10 reps - Supine Posterior Pelvic Tilt  - 1-2 x daily - 7 x weekly - 2 sets - 10 reps - 5 sec hold - Hooklying Clamshell with  Resistance  - 1-2 x daily - 7 x weekly - 3 sets - 10 reps - red band hold - Supine Lower Trunk Rotation  - 1-2 x daily - 7 x weekly - 2 sets - 10 reps - 5 sec hold  ASSESSMENT:  CLINICAL IMPRESSION: Patient is a 62 y.o. F who was seen today for physical therapy evaluation and treatment for chronic LBP and discomfort. Physical findings are consistent with MD impression as pt demonstrates decrease in core and proximal hip muscle strength. FOTO score shows decrease in subjective functional ability below PLOF. Pt would benefit from skilled PT services working on improving strength and decreasing pain.  OBJECTIVE IMPAIRMENTS: Abnormal gait, decreased activity tolerance, decreased balance, decreased mobility, difficulty walking, decreased ROM, decreased strength, postural dysfunction, and pain  ACTIVITY LIMITATIONS: carrying, lifting, sitting, standing, squatting, stairs, transfers, bed mobility, and locomotion level  PARTICIPATION LIMITATIONS: meal prep, cleaning, laundry, driving, shopping, community activity, occupation, and yard work  PERSONAL FACTORS: Fitness, Time since onset of injury/illness/exacerbation, and 1-2 comorbidities: HTN, Fibromyalgia, R TKA  are also affecting patient's functional outcome.   REHAB POTENTIAL: Good  CLINICAL DECISION MAKING: Evolving/moderate complexity  EVALUATION COMPLEXITY: Moderate   GOALS: Goals reviewed with patient? No  SHORT TERM GOALS: Target date: 10/22/2022   Pt will be compliant and knowledgeable with initial HEP for improved comfort and carryover Baseline: initial HEP given  Goal status: INITIAL  2.  Pt will self report lower back and LE  pain no greater than 7/10 for improved comfort and functional ability Baseline: 10/10 at worst Goal status: INITIAL   LONG TERM GOALS: Target date: 11/26/2022   Pt will improve FOTO function score to no less than 44% as proxy for functional improvement Baseline: 35% function Goal status: INITIAL   2.   Pt will self report lower back and LE pain no greater than 3/10 for improved comfort and functional ability Baseline: 10/10 at worst Goal status: INITIAL   3.  Pt will increase 30 Second Sit to Stand rep count to no less than 6 reps for improved balance, strength, and functional mobility Baseline: 3 reps with UE Goal status: INITIAL   4.  Pt will improve LE MMT to no less than 4/5 for all tested motions for improved comfort and decreased LBP Baseline: see MMT chart Goal status: INITIAL  5.  Pt will be able to get back to gardening and fishing not limited by pain for improved comfort and quality of life with desired recreational activity Baseline: unable Goal status: INITIAL   PLAN:  PT FREQUENCY: 1-2x/week  PT DURATION: 8 weeks  PLANNED INTERVENTIONS: Therapeutic exercises, Therapeutic activity, Neuromuscular re-education, Balance training, Gait training, Patient/Family education, Self Care, Joint mobilization, Aquatic Therapy, Dry Needling, Electrical stimulation, Cryotherapy, Moist heat, Vasopneumatic device, Manual therapy, and Re-evaluation.  PLAN FOR NEXT SESSION: core/hip strengthening, aquatic strengthening, TPDN    Eloy End, PT 10/01/2022, 9:45 AM

## 2022-10-13 ENCOUNTER — Other Ambulatory Visit: Payer: Self-pay

## 2022-10-13 MED ORDER — ATORVASTATIN CALCIUM 20 MG PO TABS: 20.0000 mg | ORAL_TABLET | Freq: Every day | ORAL | 3 refills | Status: DC

## 2022-10-16 ENCOUNTER — Ambulatory Visit: Payer: Medicaid Other | Attending: Internal Medicine

## 2022-10-16 DIAGNOSIS — R2689 Other abnormalities of gait and mobility: Secondary | ICD-10-CM | POA: Diagnosis present

## 2022-10-16 DIAGNOSIS — M5459 Other low back pain: Secondary | ICD-10-CM | POA: Diagnosis present

## 2022-10-16 DIAGNOSIS — M6281 Muscle weakness (generalized): Secondary | ICD-10-CM | POA: Diagnosis present

## 2022-10-16 NOTE — Therapy (Signed)
OUTPATIENT PHYSICAL THERAPY TREATMENT   Patient Name: SHAQUASHA GERSTEL MRN: [id number] DOB:Aug 01, 1960, 62 y.o., female Today's Date: 10/16/2022  END OF SESSION:  PT End of Session - 10/16/22 0929     Visit Number 2    Number of Visits 17    Date for PT Re-Evaluation 11/26/22    Authorization Type [employer] MCD    PT Start Time 0930    PT Stop Time 1008    PT Time Calculation (min) 38 min    Activity Tolerance Patient tolerated treatment well;Patient limited by pain    Behavior During Therapy [facility] for tasks assessed/performed              Past Medical History:  Diagnosis Date   Anemia    Anxiety    Arthritis    Atrophic endometrium 12/18/2016   Atrophic endometrium 12/18/2016   Back pain with right-sided radiculopathy 03/11/2017   Follows with Dr. Inda Merlin and receives occasional injections. States she was told by Dr. Venetia Maxon that she does not qualify for surgery    Constipation 07/22/2018   Tommi Rumps Quervain's disease (tenosynovitis) 01/17/2019   Fibromyalgia    Functional incontinence 10/07/2019   GERD (gastroesophageal reflux disease)    Heart murmur    yrs ago, heart murmur gone now   History of kidney stones    Hyperlipidemia    Hypertension    Jaundice    AGE 55 OR 21, food poisioning   Medial epicondylitis of elbow, left 03/13/2020   Metatarsal deformity 02/01/2014   MVP (mitral valve prolapse)    Piriformis syndrome of right side 01/09/2017   Postmenopausal bleeding 12/31/2016   Pronation deformity of ankle, acquired 02/01/2014   Stable angina (HCC)    per pt no chest pain since 2017   Status post right foot surgery 04/12/2014   Superficial burn 07/08/2019   Urinary incontinence 10/13/2018   Wears glasses    Wears partial dentures    UPPER   Past Surgical History:  Procedure Laterality Date   CLOSED MANIPULATION KNEE WITH STERIOD INJECTION  2011   rt   COLONOSCOPY  AT AGE 11-normal exam   [place]   Cotton Osteotomy with Bone Graft Right 02/23/2014   Rt  foot @ [facility]   DIAGNOSTIC LAPAROSCOPY     FOOT BONE EXCISION     both feet-spurs   FOOT SURGERY Bilateral    plantar fascitis   HYSTEROSCOPY WITH D & C N/A 12/18/2016   Procedure: DILATATION AND CURETTAGE /HYSTEROSCOPY;  Surgeon: Sherian Rein, MD;  Location: [facility];  Service: Gynecology;  Laterality: N/A;   JOINT REPLACEMENT  2011   rt total knee replacement   KNEE ARTHROPLASTY  2012   rt   MINOR RELEASE DORSAL COMPARTMENT (DEQUERVAINS) Left 04/19/2019   Procedure: MINOR RELEASE DORSAL COMPARTMENT (DEQUERVAINS);  Surgeon: Sheral Apley, MD;  Location: [facility];  Service: Orthopedics;  Laterality: Left;   OPEN REDUCTION INTERNAL FIXATION (ORIF) METACARPAL Left 07/06/2012   Procedure: LEFT HAND EXPLORATION WOUNDS, OPEN REDUCTION INTERNAL FIXATION SMALL METACARPAL FRACTURE;  Surgeon: Tami Ribas, MD;  Location: [facility];  Service: Orthopedics;  Laterality: Left;   REPAIR EXTENSOR TENDON Left 07/06/2012   Procedure: REPAIR EXTENSOR TENDON;  Surgeon: Tami Ribas, MD;  Location: [facility];  Service: Orthopedics;  Laterality: Left;  Left    SHOULDER ARTHROSCOPY WITH DISTAL CLAVICLE RESECTION Left 01/24/2016   Procedure: SHOULDER ARTHROSCOPY WITH DISTAL CLAVICLE RESECTION;  Surgeon: Loreta Ave,  MD;  Location: [facility];  Service: Orthopedics;  Laterality: Left;   SHOULDER ARTHROSCOPY WITH ROTATOR CUFF REPAIR AND SUBACROMIAL DECOMPRESSION Left 01/24/2016   Procedure: LEFT SHOULDER ARTHROSCOPY ACROMIOPLASTY, CLAVICLECTOMY, ARTHROSCOPIC ROTATOR CUFF REPAIR;  Surgeon: Loreta Ave, MD;  Location: [facility];  Service: Orthopedics;  Laterality: Left;   SHOULDER SURGERY  2010   rt   Patient Active Problem List   Diagnosis Date Noted   Gastritis 10/23/2020   Vitamin D deficiency 09/23/2020   Hyperlipidemia 03/13/2020   Chronic low back pain with sciatica 10/07/2019   Plantar  fasciitis, bilateral 07/22/2018   Hypertension 10/08/2016   Fibromyalgia 10/08/2016   Osteoarthritis 10/08/2016   GERD (gastroesophageal reflux disease) 10/08/2016   Preventative health care 10/08/2016    PCP: Gwenevere Abbot, MD  REFERRING PROVIDER: Reymundo Poll, MD   REFERRING DIAG: [phone] (ICD-10-CM) - Chronic low back pain with sciatica, sciatica laterality unspecified, unspecified back pain laterality   Rationale for Evaluation and Treatment: Rehabilitation  THERAPY DIAG:  Other low back pain  Muscle weakness (generalized)  ONSET DATE: Chronic  SUBJECTIVE:                                                                                                                                                                                           SUBJECTIVE STATEMENT: Pt presents to PT with reports of continued pain and discomfort in lower back and L leg. Has been compliant with HEP.   PERTINENT HISTORY:  HTN, Fibromyalgia, R TKA  PAIN:  Are you having pain?  Yes: NPRS scale: 7/10 Worst: 10/10 Pain location: lower back, BIL LE (L>R) Pain description: sharp pain, tingling Aggravating factors:  Relieving factors: heat, ice, rest  PRECAUTIONS: None  RED FLAGS: None   WEIGHT BEARING RESTRICTIONS: No  FALLS:  Has patient fallen in last 6 months? No  LIVING ENVIRONMENT: Lives with: lives alone Lives in: House/apartment Stairs: Has stairs - 4 STE with bilateral handrails; uses ramp entrance as well Has following equipment at home: Single point cane, Walker - 2 wheeled, shower chair, and Grab bars  OCCUPATION: Not working  PLOF: Independent  PATIENT GOALS: decrease back pain in order to get back to fishing and gardening with improved comfort  OBJECTIVE:   DIAGNOSTIC FINDINGS:  See imaging for past lumbar MRI  PATIENT SURVEYS:  FOTO: 35% function; 44% predicted  COGNITION: Overall cognitive status: Within functional limits for tasks  assessed     SENSATION: Light touch: Impaired   MUSCLE LENGTH: Hamstrings: DNT Thomas test: Right (+); Left (+)  POSTURE: rounded shoulders, forward head, and increased lumbar lordosis  PALPATION: TTP  to L gluteals, L lumbar paraspinals  LOWER EXTREMITY MMT:    MMT Right eval Left eval  Hip flexion 3+/5 3/5  Hip extension    Hip abduction 3/5 3+/5  Hip adduction    Hip internal rotation    Hip external rotation    Knee flexion 4/5 3+/5  Knee extension 4/5 4/5  Ankle dorsiflexion    Ankle plantarflexion    Ankle inversion    Ankle eversion     (Blank rows = not tested)  LUMBAR SPECIAL TESTS:  Straight leg raise test: Positive and Slump test: Positive  FUNCTIONAL TESTS:  30 Second Sit to Stand: 3 reps with UE  GAIT: Distance walked: 40ft Assistive device utilized: Single point cane Level of assistance: SBA Comments: antalgic gait L, trunk lean L  TREATMENT: [facility] Adult PT Treatment:                                                DATE: 10/16/2022 Therapeutic Exercise: NuStep lvl 3 UE/LE x 5 min while taking subjective Seated hamstring stretch x 30" each Supine PPT x 10 - 5" hold Supine PPT with ball 2x10  Supine SLR (small range) x 10 each LTR x 5 each Seated clamshell 2x15 GTB STS x 10 - high table no UE  [facility] Adult PT Treatment:                                                DATE: 10/01/2022 Therapeutic Exercise: Seated sciatic nerve glide x 10 L Supine PPT x 5 - 5" hold Supine clamshell x 10 RTB LTR x 5 each  PATIENT EDUCATION:  Education details: eval findings, FOTO, HEP, POC Person educated: Patient Education method: Explanation, Demonstration, and Handouts Education comprehension: verbalized understanding and returned demonstration  HOME EXERCISE PROGRAM: Access Code: [email or web address] URL: [email or web address] Date: 10/16/2022 Prepared by: Edwinna Areola  Exercises - Seated Sciatic Tensioner  - 1-2 x daily - 7 x weekly - 3 sets - 10  reps - Supine Posterior Pelvic Tilt  - 1-2 x daily - 7 x weekly - 2 sets - 10 reps - 5 sec hold - Hooklying Clamshell with Resistance  - 1-2 x daily - 7 x weekly - 3 sets - 10 reps - red band hold - Supine Lower Trunk Rotation  - 1-2 x daily - 7 x weekly - 2 sets - 10 reps - 5 sec hold - Small Range Straight Leg Raise  - 1-2 x daily - 7 x weekly - 2 sets - 10 reps  ASSESSMENT:  CLINICAL IMPRESSION: Pt was able to complete prescribed exercises, continues to be limited by pain and discomfort. Therapy today continued to focus on core and proximal hip strengthening. HEP updated for continued hip strengthening at home. Will continue to progress as able per POC.   OBJECTIVE IMPAIRMENTS: Abnormal gait, decreased activity tolerance, decreased balance, decreased mobility, difficulty walking, decreased ROM, decreased strength, postural dysfunction, and pain  ACTIVITY LIMITATIONS: carrying, lifting, sitting, standing, squatting, stairs, transfers, bed mobility, and locomotion level  PARTICIPATION LIMITATIONS: meal prep, cleaning, laundry, driving, shopping, community activity, occupation, and yard work  PERSONAL FACTORS: Fitness, Time since onset of injury/illness/exacerbation, and 1-2 comorbidities: HTN, Fibromyalgia, R TKA  are also  affecting patient's functional outcome.   REHAB POTENTIAL: Good  CLINICAL DECISION MAKING: Evolving/moderate complexity  EVALUATION COMPLEXITY: Moderate   GOALS: Goals reviewed with patient? No  SHORT TERM GOALS: Target date: 10/22/2022   Pt will be compliant and knowledgeable with initial HEP for improved comfort and carryover Baseline: initial HEP given  Goal status: INITIAL  2.  Pt will self report lower back and LE pain no greater than 7/10 for improved comfort and functional ability Baseline: 10/10 at worst Goal status: INITIAL   LONG TERM GOALS: Target date: 11/26/2022   Pt will improve FOTO function score to no less than 44% as proxy for functional  improvement Baseline: 35% function Goal status: INITIAL   2.  Pt will self report lower back and LE pain no greater than 3/10 for improved comfort and functional ability Baseline: 10/10 at worst Goal status: INITIAL   3.  Pt will increase 30 Second Sit to Stand rep count to no less than 6 reps for improved balance, strength, and functional mobility Baseline: 3 reps with UE Goal status: INITIAL   4.  Pt will improve LE MMT to no less than 4/5 for all tested motions for improved comfort and decreased LBP Baseline: see MMT chart Goal status: INITIAL  5.  Pt will be able to get back to gardening and fishing not limited by pain for improved comfort and quality of life with desired recreational activity Baseline: unable Goal status: INITIAL   PLAN:  PT FREQUENCY: 1-2x/week  PT DURATION: 8 weeks  PLANNED INTERVENTIONS: Therapeutic exercises, Therapeutic activity, Neuromuscular re-education, Balance training, Gait training, Patient/Family education, Self Care, Joint mobilization, Aquatic Therapy, Dry Needling, Electrical stimulation, Cryotherapy, Moist heat, Vasopneumatic device, Manual therapy, and Re-evaluation.  PLAN FOR NEXT SESSION: core/hip strengthening, aquatic strengthening, TPDN    Eloy End, PT 10/16/2022, 10:10 AM

## 2022-10-21 ENCOUNTER — Telehealth: Payer: Self-pay

## 2022-10-21 ENCOUNTER — Ambulatory Visit: Payer: Medicaid Other

## 2022-10-21 NOTE — Telephone Encounter (Signed)
PT called and spoke with patient regarding missed appointment. Patient forgot about this mornings visit.  Confirmed next visit and reminded of attendance policy.   Eloy End   10/21/22 10:33 AM

## 2022-10-23 ENCOUNTER — Ambulatory Visit: Payer: Medicaid Other

## 2022-10-23 DIAGNOSIS — M5459 Other low back pain: Secondary | ICD-10-CM

## 2022-10-23 DIAGNOSIS — R2689 Other abnormalities of gait and mobility: Secondary | ICD-10-CM

## 2022-10-23 DIAGNOSIS — M6281 Muscle weakness (generalized): Secondary | ICD-10-CM

## 2022-10-23 NOTE — Therapy (Signed)
OUTPATIENT PHYSICAL THERAPY TREATMENT   Patient Name: Sandra Church MRN: [id number] DOB:[birth year], 62 y.o., female Today's Date: 10/23/2022  END OF SESSION:  PT End of Session - 10/23/22 2536     Visit Number 3    Number of Visits 17    Date for PT Re-Evaluation 11/26/22    Authorization Type [employer] MCD    PT Start Time 0930    PT Stop Time 1010    PT Time Calculation (min) 40 min    Activity Tolerance Patient tolerated treatment well;Patient limited by pain    Behavior During Therapy [facility] for tasks assessed/performed               Past Medical History:  Diagnosis Date   Anemia    Anxiety    Arthritis    Atrophic endometrium 12/18/2016   Atrophic endometrium 12/18/2016   Back pain with right-sided radiculopathy 03/11/2017   Follows with Dr. Inda Merlin and receives occasional injections. States she was told by Dr. Venetia Maxon that she does not qualify for surgery    Constipation 07/22/2018   Tommi Rumps Quervain's disease (tenosynovitis) 01/17/2019   Fibromyalgia    Functional incontinence 10/07/2019   GERD (gastroesophageal reflux disease)    Heart murmur    yrs ago, heart murmur gone now   History of kidney stones    Hyperlipidemia    Hypertension    Jaundice    AGE 1 OR 21, food poisioning   Medial epicondylitis of elbow, left 03/13/2020   Metatarsal deformity 02/01/2014   MVP (mitral valve prolapse)    Piriformis syndrome of right side 01/09/2017   Postmenopausal bleeding 12/31/2016   Pronation deformity of ankle, acquired 02/01/2014   Stable angina (HCC)    per pt no chest pain since 2017   Status post right foot surgery 04/12/2014   Superficial burn 07/08/2019   Urinary incontinence 10/13/2018   Wears glasses    Wears partial dentures    UPPER   Past Surgical History:  Procedure Laterality Date   CLOSED MANIPULATION KNEE WITH STERIOD INJECTION  2011   rt   COLONOSCOPY  AT AGE 54-normal exam   [place]   [place] Osteotomy with Bone Graft Right 02/23/2014    Rt foot @ [facility]   DIAGNOSTIC LAPAROSCOPY     FOOT BONE EXCISION     both feet-spurs   FOOT SURGERY Bilateral    plantar fascitis   HYSTEROSCOPY WITH D & C N/A 12/18/2016   Procedure: DILATATION AND CURETTAGE /HYSTEROSCOPY;  Surgeon: Sherian Rein, MD;  Location: [facility];  Service: Gynecology;  Laterality: N/A;   JOINT REPLACEMENT  2011   rt total knee replacement   KNEE ARTHROPLASTY  2012   rt   MINOR RELEASE DORSAL COMPARTMENT (DEQUERVAINS) Left 04/19/2019   Procedure: MINOR RELEASE DORSAL COMPARTMENT (DEQUERVAINS);  Surgeon: Sheral Apley, MD;  Location: [facility];  Service: Orthopedics;  Laterality: Left;   OPEN REDUCTION INTERNAL FIXATION (ORIF) METACARPAL Left 07/06/2012   Procedure: LEFT HAND EXPLORATION WOUNDS, OPEN REDUCTION INTERNAL FIXATION SMALL METACARPAL FRACTURE;  Surgeon: Tami Ribas, MD;  Location: [facility];  Service: Orthopedics;  Laterality: Left;   REPAIR EXTENSOR TENDON Left 07/06/2012   Procedure: REPAIR EXTENSOR TENDON;  Surgeon: Tami Ribas, MD;  Location: [facility];  Service: Orthopedics;  Laterality: Left;  Left    SHOULDER ARTHROSCOPY WITH DISTAL CLAVICLE RESECTION Left 01/24/2016   Procedure: SHOULDER ARTHROSCOPY WITH DISTAL CLAVICLE RESECTION;  Surgeon: Margarette Asal  Eulah Pont, MD;  Location: [facility];  Service: Orthopedics;  Laterality: Left;   SHOULDER ARTHROSCOPY WITH ROTATOR CUFF REPAIR AND SUBACROMIAL DECOMPRESSION Left 01/24/2016   Procedure: LEFT SHOULDER ARTHROSCOPY ACROMIOPLASTY, CLAVICLECTOMY, ARTHROSCOPIC ROTATOR CUFF REPAIR;  Surgeon: Loreta Ave, MD;  Location: [facility];  Service: Orthopedics;  Laterality: Left;   SHOULDER SURGERY  2010   rt   Patient Active Problem List   Diagnosis Date Noted   Gastritis 10/23/2020   Vitamin D deficiency 09/23/2020   Hyperlipidemia 03/13/2020   Chronic low back pain with sciatica 10/07/2019   Plantar  fasciitis, bilateral 07/22/2018   Hypertension 10/08/2016   Fibromyalgia 10/08/2016   Osteoarthritis 10/08/2016   GERD (gastroesophageal reflux disease) 10/08/2016   Preventative health care 10/08/2016    PCP: Gwenevere Abbot, MD  REFERRING PROVIDER: Reymundo Poll, MD   REFERRING DIAG: [phone] (ICD-10-CM) - Chronic low back pain with sciatica, sciatica laterality unspecified, unspecified back pain laterality   Rationale for Evaluation and Treatment: Rehabilitation  THERAPY DIAG:  Other low back pain  Muscle weakness (generalized)  Other abnormalities of gait and mobility  ONSET DATE: Chronic  SUBJECTIVE:                                                                                                                                                                                           SUBJECTIVE STATEMENT: Pt presents to PT with reports of decreased pain and discomfort in lower back and L leg. Has been compliant with HEP.   PERTINENT HISTORY:  HTN, Fibromyalgia, R TKA  PAIN:  Are you having pain?  Yes: NPRS scale: 7/10 Worst: 10/10 Pain location: lower back, BIL LE (L>R) Pain description: sharp pain, tingling Aggravating factors:  Relieving factors: heat, ice, rest  PRECAUTIONS: None  RED FLAGS: None   WEIGHT BEARING RESTRICTIONS: No  FALLS:  Has patient fallen in last 6 months? No  LIVING ENVIRONMENT: Lives with: lives alone Lives in: House/apartment Stairs: Has stairs - 4 STE with bilateral handrails; uses ramp entrance as well Has following equipment at home: Single point cane, Walker - 2 wheeled, shower chair, and Grab bars  OCCUPATION: Not working  PLOF: Independent  PATIENT GOALS: decrease back pain in order to get back to fishing and gardening with improved comfort  OBJECTIVE:   DIAGNOSTIC FINDINGS:  See imaging for past lumbar MRI  PATIENT SURVEYS:  FOTO: 35% function; 44% predicted  COGNITION: Overall cognitive status: Within  functional limits for tasks assessed     SENSATION: Light touch: Impaired   MUSCLE LENGTH: Hamstrings: DNT Thomas test: Right (+); Left (+)  POSTURE: rounded shoulders, forward  head, and increased lumbar lordosis  PALPATION: TTP to L gluteals, L lumbar paraspinals  LOWER EXTREMITY MMT:    MMT Right eval Left eval  Hip flexion 3+/5 3/5  Hip extension    Hip abduction 3/5 3+/5  Hip adduction    Hip internal rotation    Hip external rotation    Knee flexion 4/5 3+/5  Knee extension 4/5 4/5  Ankle dorsiflexion    Ankle plantarflexion    Ankle inversion    Ankle eversion     (Blank rows = not tested)  LUMBAR SPECIAL TESTS:  Straight leg raise test: Positive and Slump test: Positive  FUNCTIONAL TESTS:  30 Second Sit to Stand: 3 reps with UE  GAIT: Distance walked: 22ft Assistive device utilized: Single point cane Level of assistance: SBA Comments: antalgic gait L, trunk lean L  TREATMENT: [facility] Adult PT Treatment:                                                DATE: 10/23/2022 Therapeutic Exercise: NuStep lvl 3 UE/LE x 5 min while taking subjective LTR x 5 each - 5" hold Supine PPT x 10 - 5" hold Supine PPT with ball 2x10 Supine SLR (small range) x 10 each Supine clamshell 2x15 GTB LAQ 2x10 2.5# STS x 10 - high table no UE Modalities: MHP to lumbar paraspinals during supine exercises  [facility] Adult PT Treatment:                                                DATE: 10/16/2022 Therapeutic Exercise: NuStep lvl 3 UE/LE x 5 min while taking subjective Seated hamstring stretch x 30" each Supine PPT x 10 - 5" hold Supine PPT with ball 2x10  Supine SLR (small range) x 10 each LTR x 5 each Seated clamshell 2x15 GTB Seated hamstring stretch x 30" each STS x 10 - high table no UE  [facility] Adult PT Treatment:                                                DATE: 10/01/2022 Therapeutic Exercise: Seated sciatic nerve glide x 10 L Supine PPT x 5 - 5" hold Supine clamshell  x 10 RTB LTR x 5 each  PATIENT EDUCATION:  Education details: eval findings, FOTO, HEP, POC Person educated: Patient Education method: Explanation, Demonstration, and Handouts Education comprehension: verbalized understanding and returned demonstration  HOME EXERCISE PROGRAM: Access Code: [email or web address] URL: [email or web address] Date: 10/23/2022 Prepared by: Edwinna Areola  Exercises - Seated Sciatic Tensioner  - 1-2 x daily - 7 x weekly - 3 sets - 10 reps - Supine Posterior Pelvic Tilt  - 1-2 x daily - 7 x weekly - 2 sets - 10 reps - 5 sec hold - Hooklying Clamshell with Resistance  - 1-2 x daily - 7 x weekly - 3 sets - 10 reps - red band hold - Supine Lower Trunk Rotation  - 1-2 x daily - 7 x weekly - 2 sets - 10 reps - 5 sec hold - Small Range Straight Leg Raise  - 1-2 x daily -  7 x weekly - 2 sets - 10 reps - Long Sitting Ankle Eversion with Resistance  - 1 x daily - 7 x weekly - 3 sets - 10 reps - red band hold - Towel Scrunches  - 1 x daily - 7 x weekly - 2-3 reps - 1-2 min hold  ASSESSMENT:  CLINICAL IMPRESSION: Pt was able to complete prescribed exercises, continues to be limited by pain and weakness. Therapy today continued to focus on core and proximal hip strengthening. Will continue to progress as able per POC.   OBJECTIVE IMPAIRMENTS: Abnormal gait, decreased activity tolerance, decreased balance, decreased mobility, difficulty walking, decreased ROM, decreased strength, postural dysfunction, and pain  ACTIVITY LIMITATIONS: carrying, lifting, sitting, standing, squatting, stairs, transfers, bed mobility, and locomotion level  PARTICIPATION LIMITATIONS: meal prep, cleaning, laundry, driving, shopping, community activity, occupation, and yard work  PERSONAL FACTORS: Fitness, Time since onset of injury/illness/exacerbation, and 1-2 comorbidities: HTN, Fibromyalgia, R TKA  are also affecting patient's functional outcome.   REHAB POTENTIAL: Good  CLINICAL  DECISION MAKING: Evolving/moderate complexity  EVALUATION COMPLEXITY: Moderate   GOALS: Goals reviewed with patient? No  SHORT TERM GOALS: Target date: 10/22/2022   Pt will be compliant and knowledgeable with initial HEP for improved comfort and carryover Baseline: initial HEP given  Goal status: INITIAL  2.  Pt will self report lower back and LE pain no greater than 7/10 for improved comfort and functional ability Baseline: 10/10 at worst Goal status: INITIAL   LONG TERM GOALS: Target date: 11/26/2022   Pt will improve FOTO function score to no less than 44% as proxy for functional improvement Baseline: 35% function Goal status: INITIAL   2.  Pt will self report lower back and LE pain no greater than 3/10 for improved comfort and functional ability Baseline: 10/10 at worst Goal status: INITIAL   3.  Pt will increase 30 Second Sit to Stand rep count to no less than 6 reps for improved balance, strength, and functional mobility Baseline: 3 reps with UE Goal status: INITIAL   4.  Pt will improve LE MMT to no less than 4/5 for all tested motions for improved comfort and decreased LBP Baseline: see MMT chart Goal status: INITIAL  5.  Pt will be able to get back to gardening and fishing not limited by pain for improved comfort and quality of life with desired recreational activity Baseline: unable Goal status: INITIAL   PLAN:  PT FREQUENCY: 1-2x/week  PT DURATION: 8 weeks  PLANNED INTERVENTIONS: Therapeutic exercises, Therapeutic activity, Neuromuscular re-education, Balance training, Gait training, Patient/Family education, Self Care, Joint mobilization, Aquatic Therapy, Dry Needling, Electrical stimulation, Cryotherapy, Moist heat, Vasopneumatic device, Manual therapy, and Re-evaluation.  PLAN FOR NEXT SESSION: core/hip strengthening, aquatic strengthening, TPDN    Eloy End, PT 10/23/2022, 10:13 AM

## 2022-11-06 ENCOUNTER — Ambulatory Visit: Payer: Medicaid Other

## 2022-11-13 ENCOUNTER — Ambulatory Visit: Payer: Medicaid Other

## 2022-11-13 DIAGNOSIS — R2689 Other abnormalities of gait and mobility: Secondary | ICD-10-CM

## 2022-11-13 DIAGNOSIS — M5459 Other low back pain: Secondary | ICD-10-CM | POA: Diagnosis not present

## 2022-11-13 DIAGNOSIS — M6281 Muscle weakness (generalized): Secondary | ICD-10-CM

## 2022-11-13 NOTE — Therapy (Signed)
OUTPATIENT PHYSICAL THERAPY TREATMENT   Patient Name: Sandra Church MRN: [id number] DOB:16-May-1960, 62 y.o., female Today's Date: 11/13/2022  END OF SESSION:  PT End of Session - 11/13/22 1056     Visit Number 4    Number of Visits 17    Date for PT Re-Evaluation 11/26/22    Authorization Type [employer] MCD    PT Start Time 1100    PT Stop Time 1140    PT Time Calculation (min) 40 min    Activity Tolerance Patient tolerated treatment well;Patient limited by pain    Behavior During Therapy [facility] for tasks assessed/performed                Past Medical History:  Diagnosis Date   Anemia    Anxiety    Arthritis    Atrophic endometrium 12/18/2016   Atrophic endometrium 12/18/2016   Back pain with right-sided radiculopathy 03/11/2017   Follows with Dr. Inda Merlin and receives occasional injections. States she was told by Dr. Venetia Maxon that she does not qualify for surgery    Constipation 07/22/2018   Tommi Rumps Quervain's disease (tenosynovitis) 01/17/2019   Fibromyalgia    Functional incontinence 10/07/2019   GERD (gastroesophageal reflux disease)    Heart murmur    yrs ago, heart murmur gone now   History of kidney stones    Hyperlipidemia    Hypertension    Jaundice    AGE 69 OR 21, food poisioning   Medial epicondylitis of elbow, left 03/13/2020   Metatarsal deformity 02/01/2014   MVP (mitral valve prolapse)    Piriformis syndrome of right side 01/09/2017   Postmenopausal bleeding 12/31/2016   Pronation deformity of ankle, acquired 02/01/2014   Stable angina (HCC)    per pt no chest pain since 2017   Status post right foot surgery 04/12/2014   Superficial burn 07/08/2019   Urinary incontinence 10/13/2018   Wears glasses    Wears partial dentures    UPPER   Past Surgical History:  Procedure Laterality Date   CLOSED MANIPULATION KNEE WITH STERIOD INJECTION  2011   rt   COLONOSCOPY  AT AGE 28-normal exam   [place]   [place] Osteotomy with Bone Graft Right 02/23/2014    Rt foot @ [facility]   DIAGNOSTIC LAPAROSCOPY     FOOT BONE EXCISION     both feet-spurs   FOOT SURGERY Bilateral    plantar fascitis   HYSTEROSCOPY WITH D & C N/A 12/18/2016   Procedure: DILATATION AND CURETTAGE /HYSTEROSCOPY;  Surgeon: Sherian Rein, MD;  Location: [facility];  Service: Gynecology;  Laterality: N/A;   JOINT REPLACEMENT  2011   rt total knee replacement   KNEE ARTHROPLASTY  2012   rt   MINOR RELEASE DORSAL COMPARTMENT (DEQUERVAINS) Left 04/19/2019   Procedure: MINOR RELEASE DORSAL COMPARTMENT (DEQUERVAINS);  Surgeon: Sheral Apley, MD;  Location: [facility];  Service: Orthopedics;  Laterality: Left;   OPEN REDUCTION INTERNAL FIXATION (ORIF) METACARPAL Left 07/06/2012   Procedure: LEFT HAND EXPLORATION WOUNDS, OPEN REDUCTION INTERNAL FIXATION SMALL METACARPAL FRACTURE;  Surgeon: Tami Ribas, MD;  Location: [facility];  Service: Orthopedics;  Laterality: Left;   REPAIR EXTENSOR TENDON Left 07/06/2012   Procedure: REPAIR EXTENSOR TENDON;  Surgeon: Tami Ribas, MD;  Location: [facility];  Service: Orthopedics;  Laterality: Left;  Left    SHOULDER ARTHROSCOPY WITH DISTAL CLAVICLE RESECTION Left 01/24/2016   Procedure: SHOULDER ARTHROSCOPY WITH DISTAL CLAVICLE RESECTION;  Surgeon: Reuel Boom  Georg Ruddle, MD;  Location: [facility];  Service: Orthopedics;  Laterality: Left;   SHOULDER ARTHROSCOPY WITH ROTATOR CUFF REPAIR AND SUBACROMIAL DECOMPRESSION Left 01/24/2016   Procedure: LEFT SHOULDER ARTHROSCOPY ACROMIOPLASTY, CLAVICLECTOMY, ARTHROSCOPIC ROTATOR CUFF REPAIR;  Surgeon: Loreta Ave, MD;  Location: [facility];  Service: Orthopedics;  Laterality: Left;   SHOULDER SURGERY  2010   rt   Patient Active Problem List   Diagnosis Date Noted   Gastritis 10/23/2020   Vitamin D deficiency 09/23/2020   Hyperlipidemia 03/13/2020   Chronic low back pain with sciatica 10/07/2019   Plantar  fasciitis, bilateral 07/22/2018   Hypertension 10/08/2016   Fibromyalgia 10/08/2016   Osteoarthritis 10/08/2016   GERD (gastroesophageal reflux disease) 10/08/2016   Preventative health care 10/08/2016    PCP: Gwenevere Abbot, MD  REFERRING PROVIDER: Reymundo Poll, MD   REFERRING DIAG: [phone] (ICD-10-CM) - Chronic low back pain with sciatica, sciatica laterality unspecified, unspecified back pain laterality   Rationale for Evaluation and Treatment: Rehabilitation  THERAPY DIAG:  Other low back pain  Muscle weakness (generalized)  Other abnormalities of gait and mobility  ONSET DATE: Chronic  SUBJECTIVE:                                                                                                                                                                                           SUBJECTIVE STATEMENT: Pt presents to PT with reports of continued pain and discomfort in lower back and L leg. Has been compliant with HEP.   PERTINENT HISTORY:  HTN, Fibromyalgia, R TKA  PAIN:  Are you having pain?  Yes: NPRS scale: 7/10 Worst: 10/10 Pain location: lower back, BIL LE (L>R) Pain description: sharp pain, tingling Aggravating factors:  Relieving factors: heat, ice, rest  PRECAUTIONS: None  RED FLAGS: None   WEIGHT BEARING RESTRICTIONS: No  FALLS:  Has patient fallen in last 6 months? No  LIVING ENVIRONMENT: Lives with: lives alone Lives in: House/apartment Stairs: Has stairs - 4 STE with bilateral handrails; uses ramp entrance as well Has following equipment at home: Single point cane, Walker - 2 wheeled, shower chair, and Grab bars  OCCUPATION: Not working  PLOF: Independent  PATIENT GOALS: decrease back pain in order to get back to fishing and gardening with improved comfort  OBJECTIVE:   DIAGNOSTIC FINDINGS:  See imaging for past lumbar MRI  PATIENT SURVEYS:  FOTO: 35% function; 44% predicted  COGNITION: Overall cognitive status: Within  functional limits for tasks assessed     SENSATION: Light touch: Impaired   MUSCLE LENGTH: Hamstrings: DNT Thomas test: Right (+); Left (+)  POSTURE: rounded shoulders,  forward head, and increased lumbar lordosis  PALPATION: TTP to L gluteals, L lumbar paraspinals  LOWER EXTREMITY MMT:    MMT Right eval Left eval  Hip flexion 3+/5 3/5  Hip extension    Hip abduction 3/5 3+/5  Hip adduction    Hip internal rotation    Hip external rotation    Knee flexion 4/5 3+/5  Knee extension 4/5 4/5  Ankle dorsiflexion    Ankle plantarflexion    Ankle inversion    Ankle eversion     (Blank rows = not tested)  LUMBAR SPECIAL TESTS:  Straight leg raise test: Positive and Slump test: Positive  FUNCTIONAL TESTS:  30 Second Sit to Stand: 3 reps with UE  GAIT: Distance walked: 54ft Assistive device utilized: Single point cane Level of assistance: SBA Comments: antalgic gait L, trunk lean L  TREATMENT: [facility] Adult PT Treatment:                                                DATE: 11/13/2022 Therapeutic Exercise: NuStep lvl 5 UE/LE x 4 min while taking subjective LTR x 5 each - 5" hold Supine PPT x 10 - 5" hold Supine PPT with ball x 10 Supine SLR (small range) x 10 each Seated clamshell 2x15 GTB LAQ 2x10 2.5# Seated row 2x10 GTB Seated sciatic nerve glide x 10 L Modalities: MHP to lumbar paraspinals during supine exercises  [facility] Adult PT Treatment:                                                DATE: 10/23/2022 Therapeutic Exercise: NuStep lvl 3 UE/LE x 5 min while taking subjective LTR x 5 each - 5" hold Supine PPT x 10 - 5" hold Supine PPT with ball 2x10 Supine SLR (small range) x 10 each Supine clamshell 2x15 GTB LAQ 2x10 2.5# STS x 10 - high table no UE Modalities: MHP to lumbar paraspinals during supine exercises  [facility] Adult PT Treatment:                                                DATE: 10/16/2022 Therapeutic Exercise: NuStep lvl 3 UE/LE x 5 min while  taking subjective Seated hamstring stretch x 30" each Supine PPT x 10 - 5" hold Supine PPT with ball 2x10  Supine SLR (small range) x 10 each LTR x 5 each Seated clamshell 2x15 GTB Seated hamstring stretch x 30" each STS x 10 - high table no UE  [facility] Adult PT Treatment:                                                DATE: 10/01/2022 Therapeutic Exercise: Seated sciatic nerve glide x 10 L Supine PPT x 5 - 5" hold Supine clamshell x 10 RTB LTR x 5 each  PATIENT EDUCATION:  Education details: eval findings, FOTO, HEP, POC Person educated: Patient Education method: Explanation, Demonstration, and Handouts Education comprehension: verbalized understanding and returned  demonstration  HOME EXERCISE PROGRAM: Access Code: [email or web address] URL: [email or web address] Date: 10/23/2022 Prepared by: Edwinna Areola  Exercises - Seated Sciatic Tensioner  - 1-2 x daily - 7 x weekly - 3 sets - 10 reps - Supine Posterior Pelvic Tilt  - 1-2 x daily - 7 x weekly - 2 sets - 10 reps - 5 sec hold - Hooklying Clamshell with Resistance  - 1-2 x daily - 7 x weekly - 3 sets - 10 reps - red band hold - Supine Lower Trunk Rotation  - 1-2 x daily - 7 x weekly - 2 sets - 10 reps - 5 sec hold - Small Range Straight Leg Raise  - 1-2 x daily - 7 x weekly - 2 sets - 10 reps - Long Sitting Ankle Eversion with Resistance  - 1 x daily - 7 x weekly - 3 sets - 10 reps - red band hold - Towel Scrunches  - 1 x daily - 7 x weekly - 2-3 reps - 1-2 min hold  ASSESSMENT:  CLINICAL IMPRESSION: Pt was able to complete prescribed exercises, however, continues to be limited by pain and weakness. Therapy today continued to focus on core and proximal hip strengthening. Will continue to progress as able per POC.   OBJECTIVE IMPAIRMENTS: Abnormal gait, decreased activity tolerance, decreased balance, decreased mobility, difficulty walking, decreased ROM, decreased strength, postural dysfunction, and pain  ACTIVITY  LIMITATIONS: carrying, lifting, sitting, standing, squatting, stairs, transfers, bed mobility, and locomotion level  PARTICIPATION LIMITATIONS: meal prep, cleaning, laundry, driving, shopping, community activity, occupation, and yard work  PERSONAL FACTORS: Fitness, Time since onset of injury/illness/exacerbation, and 1-2 comorbidities: HTN, Fibromyalgia, R TKA  are also affecting patient's functional outcome.   REHAB POTENTIAL: Good  CLINICAL DECISION MAKING: Evolving/moderate complexity  EVALUATION COMPLEXITY: Moderate   GOALS: Goals reviewed with patient? No  SHORT TERM GOALS: Target date: 10/22/2022   Pt will be compliant and knowledgeable with initial HEP for improved comfort and carryover Baseline: initial HEP given  Goal status: INITIAL  2.  Pt will self report lower back and LE pain no greater than 7/10 for improved comfort and functional ability Baseline: 10/10 at worst Goal status: INITIAL   LONG TERM GOALS: Target date: 11/26/2022   Pt will improve FOTO function score to no less than 44% as proxy for functional improvement Baseline: 35% function Goal status: INITIAL   2.  Pt will self report lower back and LE pain no greater than 3/10 for improved comfort and functional ability Baseline: 10/10 at worst Goal status: INITIAL   3.  Pt will increase 30 Second Sit to Stand rep count to no less than 6 reps for improved balance, strength, and functional mobility Baseline: 3 reps with UE Goal status: INITIAL   4.  Pt will improve LE MMT to no less than 4/5 for all tested motions for improved comfort and decreased LBP Baseline: see MMT chart Goal status: INITIAL  5.  Pt will be able to get back to gardening and fishing not limited by pain for improved comfort and quality of life with desired recreational activity Baseline: unable Goal status: INITIAL   PLAN:  PT FREQUENCY: 1-2x/week  PT DURATION: 8 weeks  PLANNED INTERVENTIONS: Therapeutic exercises,  Therapeutic activity, Neuromuscular re-education, Balance training, Gait training, Patient/Family education, Self Care, Joint mobilization, Aquatic Therapy, Dry Needling, Electrical stimulation, Cryotherapy, Moist heat, Vasopneumatic device, Manual therapy, and Re-evaluation.  PLAN FOR NEXT SESSION: core/hip strengthening, aquatic strengthening, TPDN    Eloy End,  PT 11/13/2022, 11:44 AM

## 2022-11-20 ENCOUNTER — Ambulatory Visit: Payer: Medicaid Other | Attending: Internal Medicine

## 2022-11-20 DIAGNOSIS — R2689 Other abnormalities of gait and mobility: Secondary | ICD-10-CM | POA: Diagnosis present

## 2022-11-20 DIAGNOSIS — M5459 Other low back pain: Secondary | ICD-10-CM | POA: Diagnosis present

## 2022-11-20 DIAGNOSIS — M6281 Muscle weakness (generalized): Secondary | ICD-10-CM | POA: Diagnosis present

## 2022-11-20 NOTE — Therapy (Signed)
OUTPATIENT PHYSICAL THERAPY TREATMENT/DISCHARGE  PHYSICAL THERAPY DISCHARGE SUMMARY  Visits from Start of Care: 5  Current functional level related to goals / functional outcomes: See goals and objective   Remaining deficits: See goals and objective   Education / Equipment: HEP   Patient agrees to discharge. Patient goals were - some met, others could not progress. Patient is being discharged due to  patient feeling she has maximized rehab potential.   Patient Name: Sandra Church MRN: [id number] DOB:30-May-1960, 62 y.o., female Today's Date: 11/20/2022  END OF SESSION:  PT End of Session - 11/20/22 1058     Visit Number 5    Number of Visits 17    Date for PT Re-Evaluation 11/26/22    Authorization Type [employer] MCD    PT Start Time 1100    PT Stop Time 1125    PT Time Calculation (min) 25 min    Activity Tolerance Patient tolerated treatment well;Patient limited by pain    Behavior During Therapy [facility] for tasks assessed/performed                 Past Medical History:  Diagnosis Date   Anemia    Anxiety    Arthritis    Atrophic endometrium 12/18/2016   Atrophic endometrium 12/18/2016   Back pain with right-sided radiculopathy 03/11/2017   Follows with Dr. Inda Merlin and receives occasional injections. States she was told by Dr. Venetia Maxon that she does not qualify for surgery    Constipation 07/22/2018   Tommi Rumps Quervain's disease (tenosynovitis) 01/17/2019   Fibromyalgia    Functional incontinence 10/07/2019   GERD (gastroesophageal reflux disease)    Heart murmur    yrs ago, heart murmur gone now   History of kidney stones    Hyperlipidemia    Hypertension    Jaundice    AGE 77 OR 21, food poisioning   Medial epicondylitis of elbow, left 03/13/2020   Metatarsal deformity 02/01/2014   MVP (mitral valve prolapse)    Piriformis syndrome of right side 01/09/2017   Postmenopausal bleeding 12/31/2016   Pronation deformity of ankle, acquired 02/01/2014   Stable  angina (HCC)    per pt no chest pain since 2017   Status post right foot surgery 04/12/2014   Superficial burn 07/08/2019   Urinary incontinence 10/13/2018   Wears glasses    Wears partial dentures    UPPER   Past Surgical History:  Procedure Laterality Date   CLOSED MANIPULATION KNEE WITH STERIOD INJECTION  2011   rt   COLONOSCOPY  AT AGE 51-normal exam   [place]   [place] Osteotomy with Bone Graft Right 02/23/2014   Rt foot @ [facility]   DIAGNOSTIC LAPAROSCOPY     FOOT BONE EXCISION     both feet-spurs   FOOT SURGERY Bilateral    plantar fascitis   HYSTEROSCOPY WITH D & C N/A 12/18/2016   Procedure: DILATATION AND CURETTAGE /HYSTEROSCOPY;  Surgeon: Sherian Rein, MD;  Location: [facility];  Service: Gynecology;  Laterality: N/A;   JOINT REPLACEMENT  2011   rt total knee replacement   KNEE ARTHROPLASTY  2012   rt   MINOR RELEASE DORSAL COMPARTMENT (DEQUERVAINS) Left 04/19/2019   Procedure: MINOR RELEASE DORSAL COMPARTMENT (DEQUERVAINS);  Surgeon: Sheral Apley, MD;  Location: [facility];  Service: Orthopedics;  Laterality: Left;   OPEN REDUCTION INTERNAL FIXATION (ORIF) METACARPAL Left 07/06/2012   Procedure: LEFT HAND EXPLORATION WOUNDS, OPEN REDUCTION INTERNAL FIXATION SMALL METACARPAL FRACTURE;  Surgeon: Caryn Bee  Georges Lynch, MD;  Location: [facility];  Service: Orthopedics;  Laterality: Left;   REPAIR EXTENSOR TENDON Left 07/06/2012   Procedure: REPAIR EXTENSOR TENDON;  Surgeon: Tami Ribas, MD;  Location: [facility];  Service: Orthopedics;  Laterality: Left;  Left    SHOULDER ARTHROSCOPY WITH DISTAL CLAVICLE RESECTION Left 01/24/2016   Procedure: SHOULDER ARTHROSCOPY WITH DISTAL CLAVICLE RESECTION;  Surgeon: Loreta Ave, MD;  Location: [facility];  Service: Orthopedics;  Laterality: Left;   SHOULDER ARTHROSCOPY WITH ROTATOR CUFF REPAIR AND SUBACROMIAL DECOMPRESSION Left 01/24/2016   Procedure: LEFT  SHOULDER ARTHROSCOPY ACROMIOPLASTY, CLAVICLECTOMY, ARTHROSCOPIC ROTATOR CUFF REPAIR;  Surgeon: Loreta Ave, MD;  Location:  SURGERY CENTER;  Service: Orthopedics;  Laterality: Left;   SHOULDER SURGERY  2010   rt   Patient Active Problem List   Diagnosis Date Noted   Gastritis 10/23/2020   Vitamin D deficiency 09/23/2020   Hyperlipidemia 03/13/2020   Chronic low back pain with sciatica 10/07/2019   Plantar fasciitis, bilateral 07/22/2018   Hypertension 10/08/2016   Fibromyalgia 10/08/2016   Osteoarthritis 10/08/2016   GERD (gastroesophageal reflux disease) 10/08/2016   Preventative health care 10/08/2016    PCP: Gwenevere Abbot, MD  REFERRING PROVIDER: Reymundo Poll, MD   REFERRING DIAG: [phone] (ICD-10-CM) - Chronic low back pain with sciatica, sciatica laterality unspecified, unspecified back pain laterality   Rationale for Evaluation and Treatment: Rehabilitation  THERAPY DIAG:  Other low back pain  Muscle weakness (generalized)  Other abnormalities of gait and mobility  ONSET DATE: Chronic  SUBJECTIVE:                                                                                                                                                                                           SUBJECTIVE STATEMENT: Pt presents to PT with reports of L knee pain, although her back is feeling a little better.   PERTINENT HISTORY:  HTN, Fibromyalgia, R TKA  PAIN:  Are you having pain?  Yes: NPRS scale: 7/10 Worst: 10/10 Pain location: lower back, BIL LE (L>R) Pain description: sharp pain, tingling Aggravating factors:  Relieving factors: heat, ice, rest  PRECAUTIONS: None  RED FLAGS: None   WEIGHT BEARING RESTRICTIONS: No  FALLS:  Has patient fallen in last 6 months? No  LIVING ENVIRONMENT: Lives with: lives alone Lives in: House/apartment Stairs: Has stairs - 4 STE with bilateral handrails; uses ramp entrance as well Has following  equipment at home: Single point cane, Walker - 2 wheeled, shower chair, and Grab bars  OCCUPATION: Not working  PLOF: Independent  PATIENT GOALS: decrease back pain in order  to get back to fishing and gardening with improved comfort  OBJECTIVE:   DIAGNOSTIC FINDINGS:  See imaging for past lumbar MRI  PATIENT SURVEYS:  FOTO: 35% function; 44% predicted 11/20/2022: 24% function  COGNITION: Overall cognitive status: Within functional limits for tasks assessed     SENSATION: Light touch: Impaired   MUSCLE LENGTH: Hamstrings: DNT Thomas test: Right (+); Left (+)  POSTURE: rounded shoulders, forward head, and increased lumbar lordosis  PALPATION: TTP to L gluteals, L lumbar paraspinals  LOWER EXTREMITY MMT:    MMT Right eval Left eval Right 11/20/22 Left 11/20/22  Hip flexion 3+/5 3/5 3/5 3/5  Hip extension      Hip abduction 3/5 3+/5 3/5 3/5  Hip adduction      Hip internal rotation      Hip external rotation      Knee flexion 4/5 3+/5 4/5 2+/5  Knee extension 4/5 4/5    Ankle dorsiflexion      Ankle plantarflexion      Ankle inversion      Ankle eversion       (Blank rows = not tested)  LUMBAR SPECIAL TESTS:  Straight leg raise test: Positive and Slump test: Positive  FUNCTIONAL TESTS:  30 Second Sit to Stand: 3 reps with UE 11/20/2022: 2 reps with UE  GAIT: Distance walked: 38ft Assistive device utilized: Single point cane Level of assistance: SBA Comments: antalgic gait L, trunk lean L  TREATMENT: [facility] Adult PT Treatment:                                                DATE: 11/20/2022 Therapeutic Exercise: LTR x 5 each - 5" hold Supine PPT x 10 - 5" hold Supine clamshell x 15 GTB Therapeutic Activity: Assessment of tests/measures, goals, and outcomes at discharge  [facility] Adult PT Treatment:                                                DATE: 11/13/2022 Therapeutic Exercise: NuStep lvl 5 UE/LE x 4 min while taking subjective LTR x 5 each - 5"  hold Supine PPT x 10 - 5" hold Supine PPT with ball x 10 Supine SLR (small range) x 10 each Seated clamshell 2x15 GTB LAQ 2x10 2.5# Seated row 2x10 GTB Seated sciatic nerve glide x 10 L Modalities: MHP to lumbar paraspinals during supine exercises  [facility] Adult PT Treatment:                                                DATE: 10/23/2022 Therapeutic Exercise: NuStep lvl 3 UE/LE x 5 min while taking subjective LTR x 5 each - 5" hold Supine PPT x 10 - 5" hold Supine PPT with ball 2x10 Supine SLR (small range) x 10 each Supine clamshell 2x15 GTB LAQ 2x10 2.5# STS x 10 - high table no UE Modalities: MHP to lumbar paraspinals during supine exercises  [facility] Adult PT Treatment:  DATE: 10/16/2022 Therapeutic Exercise: NuStep lvl 3 UE/LE x 5 min while taking subjective Seated hamstring stretch x 30" each Supine PPT x 10 - 5" hold Supine PPT with ball 2x10  Supine SLR (small range) x 10 each LTR x 5 each Seated clamshell 2x15 GTB Seated hamstring stretch x 30" each STS x 10 - high table no UE  [facility] Adult PT Treatment:                                                DATE: 10/01/2022 Therapeutic Exercise: Seated sciatic nerve glide x 10 L Supine PPT x 5 - 5" hold Supine clamshell x 10 RTB LTR x 5 each  PATIENT EDUCATION:  Education details: eval findings, FOTO, HEP, POC Person educated: Patient Education method: Explanation, Demonstration, and Handouts Education comprehension: verbalized understanding and returned demonstration  HOME EXERCISE PROGRAM: Access Code: [email or web address] URL: [email or web address] Date: 11/20/2022 Prepared by: Edwinna Areola  Exercises - Seated Sciatic Tensioner  - 1-2 x daily - 7 x weekly - 3 sets - 10 reps - Supine Posterior Pelvic Tilt  - 1-2 x daily - 7 x weekly - 2 sets - 10 reps - 5 sec hold - Supine Lower Trunk Rotation  - 1-2 x daily - 7 x weekly - 2 sets - 10 reps - 5 sec hold - Hooklying  Clamshell with Resistance  - 1-2 x daily - 7 x weekly - 3 sets - 10 reps - red band hold - Small Range Straight Leg Raise  - 1-2 x daily - 7 x weekly - 2 sets - 10 reps - Long Sitting Ankle Eversion with Resistance  - 1 x daily - 7 x weekly - 3 sets - 10 reps - red band hold - Towel Scrunches  - 1 x daily - 7 x weekly - 2-3 reps - 1-2 min hold  ASSESSMENT:  CLINICAL IMPRESSION: Pt tolerated treatment fair but was limited by severe L knee pain today. Over the course of PT treatment her back pain has slightly improved but overall she has not progressed very well. Decrease in FOTO score and no change in 30 Second Sit to Stand indicate maximized rehab potential. Pt should continue to improve with HEP compliance and is being discharged at this time.  OBJECTIVE IMPAIRMENTS: Abnormal gait, decreased activity tolerance, decreased balance, decreased mobility, difficulty walking, decreased ROM, decreased strength, postural dysfunction, and pain  ACTIVITY LIMITATIONS: carrying, lifting, sitting, standing, squatting, stairs, transfers, bed mobility, and locomotion level  PARTICIPATION LIMITATIONS: meal prep, cleaning, laundry, driving, shopping, community activity, occupation, and yard work  PERSONAL FACTORS: Fitness, Time since onset of injury/illness/exacerbation, and 1-2 comorbidities: HTN, Fibromyalgia, R TKA  are also affecting patient's functional outcome.   REHAB POTENTIAL: Good  CLINICAL DECISION MAKING: Evolving/moderate complexity  EVALUATION COMPLEXITY: Moderate   GOALS: Goals reviewed with patient? No  SHORT TERM GOALS: Target date: 10/22/2022   Pt will be compliant and knowledgeable with initial HEP for improved comfort and carryover Baseline: initial HEP given  Goal status: ME  2.  Pt will self report lower back and LE pain no greater than 7/10 for improved comfort and functional ability Baseline: 10/10 at worst 11/20/2022: 8/10 Goal status: PARTIALLY MET   LONG TERM GOALS:  Target date: 11/26/2022   Pt will improve FOTO function score to no less than 44% as proxy for functional  improvement Baseline: 35% function 11/20/2022: 24% function Goal status: NOT MET   2.  Pt will self report lower back and LE pain no greater than 3/10 for improved comfort and functional ability Baseline: 10/10 at worst Goal status: INITIAL   3.  Pt will increase 30 Second Sit to Stand rep count to no less than 6 reps for improved balance, strength, and functional mobility Baseline: 3 reps with UE Goal status: NOT MET   4.  Pt will improve LE MMT to no less than 4/5 for all tested motions for improved comfort and decreased LBP Baseline: see MMT chart Goal status: NOT MET  5.  Pt will be able to get back to gardening and fishing not limited by pain for improved comfort and quality of life with desired recreational activity Baseline: unable Goal status: NOT MET   PLAN:  PT FREQUENCY: 1-2x/week  PT DURATION: 8 weeks  PLANNED INTERVENTIONS: Therapeutic exercises, Therapeutic activity, Neuromuscular re-education, Balance training, Gait training, Patient/Family education, Self Care, Joint mobilization, Aquatic Therapy, Dry Needling, Electrical stimulation, Cryotherapy, Moist heat, Vasopneumatic device, Manual therapy, and Re-evaluation.  PLAN FOR NEXT SESSION: core/hip strengthening, aquatic strengthening, TPDN    Eloy End, PT 11/20/2022, 11:38 AM

## 2022-12-31 ENCOUNTER — Other Ambulatory Visit: Payer: Self-pay

## 2022-12-31 ENCOUNTER — Ambulatory Visit (INDEPENDENT_AMBULATORY_CARE_PROVIDER_SITE_OTHER): Payer: Medicaid Other | Admitting: Internal Medicine

## 2022-12-31 ENCOUNTER — Encounter: Payer: Self-pay | Admitting: Internal Medicine

## 2022-12-31 VITALS — BP 128/84 | HR 76 | Temp 98.0°F | Resp 28 | Ht 67.0 in | Wt 186.8 lb

## 2022-12-31 DIAGNOSIS — M5440 Lumbago with sciatica, unspecified side: Secondary | ICD-10-CM | POA: Diagnosis not present

## 2022-12-31 DIAGNOSIS — E785 Hyperlipidemia, unspecified: Secondary | ICD-10-CM | POA: Diagnosis not present

## 2022-12-31 DIAGNOSIS — K2960 Other gastritis without bleeding: Secondary | ICD-10-CM

## 2022-12-31 DIAGNOSIS — G8929 Other chronic pain: Secondary | ICD-10-CM | POA: Diagnosis not present

## 2022-12-31 DIAGNOSIS — K219 Gastro-esophageal reflux disease without esophagitis: Secondary | ICD-10-CM

## 2022-12-31 DIAGNOSIS — I10 Essential (primary) hypertension: Secondary | ICD-10-CM

## 2022-12-31 DIAGNOSIS — R1013 Epigastric pain: Secondary | ICD-10-CM

## 2022-12-31 MED ORDER — OXYCODONE HCL 5 MG PO TABS
5.0000 mg | ORAL_TABLET | Freq: Every day | ORAL | 0 refills | Status: AC | PRN
Start: 2022-12-31 — End: 2023-01-30

## 2022-12-31 MED ORDER — OMEPRAZOLE 20 MG PO CPDR
20.0000 mg | DELAYED_RELEASE_CAPSULE | Freq: Every day | ORAL | Status: DC | PRN
Start: 2022-12-31 — End: 2023-05-18

## 2022-12-31 MED ORDER — AMLODIPINE BESYLATE-VALSARTAN 5-160 MG PO TABS: 1.0000 | ORAL_TABLET | Freq: Every day | ORAL | 2 refills | Status: DC

## 2022-12-31 MED ORDER — OMEPRAZOLE 40 MG PO CPDR
40.0000 mg | DELAYED_RELEASE_CAPSULE | Freq: Every day | ORAL | Status: DC | PRN
Start: 2022-12-31 — End: 2022-12-31

## 2022-12-31 MED ORDER — OMEPRAZOLE 40 MG PO CPDR
40.0000 mg | DELAYED_RELEASE_CAPSULE | Freq: Every day | ORAL | Status: DC
Start: 2022-12-31 — End: 2022-12-31

## 2022-12-31 NOTE — Assessment & Plan Note (Signed)
Pt has hx of HLD. She is on lipitor 20 mg every day. LDL 92 in 02/24. ASCVD risk is 5 %. Initially she told me she was not taking her lipitor but later stated she was when I mentioned atorvastatin. Will get lipid panel this visit.

## 2022-12-31 NOTE — Patient Instructions (Signed)
Ms.Danyia L Tresa Res, it was a pleasure seeing you today! You endorsed feeling well today. Below are some of the things we talked about this visit. We look forward to seeing you in the follow up appointment!  Today we discussed: For your blood pressure, please take the new medicine every day.   For your pain, I am starting a new medicine. Take it only for severe pain and minimize its use. You can also go to the hot pool at [facility] on [address].   I will get lab work today.   I have ordered the following labs today:   Lab Orders         BMP8+Anion Gap         Lipid Profile       Referrals ordered today:   Referral Orders  No referral(s) requested today     I have ordered the following medication/changed the following medications:   Stop the following medications: Medications Discontinued During This Encounter  Medication Reason   atorvastatin (LIPITOR) 20 MG tablet Patient has not taken in last 30 days   Vitamin D, Ergocalciferol, (DRISDOL) 1.25 MG (50000 UNIT) CAPS capsule Patient has not taken in last 30 days   DULoxetine (CYMBALTA) 60 MG capsule Patient has not taken in last 30 days   Cholecalciferol 20 MCG (800 UNIT) TABS Patient has not taken in last 30 days   diphenhydrAMINE (BENADRYL) 25 mg capsule Patient has not taken in last 30 days   famotidine (PEPCID) 20 MG tablet Patient has not taken in last 30 days   omeprazole (PRILOSEC) 40 MG capsule Reorder   amLODIPine-Valsartan-HCTZ 5-160-12.5 MG TABS Duplicate     Start the following medications: Meds ordered this encounter  Medications   omeprazole (PRILOSEC) 40 MG capsule    Sig: Take 1 capsule (40 mg total) by mouth daily.   amLODipine-valsartan (EXFORGE) 5-160 MG tablet    Sig: Take 1 tablet by mouth daily.    Dispense:  30 tablet    Refill:  2   oxyCODONE (ROXICODONE) 5 MG immediate release tablet    Sig: Take 1 tablet (5 mg total) by mouth daily as needed.    Dispense:  14 tablet    Refill:  0      Follow-up: 3 month follow up   Please make sure to arrive 15 minutes prior to your next appointment. If you arrive late, you may be asked to reschedule.   We look forward to seeing you next time. Please call our clinic at [phone] if you have any questions or concerns. The best time to call is Monday-Friday from 9am-4pm, but there is someone available 24/7. If after hours or the weekend, call the main hospital number and ask for the Internal Medicine Resident On-Call. If you need medication refills, please notify your pharmacy one week in advance and they will send [place] a request.  Thank you for letting us take part in your care. Wishing you the best!  Thank you, Gwenevere Abbot, MD

## 2022-12-31 NOTE — Assessment & Plan Note (Signed)
Pt has GERD that is well controlled on prn prilosec. Will continue for now but place refill dose at 20 mg.

## 2022-12-31 NOTE — Assessment & Plan Note (Signed)
Pt with hx of HTN who is on Amlodipine-valsartan-hydrochlorothiazide 5-160-12.5 mg. Reports she is taking it only 4 days a week as regular use lead to lower BP. She reported 90s/60s to me as the lowest reading seen. Plan to discontinue hydrochlorothiazide and have pt take her antihypertensive regularly. Normal renal function in 02/2022. Amlodipine-valsartan 5-160 sent to pharmacy.

## 2022-12-31 NOTE — Progress Notes (Unsigned)
CC: PCP follow up  HPI:  [name] is a 62 y.o. with medical history of HTN, HLD, OA, GERD presenting to [facility] for PCP follow up.  Please see problem-based list for further details, assessments, and plans.  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 Inda Merlin and receives occasional injections. States she was told by Dr. Venetia Maxon that she does not qualify for surgery    Constipation 07/22/2018   Tommi Rumps Quervain's disease (tenosynovitis) 01/17/2019   Fibromyalgia    Functional incontinence 10/07/2019   Gastritis 10/23/2020   GERD (gastroesophageal reflux disease)    Heart murmur    yrs ago, heart murmur gone now   History of kidney stones    Hyperlipidemia    Hypertension    Jaundice    AGE 33 OR 21, food poisioning   Medial epicondylitis of elbow, left 03/13/2020   Metatarsal deformity 02/01/2014   MVP (mitral valve prolapse)    Piriformis syndrome of right side 01/09/2017   Postmenopausal bleeding 12/31/2016   Pronation deformity of ankle, acquired 02/01/2014   Stable angina (HCC)    per pt no chest pain since 2017   Status post right foot surgery 04/12/2014   Superficial burn 07/08/2019   Urinary incontinence 10/13/2018   Wears glasses    Wears partial dentures    UPPER     Current Outpatient Medications (Cardiovascular):    amLODipine-valsartan (EXFORGE) 5-160 MG tablet, Take 1 tablet by mouth daily.   Current Outpatient Medications (Analgesics):    oxyCODONE (ROXICODONE) 5 MG immediate release tablet, Take 1 tablet (5 mg total) by mouth daily as needed.   acetaminophen (TYLENOL) 500 MG tablet, Take 500 mg by mouth every 6 (six) hours as needed.   Current Outpatient Medications (Other):    diclofenac Sodium (VOLTAREN) 1 % GEL, Apply 4 g topically 4 (four) times daily.   methocarbamol  (ROBAXIN-750) 750 MG tablet, Take 1 tablet (750 mg total) by mouth 4 (four) times daily.   omeprazole (PRILOSEC) 20 MG capsule, Take 1 capsule (20 mg total) by mouth daily as needed.  Review of Systems:  Review of system negative unless stated in the problem list or HPI.    Physical Exam:  Vitals:   12/31/22 1024  BP: 128/84  Pulse: 76  Resp: (!) 28  Temp: 98 F (36.7 C)  TempSrc: Oral  SpO2: 99%  Weight: 186 lb 12.8 oz (84.7 kg)  Height: 5\' 7"  (1.702 m)   Physical Exam General: NAD HENT: NCAT Lungs: CTAB, no wheeze, rhonchi or rales.  Cardiovascular: Normal heart sounds, no r/m/g, 2+ pulses in all extremities. No LE edema Abdomen: No TTP, normal bowel sounds MSK: No asymmetry or muscle atrophy.  Skin: no lesions noted on exposed skin Neuro: Alert and oriented x4. CN grossly intact Psych: Normal mood and normal affect   Assessment & Plan:   Hypertension Pt with hx of HTN who is on Amlodipine-valsartan-hydrochlorothiazide 5-160-12.5 mg. Reports she is taking it only 4 days a week as regular use lead to lower BP. She reported 90s/60s to me as the lowest reading seen. Plan to discontinue hydrochlorothiazide and have pt take her antihypertensive regularly. Normal renal function in 02/2022. Amlodipine-valsartan 5-160 sent to pharmacy.   Hyperlipidemia Pt has hx of HLD. She is on lipitor 20 mg every  day. LDL 92 in 02/24. ASCVD risk is 5 %. Initially she told me she was not taking her lipitor but later stated she was when I mentioned atorvastatin. Will get lipid panel this visit.   GERD (gastroesophageal reflux disease) Pt has GERD that is well controlled on prn prilosec. Will continue for now but place refill dose at 20 mg prn.   Chronic low back pain with sciatica Pt with pain improved using multimodal pain approach including thermal therapy, aquatic therapy, APAP, ibuprofen and robaxin. She still has days her pain is significant to point that it limits her from doing her  ADLs/IADLS. Discussed further interventions including public hot pool. Given the flare ups, she will benefit from prn oxycodone for the severe episodes. Pain contract signed and advised pt to start using it nightly and avoiding machinery when using this.     See Encounters Tab for problem based charting.  Patient Discussed with Dr. Sheran Lawless, MD Eligha Bridegroom. [facility] Internal Medicine Residency, PGY-3

## 2023-01-01 LAB — LIPID PANEL
Chol/HDL Ratio: 2.7 {ratio} (ref 0.0–4.4)
Cholesterol, Total: 154 mg/dL (ref 100–199)
HDL: 58 mg/dL (ref >39)
LDL Chol Calc (NIH): 84 mg/dL (ref 0–99)
Triglycerides: 59 mg/dL (ref 0–149)
VLDL Cholesterol Cal: 12 mg/dL (ref 5–40)

## 2023-01-01 LAB — BMP8+ANION GAP
Anion Gap: 15 mmol/L (ref 10.0–18.0)
BUN/Creatinine Ratio: 19 (ref 12–28)
BUN: 13 mg/dL (ref 8–27)
CO2: 23 mmol/L (ref 20–29)
Calcium: 9.7 mg/dL (ref 8.7–10.3)
Chloride: 103 mmol/L (ref 96–106)
Creatinine, Ser: 0.67 mg/dL (ref 0.57–1.00)
Glucose: 87 mg/dL (ref 70–99)
Potassium: 4 mmol/L (ref 3.5–5.2)
Sodium: 141 mmol/L (ref 134–144)
eGFR: 98 mL/min/{1.73_m2} (ref >59)

## 2023-01-01 NOTE — Assessment & Plan Note (Signed)
Pt with pain improved using multimodal pain approach including thermal therapy, aquatic therapy, APAP, ibuprofen and robaxin. She still has days her pain is significant to point that it limits her from doing her ADLs/IADLS. Discussed further interventions including public hot pool. Given the flare ups, she will benefit from prn oxycodone for the severe episodes. Pain contract signed and advised pt to start using it nightly and avoiding machinery when using this.

## 2023-01-09 NOTE — Progress Notes (Signed)
Internal Medicine Clinic Attending  Case discussed with the resident at the time of the visit.  We reviewed the resident's history and exam and pertinent patient test results.  I agree with the assessment, diagnosis, and plan of care documented in the resident's note.  

## 2023-01-12 ENCOUNTER — Telehealth: Payer: Self-pay

## 2023-01-12 DIAGNOSIS — Z01419 Encounter for gynecological examination (general) (routine) without abnormal findings: Secondary | ICD-10-CM | POA: Diagnosis not present

## 2023-01-12 DIAGNOSIS — N951 Menopausal and female climacteric states: Secondary | ICD-10-CM | POA: Diagnosis not present

## 2023-01-12 DIAGNOSIS — Z1231 Encounter for screening mammogram for malignant neoplasm of breast: Secondary | ICD-10-CM | POA: Diagnosis not present

## 2023-01-12 DIAGNOSIS — Z1389 Encounter for screening for other disorder: Secondary | ICD-10-CM | POA: Diagnosis not present

## 2023-01-12 DIAGNOSIS — M797 Fibromyalgia: Secondary | ICD-10-CM | POA: Diagnosis not present

## 2023-01-12 DIAGNOSIS — Z01411 Encounter for gynecological examination (general) (routine) with abnormal findings: Secondary | ICD-10-CM | POA: Diagnosis not present

## 2023-01-12 NOTE — Telephone Encounter (Signed)
Decision:Approved  Illene Tresa Res (Key: [id number]) PA Case ID #: [id number] Need Help? Call us at [phone] Outcome Approved today by [facility] 2017 NCPDP Request Reference Number: [id number]. OXYCODONE TAB 5MG  is approved through 07/13/2023. For further questions, call [employer] at [phone]. Authorization Expiration Date: 07/13/2023 Drug oxyCODONE HCl 5MG  tablets ePA cloud logo Form [employer] Electronic Prior Authorization Form [id number] NCPDP)

## 2023-01-12 NOTE — Telephone Encounter (Signed)
Prior Authorization for patient (oxyCODONE HCl 5MG  tablets) came through on cover my meds was submitted with last office notes awaiting approval or denial.  OZH:[id number]

## 2023-04-03 ENCOUNTER — Telehealth: Payer: Self-pay | Admitting: *Deleted

## 2023-04-03 ENCOUNTER — Other Ambulatory Visit: Payer: Self-pay | Admitting: Internal Medicine

## 2023-04-03 DIAGNOSIS — M1990 Unspecified osteoarthritis, unspecified site: Secondary | ICD-10-CM

## 2023-04-03 NOTE — Telephone Encounter (Signed)
Will forward to PCP for refill. 

## 2023-04-03 NOTE — Telephone Encounter (Signed)
 Copied from CRM [id number]. Topic: Clinical - Prescription Issue >> Apr 03, 2023  9:29 AM Irine Seal wrote: Reason for CRM: Patient's Hydrocodone-acetaminophen prescription is expired, and the earliest available appointment is May 18, 2023. Patient prefers to see Dr. Gwenevere Abbot and refused to be scheduled with any other provider. They scheduled the next available appointment in May and were informed that an appointment is required for a possible refill and that no sooner availability exists. Despite this, the patient chose to keep the May appointment and inquired about having the medication called in before the visit

## 2023-04-03 NOTE — Telephone Encounter (Signed)
 Copied from CRM [id number]. Topic: Clinical - Medication Refill >> Apr 03, 2023  9:27 AM Irine Seal wrote: Most Recent Primary Care Visit:  Provider: Gwenevere Abbot  Department: [facility]  Visit Type: OPEN ESTABLISHED  Date: 12/31/2022  Medication: diclofenac Sodium (VOLTAREN) 1 % GEL  Has the patient contacted their pharmacy? No (Agent: If no, request that the patient contact the pharmacy for the refill. If patient does not wish to contact the pharmacy document the reason why and proceed with request.) (Agent: If yes, when and what did the pharmacy advise?)  Is this the correct pharmacy for this prescription? Yes If no, delete pharmacy and type the correct one.  This is the patient's preferred pharmacy:  [id number] - [place], [place] - [address] AT [facility] OF [address] [place] [address] Phone: [phone] Fax: [phone]   Has the prescription been filled recently? No  Is the patient out of the medication? Yes  Has the patient been seen for an appointment in the last year OR does the patient have an upcoming appointment? Yes  Can we respond through MyChart? Yes  Agent: Please be advised that Rx refills may take up to 3 business days. We ask that you follow-up with your pharmacy.

## 2023-04-06 MED ORDER — DICLOFENAC SODIUM 1 % EX GEL: 4.0000 g | Freq: Four times a day (QID) | CUTANEOUS | 15 refills | Status: AC

## 2023-05-13 ENCOUNTER — Other Ambulatory Visit: Payer: Self-pay | Admitting: Internal Medicine

## 2023-05-13 DIAGNOSIS — I10 Essential (primary) hypertension: Secondary | ICD-10-CM

## 2023-05-13 NOTE — Telephone Encounter (Signed)
 Medication sent to pharmacy

## 2023-05-17 NOTE — Progress Notes (Unsigned)
   CC: PCP follow up  HPI:  [name] is a 63 y.o. with medical history of HTN, HLD, OA, GERD presenting to [facility] for PCP follow up.   Please see problem-based list for further details, assessments, and plans.  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 Annice Kim and receives occasional injections. States she was told by Dr. Nigel Bart that she does not qualify for surgery    Constipation 07/22/2018   Eddie Good Quervain's disease (tenosynovitis) 01/17/2019   Fibromyalgia    Functional incontinence 10/07/2019   Gastritis 10/23/2020   GERD (gastroesophageal reflux disease)    Heart murmur    yrs ago, heart murmur gone now   History of kidney stones    Hyperlipidemia    Hypertension    Jaundice    AGE 49 OR 21, food poisioning   Medial epicondylitis of elbow, left 03/13/2020   Metatarsal deformity 02/01/2014   MVP (mitral valve prolapse)    Piriformis syndrome of right side 01/09/2017   Postmenopausal bleeding 12/31/2016   Pronation deformity of ankle, acquired 02/01/2014   Stable angina (HCC)    per pt no chest pain since 2017   Status post right foot surgery 04/12/2014   Superficial burn 07/08/2019   Urinary incontinence 10/13/2018   Wears glasses    Wears partial dentures    UPPER     Current Outpatient Medications (Cardiovascular):    amLODipine -valsartan  (EXFORGE ) 5-160 MG tablet, TAKE 1 TABLET BY MOUTH DAILY   Current Outpatient Medications (Analgesics):    acetaminophen  (TYLENOL ) 500 MG tablet, Take 500 mg by mouth every 6 (six) hours as needed.   Current Outpatient Medications (Other):    diclofenac  Sodium (VOLTAREN ) 1 % GEL, Apply 4 g topically 4 (four) times daily.   methocarbamol  (ROBAXIN -750) 750 MG tablet, Take 1 tablet (750 mg total) by mouth 4 (four) times daily.   omeprazole   (PRILOSEC) 20 MG capsule, Take 1 capsule (20 mg total) by mouth daily as needed.  Review of Systems:  Review of system negative unless stated in the problem list or HPI.    Physical Exam:  There were no vitals filed for this visit. Physical Exam General: NAD HENT: NCAT Lungs: CTAB, no wheeze, rhonchi or rales.  Cardiovascular: Normal heart sounds, no r/m/g, 2+ pulses in all extremities. No LE edema Abdomen: No TTP, normal bowel sounds MSK: No asymmetry or muscle atrophy.  Skin: no lesions noted on exposed skin Neuro: Alert and oriented x4. CN grossly intact Psych: Normal mood and normal affect   Assessment & Plan:   No problem-specific Assessment & Plan notes found for this encounter.   See Encounters Tab for problem based charting.  Patient Discussed with Dr. {NAMES:[id number]::"Guilloud","Hoffman","Mullen","Narendra","Vincent","Machen","Lau","Hatcher","Williams"} Jackolyn Masker, MD Tom[facility] Fragmin. [facility] Internal Medicine Residency, PGY-3   HTN On amlodipine -valsartan  10-160 mg every day. Normal renal fxn 12/2022  GERD  On Prilosec 20 mg every day.    Care Gaps Mammogram Pap Smear

## 2023-05-18 ENCOUNTER — Encounter: Payer: Self-pay | Admitting: Internal Medicine

## 2023-05-18 ENCOUNTER — Ambulatory Visit (INDEPENDENT_AMBULATORY_CARE_PROVIDER_SITE_OTHER): Payer: Self-pay | Admitting: Internal Medicine

## 2023-05-18 VITALS — BP 129/80 | HR 86 | Temp 98.2°F | Ht 67.0 in | Wt 193.9 lb

## 2023-05-18 DIAGNOSIS — Y92009 Unspecified place in unspecified non-institutional (private) residence as the place of occurrence of the external cause: Secondary | ICD-10-CM

## 2023-05-18 DIAGNOSIS — G8929 Other chronic pain: Secondary | ICD-10-CM

## 2023-05-18 DIAGNOSIS — K219 Gastro-esophageal reflux disease without esophagitis: Secondary | ICD-10-CM | POA: Diagnosis not present

## 2023-05-18 DIAGNOSIS — K2960 Other gastritis without bleeding: Secondary | ICD-10-CM

## 2023-05-18 DIAGNOSIS — R1013 Epigastric pain: Secondary | ICD-10-CM

## 2023-05-18 DIAGNOSIS — E559 Vitamin D deficiency, unspecified: Secondary | ICD-10-CM

## 2023-05-18 DIAGNOSIS — M797 Fibromyalgia: Secondary | ICD-10-CM

## 2023-05-18 DIAGNOSIS — I10 Essential (primary) hypertension: Secondary | ICD-10-CM

## 2023-05-18 DIAGNOSIS — W19XXXA Unspecified fall, initial encounter: Secondary | ICD-10-CM

## 2023-05-18 DIAGNOSIS — M159 Polyosteoarthritis, unspecified: Secondary | ICD-10-CM

## 2023-05-18 DIAGNOSIS — M5440 Lumbago with sciatica, unspecified side: Secondary | ICD-10-CM

## 2023-05-18 DIAGNOSIS — Z0000 Encounter for general adult medical examination without abnormal findings: Secondary | ICD-10-CM

## 2023-05-18 MED ORDER — ATORVASTATIN CALCIUM 20 MG PO TABS: 20.0000 mg | ORAL_TABLET | Freq: Every day | ORAL | Status: DC

## 2023-05-18 MED ORDER — OXYCODONE HCL 5 MG PO TABS: 5.0000 mg | ORAL_TABLET | Freq: Four times a day (QID) | ORAL | 0 refills | Status: AC | PRN

## 2023-05-18 MED ORDER — OMEPRAZOLE 20 MG PO CPDR: 20.0000 mg | DELAYED_RELEASE_CAPSULE | Freq: Every day | ORAL | 0 refills | Status: AC

## 2023-05-18 NOTE — Patient Instructions (Addendum)
 Sandra Church, it was a pleasure seeing you today! You endorsed feeling well today. Below are some of the things we talked about this visit. We look forward to seeing you in the follow up appointment!  Today we discussed: Continue taking your medications.   We will order a vitamin D  level.   I will also order bone density test.   You are going to see your OB/Gyn to get mammogram and pap smear done.   I have ordered the following labs today:  Lab Orders         Vitamin D  (25 hydroxy)        Referrals ordered today:   Referral Orders  No referral(s) requested today     I have ordered the following medication/changed the following medications:   Stop the following medications: Medications Discontinued During This Encounter  Medication Reason   omeprazole  (PRILOSEC) 20 MG capsule Reorder     Start the following medications: Meds ordered this encounter  Medications   atorvastatin  (LIPITOR) 20 MG tablet    Sig: Take 1 tablet (20 mg total) by mouth daily.   oxyCODONE  (ROXICODONE ) 5 MG immediate release tablet    Sig: Take 1 tablet (5 mg total) by mouth every 6 (six) hours as needed.    Dispense:  14 tablet    Refill:  0   omeprazole  (PRILOSEC) 20 MG capsule    Sig: Take 1 capsule (20 mg total) by mouth daily.    Dispense:  90 capsule    Refill:  0     Follow-up: 3 month follow up   Please make sure to arrive 15 minutes prior to your next appointment. If you arrive late, you may be asked to reschedule.   We look forward to seeing you next time. Please call our clinic at [phone] if you have any questions or concerns. The best time to call is Monday-Friday from 9am-4pm, but there is someone available 24/7. If after hours or the weekend, call the main hospital number and ask for the Internal Medicine Resident On-Call. If you need medication refills, please notify your pharmacy one week in advance and they will send us  a request.  Thank you for letting us  take  part in your care. Wishing you the best!  Thank you, Jackolyn Masker, MD

## 2023-05-19 DIAGNOSIS — Y92009 Unspecified place in unspecified non-institutional (private) residence as the place of occurrence of the external cause: Secondary | ICD-10-CM | POA: Insufficient documentation

## 2023-05-19 LAB — VITAMIN D 25 HYDROXY (VIT D DEFICIENCY, FRACTURES): Vit D, 25-Hydroxy: 32.4 ng/mL (ref 30.0–100.0)

## 2023-05-19 MED ORDER — VITAMIN D 25 MCG (1000 UNIT) PO TABS
1000.0000 [IU] | ORAL_TABLET | Freq: Every day | ORAL | 3 refills | Status: AC
Start: 2023-05-19 — End: 2024-05-18

## 2023-05-19 NOTE — Assessment & Plan Note (Signed)
 Pt vitamin D  level was checked given the hx of deficiency and came back normal at 32. She was taking 1000 units previously but has stopped. I believe she would benefit from continued low dose supplementation from vitamin D  to keep her levels stable.

## 2023-05-19 NOTE — Assessment & Plan Note (Signed)
 Chronic condition. Well controlled on Prilosec 20 mg every day. Will continue current regimen.

## 2023-05-19 NOTE — Assessment & Plan Note (Addendum)
 Pt with 2 falls at home one was 1.5 months ago and the other was one month ago. Both were due to miscalculation in her movement. One fall was when pt was sitting down and missed her chair and the other was when she missed her step when going up the steps. Pt states she felt okay and did not seek medical care after the fall. She didn't have any LOC although she states she did hit her head on the second fall. Currently her complaints are left sided shoulder and pelvis pain. Less concern for fracture given timeline of her fall and physical exam showing good ROM. This could be secondary to whiplash secondary to her fall vs OA vs rotator cuff tendinopathy. Offered pt PT but she refused. Will continue to monitor her pain at subsequent visit but reassured by physical exam.

## 2023-05-19 NOTE — Assessment & Plan Note (Signed)
 Pt with HTN who is on amlodipine -valsartan  10-160 mg every day. Normal renal fxn 12/2022. Pt was previously on amlodipine -valsartan -hydrochlorothiazide but the hydrochlorothiazide was discontinued last visit. Pt reports good blood pressure readings at home so will continue current regimen.

## 2023-05-19 NOTE — Assessment & Plan Note (Addendum)
 Referral for Dexa scan placed this visit.  Pt states she will [facility] and pap smear.

## 2023-05-19 NOTE — Assessment & Plan Note (Addendum)
 Pt reports her back pain and joint pains responded well to 2.5 mg oxycodone  and it improved her functional status. She broke her 5 mg tablet in half as she thought the 5 mg dose would be too high for her. Will continue oxycodone  prn for her along with other measures including stretching, warm baths, APAP and robaxin  750 mg QID.

## 2023-05-25 NOTE — Progress Notes (Signed)
 Internal Medicine Clinic Attending  Case discussed with the resident at the time of the visit.  We reviewed the resident's history and exam and pertinent patient test results.  I agree with the assessment, diagnosis, and plan of care documented in the resident's note.

## 2023-06-30 ENCOUNTER — Encounter: Payer: Self-pay | Admitting: *Deleted

## 2023-06-30 ENCOUNTER — Other Ambulatory Visit: Payer: Self-pay | Admitting: Student

## 2023-06-30 ENCOUNTER — Telehealth: Payer: Self-pay | Admitting: Internal Medicine

## 2023-06-30 DIAGNOSIS — M1990 Unspecified osteoarthritis, unspecified site: Secondary | ICD-10-CM

## 2023-06-30 NOTE — Telephone Encounter (Unsigned)
 Copied from CRM [id number]. Topic: Clinical - Medication Refill >> Jun 30, 2023  1:19 PM Blair Bumpers wrote: Medication:diclofenac  Sodium (VOLTAREN ) 1 % GEL   Has the patient contacted their pharmacy? Yes, pharmacy told patient to call clinic.  (Agent: If no, request that the patient contact the pharmacy for the refill. If patient does not wish to contact the pharmacy document the reason why and proceed with request.) (Agent: If yes, when and what did the pharmacy advise?)  This is the patient's preferred pharmacy:  [id number] - [place], [place] - [address] AT [facility] OF [address] [place] [address] Phone: [phone] Fax: [phone]  Is this the correct pharmacy for this prescription? Yes If no, delete pharmacy and type the correct one.   Has the prescription been filled recently? Yes  Is the patient out of the medication? Yes  Has the patient been seen for an appointment in the last year OR does the patient have an upcoming appointment? Yes  Can we respond through MyChart? No  Agent: Please be advised that Rx refills may take up to 3 business days. We ask that you follow-up with your pharmacy.

## 2023-06-30 NOTE — Telephone Encounter (Signed)
 Medication last refilled 04/06/23 with 15 refills

## 2023-08-20 DIAGNOSIS — E785 Hyperlipidemia, unspecified: Secondary | ICD-10-CM | POA: Diagnosis not present

## 2023-08-20 DIAGNOSIS — I10 Essential (primary) hypertension: Secondary | ICD-10-CM | POA: Diagnosis not present

## 2023-08-24 ENCOUNTER — Ambulatory Visit: Payer: Self-pay | Admitting: *Deleted

## 2023-08-24 NOTE — Telephone Encounter (Signed)
 FYI Only or Action Required?: Action required by provider: request for appointment and medication refill request.  Patient was last seen in primary care on 05/18/2023 by Fernand Prost, MD.  Called Nurse Triage reporting Pain.  Symptoms began several weeks ago.  Interventions attempted: Rest, hydration, or home remedies.  Symptoms are: gradually worsening.  Triage Disposition: See PCP When Office is Open (Within 3 Days)  Patient/caregiver understands and will follow disposition?: Yes              Copied from CRM #[id number]. Topic: Clinical - Red Word Triage >> Aug 24, 2023  9:18 AM Alfonso ORN wrote: Red Word that prompted transfer to Nurse Triage: ,severe   pain  left side lower back and hip area patient have Fibromyalgia and a old injury  ,  (Patient called to make an appt. For medication refill and cold symptoms waking up with a cold with running nose, head with a lot of clear phelm making pt cough  , then mention the pain in her lower left side)  Pt. Call back number [phone] ----------------------------------------------------------------------- From previous Reason for Contact - Scheduling: Patient/patient representative is calling to schedule an appointment. Refer to attachments for appointment information. Reason for Disposition  [1] MODERATE pain (e.g., interferes with normal activities, limping) AND [2] present > 3 days  Answer Assessment - Initial Assessment Questions Recommended appt with other provider, patient only wants appt with Dr. Myrna that is not available at this time. Unable to assist with scheduling as patient requested. Transferred patient to CAL to assist with scheduling.        1. LOCATION and RADIATION: Where is the pain located? Does the pain spread (shoot) anywhere else?     Left hip and back pain 2. QUALITY: What does the pain feel like?  (e.g., sharp, dull, aching, burning)     Sharp and stabbing throbbing  3. SEVERITY: How bad is  the pain? What does it keep you from doing?   (Scale 1-10; or mild, moderate, severe)     Moderate with using ice packs  4. ONSET: When did the pain start? Does it come and go, or is it there all the time?      On going  5. WORK OR EXERCISE: Has there been any recent work or exercise that involved this part of the body?      Na  6. CAUSE: What do you think is causing the hip pain?      Not sure  7. AGGRAVATING FACTORS: What makes the hip pain worse? (e.g., walking, climbing stairs, running)    Laying causes pain  8. OTHER SYMPTOMS: Do you have any other symptoms? (e.g., back pain, pain shooting down leg,  fever, rash)     Left hip and back pain and now having sx cold sx runny nose, cough clear phlegm  Protocols used: Hip Pain-A-AH

## 2023-08-24 NOTE — Telephone Encounter (Signed)
 Called pt to f/u on her symptoms - no answer; left message to call the office. Pt has an appt tomorrow 8/12 w/Dr Marylu.

## 2023-08-24 NOTE — Telephone Encounter (Signed)
 Received return call from pt Appt confirmed for tomorrow at 08/12 @ 0945  with Dr Marylu with instructions to seek more urgent care should symptoms warrant.SABRASABRAGoldston, Sandra Barb Cassady8/11/20252:34 PM

## 2023-08-25 ENCOUNTER — Ambulatory Visit: Admitting: Student

## 2023-08-25 VITALS — BP 121/79 | HR 78 | Temp 97.8°F | Ht 67.0 in | Wt 192.0 lb

## 2023-08-25 DIAGNOSIS — M797 Fibromyalgia: Secondary | ICD-10-CM | POA: Diagnosis not present

## 2023-08-25 DIAGNOSIS — I10 Essential (primary) hypertension: Secondary | ICD-10-CM

## 2023-08-25 DIAGNOSIS — M791 Myalgia, unspecified site: Secondary | ICD-10-CM

## 2023-08-25 MED ORDER — ROSUVASTATIN CALCIUM 10 MG PO TABS: 10.0000 mg | ORAL_TABLET | Freq: Every day | ORAL | 0 refills | Status: DC

## 2023-08-25 NOTE — Assessment & Plan Note (Addendum)
 Normotensive today and this has been managed with Exforge  5-160 mg which she has been taking as needed based on at-home blood pressures.  I counseled that this is not a great way to treat hypertension.  Based on her infrequent checks, the lowest she has seen is in the 100s and the highest she has seen is in the 140s.  She has also endorsed occasional lightheadedness and had a fall a few months ago that was addressed previously at an OV.  Given all of this, I have given her a BP log today to record ambulatory monitoring and told her to hold Exforge  for now pending follow-up next week.  She was instructed to call if she consistently saw readings greater than 140, otherwise we will follow-up next week to determine plan moving forward.  Check BMP today given valsartan  component and myalgia.

## 2023-08-25 NOTE — Assessment & Plan Note (Signed)
 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  This has been generally refractory to treatment with trials of duloxetine , Voltaren  gel, and short course of oxycodone .  She does not like taking medications and has even been breaking up the hydrocodone  into tiny pieces which was originally prescribed for her back pain.  Given known myalgia association with statins, particularly those that are hydrophobic such as Lipitor, will discontinue Lipitor today and start her on Crestor  10 mg daily.  Will plan to repeat lipid panel in a few months.

## 2023-08-25 NOTE — Progress Notes (Signed)
 CC: Follow-up for fibromyalgia  HPI:  [name] is a 63 y.o. female living with a history stated below and presents today for follow-up. Please see problem based assessment and plan for additional details.  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 Izola and receives occasional injections. States she was told by Dr. Unice that she does not qualify for surgery    Constipation 07/22/2018   Everitt Quervain's disease (tenosynovitis) 01/17/2019   Fibromyalgia    Functional incontinence 10/07/2019   Gastritis 10/23/2020   GERD (gastroesophageal reflux disease)    Heart murmur    yrs ago, heart murmur gone now   History of kidney stones    Hyperlipidemia    Hypertension    Jaundice    AGE 40 OR 21, food poisioning   Medial epicondylitis of elbow, left 03/13/2020   Metatarsal deformity 02/01/2014   MVP (mitral valve prolapse)    Piriformis syndrome of right side 01/09/2017   Plantar fasciitis, bilateral 07/22/2018   Postmenopausal bleeding 12/31/2016   Pronation deformity of ankle, acquired 02/01/2014   Stable angina (HCC)    per pt no chest pain since 2017   Status post right foot surgery 04/12/2014   Superficial burn 07/08/2019   Urinary incontinence 10/13/2018   Wears glasses    Wears partial dentures    UPPER    Current Outpatient Medications on File Prior to Visit  Medication Sig Dispense Refill   acetaminophen  (TYLENOL ) 500 MG tablet Take 500 mg by mouth every 6 (six) hours as needed.     amLODipine -valsartan  (EXFORGE ) 5-160 MG tablet TAKE 1 TABLET BY MOUTH DAILY 30 tablet 2   cholecalciferol  (VITAMIN D3) 25 MCG (1000 UNIT) tablet Take 1 tablet (1,000 Units total) by mouth daily. 90 tablet 3   diclofenac  Sodium (VOLTAREN ) 1 % GEL Apply 4 g topically 4 (four) times daily. 350 g 15    methocarbamol  (ROBAXIN -750) 750 MG tablet Take 1 tablet (750 mg total) by mouth 4 (four) times daily. 120 tablet 1   omeprazole  (PRILOSEC) 20 MG capsule Take 1 capsule (20 mg total) by mouth daily. 90 capsule 0   No current facility-administered medications on file prior to visit.    Family History  Problem Relation Age of Onset   Diabetes Mother    Stroke Mother    Stomach cancer Mother    Esophageal cancer Mother    Cancer Mother    Cancer Father    Hypertension Father    Pancreatic cancer Brother    Breast cancer Cousin    Colon cancer Maternal Uncle    Colon polyps Neg Hx    Rectal cancer Neg Hx     Social History   Socioeconomic History   Marital status: Single    Spouse name: Not on file   Number of children: Not on file   Years of education: Not on file   Highest education level: Not on file  Occupational History   Not on file  Tobacco Use   Smoking status: Never   Smokeless tobacco: Never  Vaping Use   Vaping status: Never Used  Substance and Sexual Activity   Alcohol use: No    Alcohol/week: 0.0 standard drinks of alcohol   Drug use: No   Sexual activity: Not Currently    Birth  control/protection: Post-menopausal  Other Topics Concern   Not on file  Social History Narrative   Not on file   Social Drivers of Health   Financial Resource Strain: High Risk (08/25/2023)   Overall Financial Resource Strain (CARDIA)    Difficulty of Paying Living Expenses: Hard  Food Insecurity: Food Insecurity Present (08/25/2023)   Hunger Vital Sign    Worried About Running Out of Food in the Last Year: Sometimes true    Ran Out of Food in the Last Year: Sometimes true  Transportation Needs: No Transportation Needs (08/25/2023)   PRAPARE - Administrator, Civil Service (Medical): No    Lack of Transportation (Non-Medical): No  Physical Activity: Unknown (08/25/2023)   Exercise Vital Sign    Days of Exercise per Week: Patient declined    Minutes of Exercise  per Session: 20 min  Stress: Stress Concern Present (08/25/2023)   [employer] of Occupational Health - Occupational Stress Questionnaire    Feeling of Stress: Rather much  Social Connections: Socially Isolated (08/25/2023)   Social Connection and Isolation Panel    Frequency of Communication with Friends and Family: More than three times a week    Frequency of Social Gatherings with Friends and Family: Twice a week    Attends Religious Services: Patient declined    Database administrator or Organizations: No    Attends Banker Meetings: Never    Marital Status: Never married  Intimate Partner Violence: Not At Risk (08/25/2023)   Humiliation, Afraid, Rape, and Kick questionnaire    Fear of Current or Ex-Partner: No    Emotionally Abused: No    Physically Abused: No    Sexually Abused: No    Review of Systems: ROS negative except for what is noted on the assessment and plan.  Vitals:   08/25/23 0955  BP: 121/79  Pulse: 78  Temp: 97.8 F (36.6 C)  TempSrc: Oral  SpO2: 99%  Weight: 192 lb (87.1 kg)  Height: 5' 7 (1.702 m)    Physical Exam: Constitutional: well-appearing, sitting in chair, in no acute distress Cardiovascular: regular rate and rhythm, no m/r/g Pulmonary/Chest: normal work of breathing on room air, lungs clear to auscultation bilaterally MSK: normal bulk and tone Skin: warm and dry Psych: normal mood and behavior  Assessment & Plan:     Patient discussed with Dr. 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  This has been generally refractory to treatment with trials of duloxetine , Voltaren  gel, and short course of oxycodone .  She does not like taking medications and has even been breaking up the hydrocodone  into tiny pieces which was originally prescribed for her back pain.  Given known myalgia association with statins, particularly those that are hydrophobic such as Lipitor, will discontinue Lipitor today and  start her on Crestor  10 mg daily.  Will plan to repeat lipid panel in a few months.  Hypertension Normotensive today and this has been managed with Exforge  5-160 mg which she has been taking as needed based on at-home blood pressures.  I counseled that this is not a great way to treat hypertension.  Based on her infrequent checks, the lowest she has seen is in the 100s and the highest she has seen is in the 140s.  She has also endorsed occasional lightheadedness and had a fall a few months ago that was addressed previously at an OV.  Given all of this, I have given her a BP log today to  record ambulatory monitoring and told her to hold Exforge  for now pending follow-up next week.  She was instructed to call if she consistently saw readings greater than 140, otherwise we will follow-up next week to determine plan moving forward.  Check BMP today given valsartan  component and myalgia.   Norman Lobstein, D.O. [facility] Internal Medicine, PGY-2 Phone: [phone] Date 08/25/2023 Time 8:22 PM

## 2023-08-26 ENCOUNTER — Ambulatory Visit: Payer: Self-pay | Admitting: Student

## 2023-08-26 LAB — BASIC METABOLIC PANEL WITH GFR
BUN/Creatinine Ratio: 15 (ref 12–28)
BUN: 12 mg/dL (ref 8–27)
CO2: 22 mmol/L (ref 20–29)
Calcium: 9.8 mg/dL (ref 8.7–10.3)
Chloride: 104 mmol/L (ref 96–106)
Creatinine, Ser: 0.82 mg/dL (ref 0.57–1.00)
Glucose: 88 mg/dL (ref 70–99)
Potassium: 4.2 mmol/L (ref 3.5–5.2)
Sodium: 142 mmol/L (ref 134–144)
eGFR: 80 mL/min/1.73 (ref >59)

## 2023-08-27 NOTE — Progress Notes (Signed)
 Internal Medicine Clinic Attending  Case discussed with the resident at the time of the visit.  We reviewed the resident's history and exam and pertinent patient test results.  I agree with the assessment, diagnosis, and plan of care documented in the resident's note.

## 2023-09-01 ENCOUNTER — Ambulatory Visit (INDEPENDENT_AMBULATORY_CARE_PROVIDER_SITE_OTHER): Admitting: Student

## 2023-09-01 VITALS — BP 134/83 | HR 78 | Temp 97.9°F | Ht 67.0 in | Wt 192.4 lb

## 2023-09-01 DIAGNOSIS — F418 Other specified anxiety disorders: Secondary | ICD-10-CM

## 2023-09-01 DIAGNOSIS — R413 Other amnesia: Secondary | ICD-10-CM

## 2023-09-01 DIAGNOSIS — I10 Essential (primary) hypertension: Secondary | ICD-10-CM

## 2023-09-01 MED ORDER — SERTRALINE HCL 25 MG PO TABS: 25.0000 mg | ORAL_TABLET | Freq: Every day | ORAL | 1 refills | Status: AC

## 2023-09-01 NOTE — Progress Notes (Unsigned)
 CC: Follow-up  HPI:  [name] is a 63 y.o. female living with a history stated below and presents today for follow-up. Please see problem based assessment and plan for additional details.  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 Izola and receives occasional injections. States she was told by Dr. Unice that she does not qualify for surgery    Constipation 07/22/2018   Everitt Quervain's disease (tenosynovitis) 01/17/2019   Fibromyalgia    Functional incontinence 10/07/2019   Gastritis 10/23/2020   GERD (gastroesophageal reflux disease)    Heart murmur    yrs ago, heart murmur gone now   History of kidney stones    Hyperlipidemia    Hypertension    Jaundice    AGE 36 OR 21, food poisioning   Medial epicondylitis of elbow, left 03/13/2020   Metatarsal deformity 02/01/2014   MVP (mitral valve prolapse)    Piriformis syndrome of right side 01/09/2017   Plantar fasciitis, bilateral 07/22/2018   Postmenopausal bleeding 12/31/2016   Pronation deformity of ankle, acquired 02/01/2014   Stable angina (HCC)    per pt no chest pain since 2017   Status post right foot surgery 04/12/2014   Superficial burn 07/08/2019   Urinary incontinence 10/13/2018   Wears glasses    Wears partial dentures    UPPER    Current Outpatient Medications on File Prior to Visit  Medication Sig Dispense Refill   acetaminophen  (TYLENOL ) 500 MG tablet Take 500 mg by mouth every 6 (six) hours as needed.     amLODipine -valsartan  (EXFORGE ) 5-160 MG tablet TAKE 1 TABLET BY MOUTH DAILY 30 tablet 2   cholecalciferol  (VITAMIN D3) 25 MCG (1000 UNIT) tablet Take 1 tablet (1,000 Units total) by mouth daily. 90 tablet 3   diclofenac  Sodium (VOLTAREN ) 1 % GEL Apply 4 g topically 4 (four) times daily. 350 g 15   methocarbamol   (ROBAXIN -750) 750 MG tablet Take 1 tablet (750 mg total) by mouth 4 (four) times daily. 120 tablet 1   omeprazole  (PRILOSEC) 20 MG capsule Take 1 capsule (20 mg total) by mouth daily. 90 capsule 0   rosuvastatin  (CRESTOR ) 10 MG tablet Take 1 tablet (10 mg total) by mouth daily. 30 tablet 0   No current facility-administered medications on file prior to visit.    Family History  Problem Relation Age of Onset   Diabetes Mother    Stroke Mother    Stomach cancer Mother    Esophageal cancer Mother    Cancer Mother    Cancer Father    Hypertension Father    Pancreatic cancer Brother    Breast cancer Cousin    Colon cancer Maternal Uncle    Colon polyps Neg Hx    Rectal cancer Neg Hx     Social History   Socioeconomic History   Marital status: Single    Spouse name: Not on file   Number of children: Not on file   Years of education: Not on file   Highest education level: Not on file  Occupational History   Not on file  Tobacco Use   Smoking status: Never   Smokeless tobacco: Never  Vaping Use   Vaping status: Never Used  Substance and Sexual Activity   Alcohol use: No    Alcohol/week: 0.0 standard  drinks of alcohol   Drug use: No   Sexual activity: Not Currently    Birth control/protection: Post-menopausal  Other Topics Concern   Not on file  Social History Narrative   Not on file   Social Drivers of Health   Financial Resource Strain: High Risk (08/25/2023)   Overall Financial Resource Strain (CARDIA)    Difficulty of Paying Living Expenses: Hard  Food Insecurity: Food Insecurity Present (08/25/2023)   Hunger Vital Sign    Worried About Running Out of Food in the Last Year: Sometimes true    Ran Out of Food in the Last Year: Sometimes true  Transportation Needs: No Transportation Needs (08/25/2023)   PRAPARE - Administrator, Civil Service (Medical): No    Lack of Transportation (Non-Medical): No  Physical Activity: Unknown (08/25/2023)   Exercise  Vital Sign    Days of Exercise per Week: Patient declined    Minutes of Exercise per Session: 20 min  Stress: Stress Concern Present (08/25/2023)   [employer] of Occupational Health - Occupational Stress Questionnaire    Feeling of Stress: Rather much  Social Connections: Socially Isolated (08/25/2023)   Social Connection and Isolation Panel    Frequency of Communication with Friends and Family: More than three times a week    Frequency of Social Gatherings with Friends and Family: Twice a week    Attends Religious Services: Patient declined    Database administrator or Organizations: No    Attends Banker Meetings: Never    Marital Status: Never married  Intimate Partner Violence: Not At Risk (08/25/2023)   Humiliation, Afraid, Rape, and Kick questionnaire    Fear of Current or Ex-Partner: No    Emotionally Abused: No    Physically Abused: No    Sexually Abused: No    Review of Systems: ROS negative except for what is noted on the assessment and plan.  Vitals:   09/01/23 0903  BP: 134/83  Pulse: 78  Temp: 97.9 F (36.6 C)  TempSrc: Oral  SpO2: 97%  Weight: 192 lb 6.4 oz (87.3 kg)  Height: 5' 7 (1.702 m)    Physical Exam: Constitutional: well-appearing, sitting in chair, in no acute distress Cardiovascular: regular rate and rhythm, no m/r/g Pulmonary/Chest: normal work of breathing on room air, lungs clear to auscultation bilaterally Skin: warm and dry Psych: normal mood and behavior  Assessment & Plan:     Patient discussed with Dr. Shawn  Hypertension Here for follow-up after Exforge  5-160 mg daily was held at last OV 8/12 while determining continued need.  BP is 134/83 and ambulatory readings since 8/12 are consistently below 130.  Given this and history of lightheadedness/falls, will continue recommendation to discontinue Exforge  for now.  She continues to work on lifestyle modifications which seem to be helping as well.  I encouraged her to  continue ambulatory readings in the meantime.  Memory loss She is endorsing short-term memory loss which she first noticed a few years ago and seems to be worse over the last few months.  Memory loss centers around walking into her room and forgetting what she was doing, she has recently left the stove on a few times.  Denies getting lost while driving in familiar areas but feels as if she can see this on the horizon because she feels a little confused while driving at times.  Mini-Cog today was perfect.  No family history of Alzheimer's/dementia.  I inquired about mood related symptoms and  she does endorse a depressed mood.  PHQ-9 is 14, GAD-7 is 10. She has felt down for a while. I discussed how depression can affect memory and we have agreed to start Zoloft  25 mg daily and reevaluate at follow-up.  I also gave her the option to meet with behavioral health which she is deferring today.  Will follow-up in 3 months.  Depression with anxiety See plan for memory loss above. Denies SI/HI.    Norman Lobstein, D.O. [facility], PGY-2 Phone: [phone] Date 09/02/2023 Time 8:19 AM

## 2023-09-02 DIAGNOSIS — F418 Other specified anxiety disorders: Secondary | ICD-10-CM | POA: Insufficient documentation

## 2023-09-02 DIAGNOSIS — R413 Other amnesia: Secondary | ICD-10-CM | POA: Insufficient documentation

## 2023-09-02 NOTE — Assessment & Plan Note (Signed)
 See plan for memory loss above. Denies SI/HI.

## 2023-09-02 NOTE — Assessment & Plan Note (Signed)
 She is endorsing short-term memory loss which she first noticed a few years ago and seems to be worse over the last few months.  Memory loss centers around walking into her room and forgetting what she was doing, she has recently left the stove on a few times.  Denies getting lost while driving in familiar areas but feels as if she can see this on the horizon because she feels a little confused while driving at times.  Mini-Cog today was perfect.  No family history of Alzheimer's/dementia.  I inquired about mood related symptoms and she does endorse a depressed mood.  PHQ-9 is 14, GAD-7 is 10. She has felt down for a while. I discussed how depression can affect memory and we have agreed to start Zoloft  25 mg daily and reevaluate at follow-up.  I also gave her the option to meet with behavioral health which she is deferring today.  Will follow-up in 3 months.

## 2023-09-02 NOTE — Assessment & Plan Note (Signed)
 Here for follow-up after Exforge  5-160 mg daily was held at last OV 8/12 while determining continued need.  BP is 134/83 and ambulatory readings since 8/12 are consistently below 130.  Given this and history of lightheadedness/falls, will continue recommendation to discontinue Exforge  for now.  She continues to work on lifestyle modifications which seem to be helping as well.  I encouraged her to continue ambulatory readings in the meantime.

## 2023-09-02 NOTE — Progress Notes (Signed)
 Internal Medicine Clinic Attending  Case discussed with the resident at the time of the visit.  We reviewed the resident's history and exam and pertinent patient test results.  I agree with the assessment, diagnosis, and plan of care documented in the resident's note.

## 2023-09-16 ENCOUNTER — Ambulatory Visit (HOSPITAL_BASED_OUTPATIENT_CLINIC_OR_DEPARTMENT_OTHER)
Admission: RE | Admit: 2023-09-16 | Discharge: 2023-09-16 | Disposition: A | Discharge status: Home / Self Care | Source: Ambulatory Visit | Attending: Internal Medicine | Admitting: Internal Medicine

## 2023-09-16 DIAGNOSIS — E559 Vitamin D deficiency, unspecified: Secondary | ICD-10-CM | POA: Insufficient documentation

## 2023-10-02 ENCOUNTER — Other Ambulatory Visit: Payer: Self-pay | Admitting: Student

## 2023-11-12 ENCOUNTER — Other Ambulatory Visit: Payer: Self-pay

## 2023-11-12 NOTE — Telephone Encounter (Signed)
 Medication sent to pharmacy

## 2023-12-02 ENCOUNTER — Ambulatory Visit: Admitting: Student

## 2024-01-15 ENCOUNTER — Other Ambulatory Visit
